# Patient Record
Sex: Male | Born: 1945 | Race: Black or African American | Hispanic: No | Marital: Single | Smoking: Former smoker
Health system: Southern US, Community
[De-identification: ages and names within clinical notes are randomized; demographics above are authoritative.]

## PROBLEM LIST (undated history)

## (undated) DIAGNOSIS — J439 Emphysema, unspecified: Secondary | ICD-10-CM

## (undated) DIAGNOSIS — R0609 Other forms of dyspnea: Secondary | ICD-10-CM

## (undated) DIAGNOSIS — K635 Polyp of colon: Secondary | ICD-10-CM

## (undated) DIAGNOSIS — I82409 Acute embolism and thrombosis of unspecified deep veins of unspecified lower extremity: Secondary | ICD-10-CM

## (undated) DIAGNOSIS — G4730 Sleep apnea, unspecified: Secondary | ICD-10-CM

## (undated) DIAGNOSIS — I10 Essential (primary) hypertension: Secondary | ICD-10-CM

## (undated) DIAGNOSIS — K219 Gastro-esophageal reflux disease without esophagitis: Secondary | ICD-10-CM

## (undated) DIAGNOSIS — M1990 Unspecified osteoarthritis, unspecified site: Secondary | ICD-10-CM

## (undated) DIAGNOSIS — R0600 Dyspnea, unspecified: Secondary | ICD-10-CM

## (undated) DIAGNOSIS — S3992XA Unspecified injury of lower back, initial encounter: Secondary | ICD-10-CM

## (undated) DIAGNOSIS — T148XXA Other injury of unspecified body region, initial encounter: Secondary | ICD-10-CM

## (undated) HISTORY — PX: HERNIA REPAIR: SHX51

## (undated) HISTORY — DX: Other forms of dyspnea: R06.09

## (undated) HISTORY — DX: Emphysema, unspecified: J43.9

## (undated) HISTORY — DX: Unspecified injury of lower back, initial encounter: S39.92XA

## (undated) HISTORY — DX: Polyp of colon: K63.5

## (undated) HISTORY — DX: Dyspnea, unspecified: R06.00

## (undated) HISTORY — DX: Gastro-esophageal reflux disease without esophagitis: K21.9

## (undated) HISTORY — PX: KNEE SURGERY: SHX244

## (undated) HISTORY — DX: Other injury of unspecified body region, initial encounter: T14.8XXA

## (undated) HISTORY — DX: Acute embolism and thrombosis of unspecified deep veins of unspecified lower extremity: I82.409

## (undated) HISTORY — PX: ROTATOR CUFF REPAIR: SHX139

## (undated) HISTORY — PX: NECK SURGERY: SHX720

---

## 2006-02-19 HISTORY — PX: COLONOSCOPY: SHX5424

## 2009-09-14 ENCOUNTER — Ambulatory Visit: Payer: Self-pay

## 2013-06-25 ENCOUNTER — Ambulatory Visit: Payer: Self-pay | Admitting: Family Medicine

## 2013-06-26 DIAGNOSIS — E669 Obesity, unspecified: Secondary | ICD-10-CM | POA: Insufficient documentation

## 2015-09-07 ENCOUNTER — Ambulatory Visit
Admission: RE | Admit: 2015-09-07 | Discharge: 2015-09-07 | Disposition: A | Discharge status: Home / Self Care | Payer: Medicare Other | Source: Ambulatory Visit | Attending: Internal Medicine | Admitting: Internal Medicine

## 2015-09-07 ENCOUNTER — Other Ambulatory Visit: Payer: Self-pay | Admitting: Internal Medicine

## 2015-09-07 DIAGNOSIS — T1490 Injury, unspecified: Secondary | ICD-10-CM | POA: Diagnosis not present

## 2015-09-07 DIAGNOSIS — IMO0001 Reserved for inherently not codable concepts without codable children: Secondary | ICD-10-CM

## 2015-09-07 DIAGNOSIS — M160 Bilateral primary osteoarthritis of hip: Secondary | ICD-10-CM | POA: Diagnosis not present

## 2015-09-07 DIAGNOSIS — X58XXXA Exposure to other specified factors, initial encounter: Secondary | ICD-10-CM | POA: Insufficient documentation

## 2016-01-20 ENCOUNTER — Ambulatory Visit
Admission: RE | Admit: 2016-01-20 | Discharge: 2016-01-20 | Disposition: A | Discharge status: Home / Self Care | Payer: Medicare Other | Source: Ambulatory Visit | Attending: Internal Medicine | Admitting: Internal Medicine

## 2016-01-20 ENCOUNTER — Other Ambulatory Visit: Payer: Self-pay | Admitting: Internal Medicine

## 2016-01-20 DIAGNOSIS — F17200 Nicotine dependence, unspecified, uncomplicated: Secondary | ICD-10-CM

## 2016-01-20 DIAGNOSIS — Z87891 Personal history of nicotine dependence: Secondary | ICD-10-CM | POA: Diagnosis present

## 2017-01-23 ENCOUNTER — Encounter: Payer: Self-pay | Admitting: *Deleted

## 2017-01-28 ENCOUNTER — Ambulatory Visit: Payer: Self-pay | Admitting: General Surgery

## 2017-01-30 ENCOUNTER — Encounter: Payer: Self-pay | Admitting: General Surgery

## 2017-01-30 ENCOUNTER — Ambulatory Visit (INDEPENDENT_AMBULATORY_CARE_PROVIDER_SITE_OTHER): Payer: Medicare Other | Admitting: General Surgery

## 2017-01-30 VITALS — BP 152/88 | HR 77 | Resp 14 | Ht 74.0 in | Wt 275.0 lb

## 2017-01-30 DIAGNOSIS — R1013 Epigastric pain: Secondary | ICD-10-CM | POA: Diagnosis not present

## 2017-01-30 DIAGNOSIS — R1319 Other dysphagia: Secondary | ICD-10-CM

## 2017-01-30 NOTE — Progress Notes (Signed)
Patient ID: Zachary Sweeney, male   DOB: [birth year], 71 y.o.   MRN: [id number]  Chief Complaint  Patient presents with  . Other    HPI Zachary Sweeney is a 71 y.o. male.  Here for evaluation dysphagia referred by Zachary Sweeney. He states that when he tries to eat food seems to get "stuck" which makes him panic. He does admit to "stabbing " pains cental upper abdomen, lasting 5-15 minutes. Not related to certain foods. Some sore throat and itching, "scratchy" as well. He states he coughs up a lot of clear mucous worse at night. Feels like a "hole" in stomach because it is "sore". The 4 episodes this year starting 3 months ago that has been with foods like beans and bread. No weight loss. He does cough on a daily basis but not related to meals. Short of breath with exertion, Zachary Sweeney tried inhalers. The patient reports that he began making use of Mobic, regular basis about the time that he began to develop his episodic abdominal pain. The patient was not able to pinpoint any particular foods or activities that may precipitate this sharp epigastric pain.  HPI  Past Medical History:  Diagnosis Date  . Back injury   . Colon polyp   . Torn ligament     Past Surgical History:  Procedure Laterality Date  . COLONOSCOPY  2008  . HERNIA REPAIR    . KNEE SURGERY Left   . NECK SURGERY    . ROTATOR CUFF REPAIR Right     Family History  Problem Relation Age of Onset  . Lung cancer Mother   . Heart disease Father   . Colon cancer Maternal Uncle     Social History Social History   Tobacco Use  . Smoking status: Former Smoker    Packs/day: 1.00    Years: 25.00    Pack years: 25.00    Types: Cigarettes    Last attempt to quit: 02/19/1994    Years since quitting: 22.9  . Smokeless tobacco: Never Used  Substance Use Topics  . Alcohol use: No    Frequency: Never  . Drug use: No    No Known Allergies  Current Outpatient Medications  Medication Sig Dispense Refill  . calcium carbonate  (TUMS - DOSED IN MG ELEMENTAL CALCIUM) 500 MG chewable tablet Chew 1 tablet by mouth as needed for indigestion or heartburn.    . diltiazem (CARDIZEM CD) 300 MG 24 hr capsule Take 300 mg by mouth daily.     . meloxicam (MOBIC) 15 MG tablet Take 15 mg by mouth daily.      No current facility-administered medications for this visit.     Review of Systems Review of Systems  Constitutional: Negative.   Respiratory: Positive for cough and shortness of breath.   Cardiovascular: Negative.   Gastrointestinal: Negative for constipation, diarrhea and nausea.    Blood pressure (!) 152/88, pulse 77, resp. rate 14, height 6\' 2"  (1.88 m), weight 275 lb (124.7 kg). The patient's weight is down 8 pounds from his September 2018 exam with his PCP.  Physical Exam Physical Exam  Constitutional: He is oriented to person, place, and time. He appears well-developed and well-nourished.  HENT:  Mouth/Throat: Oropharynx is clear and moist.  Eyes: Conjunctivae are normal. No scleral icterus.  Neck: Neck supple.  Cardiovascular: Normal rate, regular rhythm and normal heart sounds.  Pulses:      Femoral pulses are 2+ on the right side, and 2+ on  the left side. No lower leg edema  Pulmonary/Chest: Effort normal and breath sounds normal.  Abdominal: Soft. There is no tenderness.  Lymphadenopathy:    He has no cervical adenopathy.  Neurological: He is alert and oriented to person, place, and time.  Skin: Skin is warm and dry.  Psychiatric: His behavior is normal.    Data Reviewed Laboratory studies dated 11/05/2016 showed anonfasting blood sugar of 125, creatinine 1.26, estimated GFR 67, electrolytes notable for mild depression of the carbon dioxide 18. Hemoglobin A1c 5.5. PSA 3.1. HIV screen negative.  Office notes of 11/06/2006 report a weight of 283 pounds. No complaints of abdominal pain at that time. Trace lower extremity edema noted.  Colonoscopy of 07/17/2011 completed at [facility] identified 7 polyps  between the ascending colon and splenic flexure ranging between 4 and 6 mm in size. Diagnosis: A: Colon, ascending, biopsy -Adenomatous polyp (multiple fragments) -No high grade dysplasia identified  B: Colon, cecum, biopsy -Adenomatous polyp (multiple fragments) -No high grade dysplasia identified  C: Colon, sigmoid and rectum, biopsy -Hyperplastic polyp (3 fragments) -Lymphoid nodule (1 fragment)  CT of the abdomen dated 06/25/2013 showed evidence of sigmoid diverticulosis.  Assessment    Unexplained epigastric pain, esophageal spasm versus reflux versus ulcer.  Multiple colonic polyps in 2013. Follow-up exam would be due now.    Plan         Barium swallow and evaluate gall bladder  Consider upper and lower endoscopy based on the results of the above-mentioned studies.    HPI, Physical Exam, Assessment and Plan have been scribed under the direction and in the presence of Robert Bellow, MD. Karie Fetch, RN  I have completed the exam and reviewed the above documentation for accuracy and completeness.  I agree with the above.  Haematologist has been used and any errors in dictation or transcription are unintentional.  Hervey Ard, M.D., F.A.C.S.  Robert Bellow 01/31/2017, 10:04 PM  Patient has been scheduled for a right upper quadrant abdominal ultrasound and upper GI (no KUB or SBFT) at [facility] for 02-05-17 at 9:30 am (arrive 9:15 am). Prep: nothing to eat/drink 8 hours prior. This patient is aware of date, time, and instructions. He verbalizes understanding.   Dominga Ferry, CMA

## 2017-01-30 NOTE — Patient Instructions (Signed)
The patient is aware to call back for any questions or concerns.  

## 2017-01-31 DIAGNOSIS — R1013 Epigastric pain: Secondary | ICD-10-CM | POA: Insufficient documentation

## 2017-01-31 DIAGNOSIS — R1319 Other dysphagia: Secondary | ICD-10-CM | POA: Insufficient documentation

## 2017-01-31 DIAGNOSIS — R1310 Dysphagia, unspecified: Secondary | ICD-10-CM | POA: Insufficient documentation

## 2017-02-05 ENCOUNTER — Telehealth: Payer: Self-pay | Admitting: *Deleted

## 2017-02-05 ENCOUNTER — Ambulatory Visit
Admission: RE | Admit: 2017-02-05 | Discharge: 2017-02-05 | Disposition: A | Discharge status: Home / Self Care | Payer: Medicare Other | Source: Ambulatory Visit | Attending: General Surgery | Admitting: General Surgery

## 2017-02-05 DIAGNOSIS — K219 Gastro-esophageal reflux disease without esophagitis: Secondary | ICD-10-CM | POA: Diagnosis not present

## 2017-02-05 DIAGNOSIS — K760 Fatty (change of) liver, not elsewhere classified: Secondary | ICD-10-CM | POA: Insufficient documentation

## 2017-02-05 DIAGNOSIS — R1013 Epigastric pain: Secondary | ICD-10-CM | POA: Diagnosis present

## 2017-02-05 DIAGNOSIS — R1319 Other dysphagia: Secondary | ICD-10-CM | POA: Insufficient documentation

## 2017-02-05 DIAGNOSIS — K449 Diaphragmatic hernia without obstruction or gangrene: Secondary | ICD-10-CM | POA: Insufficient documentation

## 2017-02-05 NOTE — Telephone Encounter (Signed)
-----   Message from Robert Bellow, MD sent at 02/05/2017  2:15 PM EST -----  Please notify the patient the U/S was fine, but the UGI shows a mild narrowing in the esophagus. 2013 colonoscopy at [facility] showed multiple polyps.  I would recommend an upper endoscopy and possible stretch of the narrowed area. A colonoscopy would be due now based on his last exam.   These can be completed here or at [facility].  ----- Message ----- From: Interface, Rad Results In Sent: 02/05/2017  10:39 AM To: Robert Bellow, MD

## 2017-02-07 NOTE — Telephone Encounter (Signed)
Notified patient as instructed, patient pleased. He wishes to proceed with the upper endo and colonoscopy here with Dr Bary Castilla( he does not like driving to [facility] anymore if possible) . He is aware that we would be calling him after Christmas to arrange, pt agrees.

## 2017-02-20 ENCOUNTER — Other Ambulatory Visit: Payer: Self-pay | Admitting: General Surgery

## 2017-02-20 ENCOUNTER — Telehealth: Payer: Self-pay | Admitting: *Deleted

## 2017-02-20 DIAGNOSIS — R4702 Dysphasia: Secondary | ICD-10-CM

## 2017-02-20 MED ORDER — POLYETHYLENE GLYCOL 3350 17 GM/SCOOP PO POWD: ORAL | 0 refills | Status: DC

## 2017-02-20 NOTE — Telephone Encounter (Signed)
Patient has been scheduled for an upper and lower endoscopy on 02-27-17 at [facility]. The patient has been asked to only take diltiazem the morning of procedure at 6 am with a small sip of water. Miralax prescription has been sent in to the patient's pharmacy today. Colonoscopy instructions have been reviewed with the patient verbally. Also, mailed to the patient today.  This patient is aware to call the office if they have further questions.

## 2017-02-27 ENCOUNTER — Encounter: Payer: Self-pay | Admitting: Emergency Medicine

## 2017-02-27 ENCOUNTER — Ambulatory Visit: Payer: Medicare Other | Admitting: Anesthesiology

## 2017-02-27 ENCOUNTER — Ambulatory Visit
Admission: RE | Admit: 2017-02-27 | Discharge: 2017-02-27 | Disposition: A | Discharge status: Home / Self Care | Payer: Medicare Other | Source: Ambulatory Visit | Attending: General Surgery | Admitting: General Surgery

## 2017-02-27 ENCOUNTER — Encounter
Admission: RE | Disposition: A | Discharge status: Home / Self Care | Payer: Self-pay | Source: Ambulatory Visit | Attending: General Surgery

## 2017-02-27 DIAGNOSIS — K222 Esophageal obstruction: Secondary | ICD-10-CM | POA: Insufficient documentation

## 2017-02-27 DIAGNOSIS — D128 Benign neoplasm of rectum: Secondary | ICD-10-CM | POA: Diagnosis not present

## 2017-02-27 DIAGNOSIS — K449 Diaphragmatic hernia without obstruction or gangrene: Secondary | ICD-10-CM | POA: Diagnosis not present

## 2017-02-27 DIAGNOSIS — D120 Benign neoplasm of cecum: Secondary | ICD-10-CM | POA: Insufficient documentation

## 2017-02-27 DIAGNOSIS — Z1211 Encounter for screening for malignant neoplasm of colon: Secondary | ICD-10-CM | POA: Diagnosis not present

## 2017-02-27 DIAGNOSIS — R1310 Dysphagia, unspecified: Secondary | ICD-10-CM | POA: Diagnosis not present

## 2017-02-27 DIAGNOSIS — Z86010 Personal history of colonic polyps: Secondary | ICD-10-CM | POA: Diagnosis not present

## 2017-02-27 DIAGNOSIS — K621 Rectal polyp: Secondary | ICD-10-CM | POA: Diagnosis not present

## 2017-02-27 DIAGNOSIS — R4702 Dysphasia: Secondary | ICD-10-CM

## 2017-02-27 DIAGNOSIS — Z87891 Personal history of nicotine dependence: Secondary | ICD-10-CM | POA: Diagnosis not present

## 2017-02-27 HISTORY — PX: COLONOSCOPY WITH PROPOFOL: SHX5780

## 2017-02-27 HISTORY — PX: ESOPHAGOGASTRODUODENOSCOPY (EGD) WITH PROPOFOL: SHX5813

## 2017-02-27 SURGERY — ESOPHAGOGASTRODUODENOSCOPY (EGD) WITH PROPOFOL
Anesthesia: General

## 2017-02-27 MED ORDER — OMEPRAZOLE 40 MG PO CPDR: 40.0000 mg | DELAYED_RELEASE_CAPSULE | Freq: Every day | ORAL | 5 refills | Status: DC

## 2017-02-27 MED ORDER — PROPOFOL 500 MG/50ML IV EMUL
INTRAVENOUS | Status: AC
  Filled 2017-02-27: qty 50

## 2017-02-27 MED ORDER — PROPOFOL 500 MG/50ML IV EMUL
INTRAVENOUS | Status: DC | PRN
  Administered 2017-02-27: 125 ug/kg/min via INTRAVENOUS

## 2017-02-27 MED ORDER — PROPOFOL 10 MG/ML IV BOLUS
INTRAVENOUS | Status: DC | PRN
  Administered 2017-02-27: 70 mg via INTRAVENOUS

## 2017-02-27 MED ORDER — SODIUM CHLORIDE 0.9 % IV SOLN
INTRAVENOUS | Status: DC | PRN
  Administered 2017-02-27: 10:00:00 via INTRAVENOUS

## 2017-02-27 MED ORDER — LIDOCAINE HCL (CARDIAC) 20 MG/ML IV SOLN
INTRAVENOUS | Status: DC | PRN
  Administered 2017-02-27: 50 mg via INTRAVENOUS

## 2017-02-27 MED ORDER — SODIUM CHLORIDE 0.9 % IV SOLN
INTRAVENOUS | Status: DC
  Administered 2017-02-27: 1000 mL via INTRAVENOUS

## 2017-02-27 NOTE — Anesthesia Post-op Follow-up Note (Signed)
Anesthesia QCDR form completed.        

## 2017-02-27 NOTE — Op Note (Signed)
[facility] Gastroenterology Patient Name: Zachary Sweeney Procedure Date: 02/27/2017 9:22 AM MRN: [id number] Account #: [id number] Date of Birth: 05-23-1945 Admit Type: Outpatient Age: 72 Room: [facility] ENDO ROOM 1 Gender: Male Note Status: Finalized Procedure:            Colonoscopy Indications:          High risk colon cancer surveillance: Personal history                        of colonic polyps Providers:            Robert Bellow, MD Medicines:            Monitored Anesthesia Care Complications:        No immediate complications. Procedure:            Pre-Anesthesia Assessment:                       - Prior to the procedure, a History and Physical was                        performed, and patient medications, allergies and                        sensitivities were reviewed. The patient's tolerance of                        previous anesthesia was reviewed.                       - The risks and benefits of the procedure and the                        sedation options and risks were discussed with the                        patient. All questions were answered and informed                        consent was obtained.                       After obtaining informed consent, the colonoscope was                        passed under direct vision. Throughout the procedure,                        the patient's blood pressure, pulse, and oxygen                        saturations were monitored continuously. The                        Colonoscope was introduced through the anus and                        advanced to the the terminal ileum. The colonoscopy was                        performed without difficulty. The patient tolerated the  procedure well. The quality of the bowel preparation                        was adequate to identify polyps 6 mm and larger in size. Findings:      Two sessile polyps were found in the cecum. The polyps were 5 to  12 mm       in size. These polyps were removed with a hot snare. Resection and       retrieval were complete.      Two sessile polyps were found in the rectum (benign-appearing lesion).       The polyps were 6 to 8 mm in size. These polyps were removed with a cold       snare. Resection and retrieval were complete.      The retroflexed view of the distal rectum and anal verge was normal and       showed no anal or rectal abnormalities. Impression:           - Two 5 to 12 mm polyps in the cecum, removed with a                        hot snare. Resected and retrieved.                       - Two benign appearing 6 to 8 mm polyps in the rectum,                        removed with a cold snare. Resected and retrieved.                       - The distal rectum and anal verge are normal on                        retroflexion view. Recommendation:       - Return to GI clinic in 2 weeks. Procedure Code(s):    --- Professional ---                       [phone], Colonoscopy, flexible; with removal of tumor(s),                        polyp(s), or other lesion(s) by snare technique Diagnosis Code(s):    --- Professional ---                       K62.1, Rectal polyp                       D12.0, Benign neoplasm of cecum                       Z86.010, Personal history of colonic polyps CPT copyright 2016 American Medical Association. All rights reserved. The codes documented in this report are preliminary and upon coder review may  be revised to meet current compliance requirements. Robert Bellow, MD 02/27/2017 10:38:12 AM This report has been signed electronically. Number of Addenda: 0 Note Initiated On: 02/27/2017 9:22 AM Scope Withdrawal Time: 0 hours 26 minutes 14 seconds  Total Procedure Duration: 0 hours 34 minutes 52 seconds       [facility]

## 2017-02-27 NOTE — H&P (Signed)
Zachary Sweeney [id number] 1945-12-29     HPI: 72 year old male with past history of colonic polyps of the ascending colon and cecum identified in 2013.  More recent history of dysphasia, improved from his December exam.  Admitted for upper and lower endoscopy.  Patient reports tolerating the prep well.  Medications Prior to Admission  Medication Sig Dispense Refill Last Dose  . calcium carbonate (TUMS - DOSED IN MG ELEMENTAL CALCIUM) 500 MG chewable tablet Chew 1 tablet by mouth as needed for indigestion or heartburn.   02/26/2017 at Unknown time  . diltiazem (CARDIZEM CD) 300 MG 24 hr capsule Take 300 mg by mouth daily.    02/27/2017 at Unknown time  . meloxicam (MOBIC) 15 MG tablet Take 15 mg by mouth daily.    02/26/2017 at Unknown time  . polyethylene glycol powder (GLYCOLAX/MIRALAX) powder 255 grams one bottle for colonoscopy prep 255 g 0 02/26/2017 at Unknown time   No Known Allergies Past Medical History:  Diagnosis Date  . Back injury   . Colon polyp   . Torn ligament    Past Surgical History:  Procedure Laterality Date  . COLONOSCOPY  2008  . HERNIA REPAIR    . KNEE SURGERY Left   . NECK SURGERY    . ROTATOR CUFF REPAIR Right    Social History   Socioeconomic History  . Marital status: Married    Spouse name: Not on file  . Number of children: Not on file  . Years of education: Not on file  . Highest education level: Not on file  Social Needs  . Financial resource strain: Not on file  . Food insecurity - worry: Not on file  . Food insecurity - inability: Not on file  . Transportation needs - medical: Not on file  . Transportation needs - non-medical: Not on file  Occupational History  . Not on file  Tobacco Use  . Smoking status: Former Smoker    Packs/day: 1.00    Years: 25.00    Pack years: 25.00    Types: Cigarettes    Last attempt to quit: 02/19/1994    Years since quitting: 23.0  . Smokeless tobacco: Never Used  Substance and Sexual Activity  . Alcohol  use: No    Frequency: Never  . Drug use: No  . Sexual activity: Not on file  Other Topics Concern  . Not on file  Social History Narrative  . Not on file   Social History   Social History Narrative  . Not on file     ROS: Negative.     PE: HEENT: Negative. Lungs: Clear. Cardio: RR.  Assessment/Plan:  Proceed with planned upper and lower endoscopy. Robert Bellow 02/27/2017

## 2017-02-27 NOTE — Progress Notes (Signed)
The patient will be placed on Prilosec, 40 mg daily and will be reassessed in 2 weeks.

## 2017-02-27 NOTE — Anesthesia Preprocedure Evaluation (Signed)
Anesthesia Evaluation  Patient identified by MRN, date of birth, ID band Patient awake    Reviewed: Allergy & Precautions, NPO status , Patient's Chart, lab work & pertinent test results  History of Anesthesia Complications Negative for: history of anesthetic complications  Airway Mallampati: III  TM Distance: >3 FB Neck ROM: Full    Dental  (+) Edentulous Upper, Partial Lower   Pulmonary neg sleep apnea, neg COPD, former smoker,    breath sounds clear to auscultation- rhonchi (-) wheezing      Cardiovascular Exercise Tolerance: Good hypertension, (-) CAD, (-) Past MI, (-) Cardiac Stents and (-) CABG  Rhythm:Regular Rate:Normal - Systolic murmurs and - Diastolic murmurs    Neuro/Psych negative neurological ROS  negative psych ROS   GI/Hepatic negative GI ROS, Neg liver ROS,   Endo/Other  negative endocrine ROSneg diabetes  Renal/GU negative Renal ROS     Musculoskeletal negative musculoskeletal ROS (+)   Abdominal (+) + obese,   Peds  Hematology negative hematology ROS (+)   Anesthesia Other Findings Past Medical History: No date: Back injury No date: Colon polyp No date: Torn ligament   Reproductive/Obstetrics                             Anesthesia Physical Anesthesia Plan  ASA: II  Anesthesia Plan: General   Post-op Pain Management:    Induction: Intravenous  PONV Risk Score and Plan: 1 and Propofol infusion  Airway Management Planned: Natural Airway  Additional Equipment:   Intra-op Plan:   Post-operative Plan:   Informed Consent: I have reviewed the patients History and Physical, chart, labs and discussed the procedure including the risks, benefits and alternatives for the proposed anesthesia with the patient or authorized representative who has indicated his/her understanding and acceptance.   Dental advisory given  Plan Discussed with: CRNA and  Anesthesiologist  Anesthesia Plan Comments:         Anesthesia Quick Evaluation

## 2017-02-27 NOTE — Op Note (Signed)
[facility] Gastroenterology Patient Name: Zachary Sweeney Procedure Date: 02/27/2017 9:22 AM MRN: [id number] Account #: [id number] Date of Birth: 1945/12/16 Admit Type: Outpatient Age: 72 Room: [facility] ENDO ROOM 1 Gender: Male Note Status: Finalized Procedure:            Upper GI endoscopy Indications:          Dysphagia Providers:            Robert Bellow, MD Referring MD:         Mikeal Hawthorne. Brynda Greathouse MD, MD (Referring MD) Medicines:            Monitored Anesthesia Care Complications:        No immediate complications. Procedure:            Pre-Anesthesia Assessment:                       - Prior to the procedure, a History and Physical was                        performed, and patient medications, allergies and                        sensitivities were reviewed. The patient's tolerance of                        previous anesthesia was reviewed.                       - The risks and benefits of the procedure and the                        sedation options and risks were discussed with the                        patient. All questions were answered and informed                        consent was obtained.                       - Patient identification and proposed procedure were                        verified prior to the procedure by the physician.                       After obtaining informed consent, the endoscope was                        passed under direct vision. Throughout the procedure,                        the patient's blood pressure, pulse, and oxygen                        saturations were monitored continuously. The Endoscope                        was introduced through the mouth, and advanced to the  second part of duodenum. The upper GI endoscopy was                        accomplished without difficulty. The patient tolerated                        the procedure well. Findings:      A medium-sized hiatal hernia was  present.      A non-obstructing Schatzki ring (acquired) was found at the       gastroesophageal junction. Biopsies were taken with a cold forceps for       histology.      The stomach was normal.      The examined duodenum was normal. Impression:           - Medium-sized hiatal hernia.                       - Non-obstructing Schatzki ring. Biopsied.                       - Normal stomach.                       - Normal examined duodenum. Recommendation:       - Return to endoscopist in 2 weeks. Procedure Code(s):    --- Professional ---                       [phone], Esophagogastroduodenoscopy, flexible, transoral;                        with biopsy, single or multiple Diagnosis Code(s):    --- Professional ---                       K44.9, Diaphragmatic hernia without obstruction or                        gangrene                       R13.10, Dysphagia, unspecified CPT copyright 2016 American Medical Association. All rights reserved. The codes documented in this report are preliminary and upon coder review may  be revised to meet current compliance requirements. Robert Bellow, MD 02/27/2017 10:42:03 AM This report has been signed electronically. Number of Addenda: 0 Note Initiated On: 02/27/2017 9:22 AM      [facility]

## 2017-02-27 NOTE — Anesthesia Postprocedure Evaluation (Signed)
Anesthesia Post Note  Patient: Zachary Sweeney  Procedure(s) Performed: ESOPHAGOGASTRODUODENOSCOPY (EGD) WITH PROPOFOL (N/A ) COLONOSCOPY WITH PROPOFOL (N/A )  Patient location during evaluation: Endoscopy Anesthesia Type: General Level of consciousness: awake and alert and oriented Pain management: pain level controlled Vital Signs Assessment: post-procedure vital signs reviewed and stable Respiratory status: spontaneous breathing, nonlabored ventilation and respiratory function stable Cardiovascular status: blood pressure returned to baseline and stable Postop Assessment: no signs of nausea or vomiting Anesthetic complications: no     Last Vitals:  Vitals:   02/27/17 1101 02/27/17 1105  BP: (!) 116/104 116/76  Pulse: 79 75  Resp: 17 15  Temp:    SpO2: 99% 98%    Last Pain:  Vitals:   02/27/17 1101  TempSrc:   PainSc: 0-No pain                 Breanda Greenlaw

## 2017-02-27 NOTE — Transfer of Care (Signed)
Immediate Anesthesia Transfer of Care Note  Patient: Zachary Sweeney  Procedure(s) Performed: ESOPHAGOGASTRODUODENOSCOPY (EGD) WITH PROPOFOL (N/A ) COLONOSCOPY WITH PROPOFOL (N/A )  Patient Location: Endoscopy Unit  Anesthesia Type:General  Level of Consciousness: sedated  Airway & Oxygen Therapy: Patient Spontanous Breathing and Patient connected to nasal cannula oxygen  Post-op Assessment: Report given to RN and Post -op Vital signs reviewed and stable  Post vital signs: Reviewed and stable  Last Vitals:  Vitals:   02/27/17 0921 02/27/17 1041  BP: (!) 152/108   Pulse: 88   Resp: (!) 24   Temp: (!) 35.7 C (!) (P) 36.1 C  SpO2: 98%     Last Pain:  Vitals:   02/27/17 0921  TempSrc: Tympanic         Complications: No apparent anesthesia complications

## 2017-02-28 ENCOUNTER — Encounter: Payer: Self-pay | Admitting: General Surgery

## 2017-03-01 ENCOUNTER — Telehealth: Payer: Self-pay | Admitting: General Surgery

## 2017-03-01 LAB — SURGICAL PATHOLOGY

## 2017-03-01 NOTE — Telephone Encounter (Signed)
3 tubular adenomas in the colon.  Biopsies of the GE junction where the most pronounced inflammation showed eosinophilic changes.  Likely related to reflux.  Patient started on PPI.  We will follow his clinical response.  Repeat colonoscopy in 5 years.

## 2017-06-24 NOTE — Progress Notes (Addendum)
[facility] Consultation      Assessment and Plan:  Dyspnea on exertion. - With excess mucus production, and history of smoking may be indicative of COPD. - We will start empirically on Spiriva inhaler, sent patient for chest x-ray and pulmonary function testing.  Chest pain. - Occasional chest pain, though atypical and can sometimes occur at rest as well as with activity. - We will refer to cardiology as soon as possible.  GERD. - Continue omeprazole.  Right lower extremity edema. - We will obtain lower extremity ultrasound to rule out DVT.  Excessive daytime sleepiness. -Poor sleep hygiene, with symptoms and signs of obstructive sleep apnea, will send for sleep study.  Meds ordered this encounter  Medications  . tiotropium (SPIRIVA HANDIHALER) 18 MCG inhalation capsule    Sig: Place 1 capsule (18 mcg total) into inhaler and inhale daily.    Dispense:  30 capsule    Refill:  2   Orders Placed This Encounter  Procedures  . DG Chest 2 View  . US Venous Img Lower Bilateral  . Pulmonary Function Test [facility] Only  . Split night study   Return in about 2 months (around 08/25/2017).  Addendum 06/26/2017 Noted to have a positive DVT, appeared subacute.  Was sent to the ED and started on Eliquis.  Given his dyspnea on exertion, will send for CT chest to rule out pulmonary embolism.  Date: 06/25/2017  MRN# [id number] WAI LITT 1945-10-30   Zachary Sweeney is a 72 y.o. old male seen in consultation for chief complaint of:    Chief Complaint  Patient presents with  . Consult    referred by [facility] for eval of sob    HPI:   He notes that he has trouble breathing on exertion, occasional chest pain, and pain in his legs. He has a yard  About flat 200 square feet that he mows and this year he can mow only half. He runs out of breath when he does things.  He has never been diagnosed with copd or asthma. He is on no inhalers  currently. He is not smoking, he has minimal to no cough, and he denies excess mucus production.  He has reflux which is controlled with prilosec.  He has irregular sleep habits, goes to bed at 1am, but only falls asleep at 5, wakes at 9am. Then is up and takes a 2 hour nap in the afternoon.    Imaging personally reviewed, 01/20/2016; some mildly increased interstitial markings bilaterally, otherwise normal lungs.   PMHX:   Past Medical History:  Diagnosis Date  . Back injury   . Colon polyp   . Torn ligament    Surgical Hx:  Past Surgical History:  Procedure Laterality Date  . COLONOSCOPY  2008  . COLONOSCOPY WITH PROPOFOL N/A 02/27/2017   Procedure: COLONOSCOPY WITH PROPOFOL;  Surgeon: Robert Bellow, MD;  Location: [facility] ENDOSCOPY;  Service: Endoscopy;  Laterality: N/A;  . ESOPHAGOGASTRODUODENOSCOPY (EGD) WITH PROPOFOL N/A 02/27/2017   Procedure: ESOPHAGOGASTRODUODENOSCOPY (EGD) WITH PROPOFOL;  Surgeon: Robert Bellow, MD;  Location: [facility] ENDOSCOPY;  Service: Endoscopy;  Laterality: N/A;  . HERNIA REPAIR    . KNEE SURGERY Left   . NECK SURGERY    . ROTATOR CUFF REPAIR Right    Family Hx:  Family History  Problem Relation Age of Onset  . Lung cancer Mother   . Heart disease Father   . Colon cancer Maternal Uncle  Social Hx:   Social History   Tobacco Use  . Smoking status: Former Smoker    Packs/day: 1.00    Years: 25.00    Pack years: 25.00    Types: Cigarettes    Last attempt to quit: 02/19/1994    Years since quitting: 23.3  . Smokeless tobacco: Never Used  Substance Use Topics  . Alcohol use: No    Frequency: Never  . Drug use: No   Medication:    Current Outpatient Medications:  .  diltiazem (CARDIZEM CD) 300 MG 24 hr capsule, Take 300 mg by mouth daily. , Disp: , Rfl:  .  meloxicam (MOBIC) 15 MG tablet, Take 15 mg by mouth daily. , Disp: , Rfl:  .  omeprazole (PRILOSEC) 40 MG capsule, Take 1 capsule (40 mg total) by mouth daily., Disp: 30 capsule,  Rfl: 5   Allergies:  Patient has no known allergies.  Review of Systems: Gen:  Denies  fever, sweats, chills HEENT: Denies blurred vision, double vision. bleeds, sore throat Cvc:  No dizziness, chest pain. Resp:   Denies cough or sputum production, shortness of breath Gi: Denies swallowing difficulty, stomach pain. Gu:  Denies bladder incontinence, burning urine Ext:   No Joint pain, stiffness. Skin: No skin rash,  hives  Endoc:  No polyuria, polydipsia. Psych: No depression, insomnia. Other:  All other systems were reviewed with the patient and were negative other that what is mentioned in the HPI.   Physical Examination:   VS: BP 132/88 (BP Location: Left Arm, Cuff Size: Large)   Pulse (!) 53   Ht 5\' 11"  (1.803 m)   SpO2 97%   BMI 36.96 kg/m   General Appearance: No distress  Neuro:without focal findings,  speech normal,  HEENT: PERRLA, EOM intact.   Pulmonary: normal breath sounds, No wheezing.  CardiovascularNormal S1,S2.  No m/r/g.   Abdomen: Benign, Soft, non-tender. Renal:  No costovertebral tenderness  GU:  No performed at this time. Endoc: No evident thyromegaly, no signs of acromegaly. Skin:   warm, no rashes, no ecchymosis  Extremities: normal, no cyanosis, clubbing.  1-2+ right lower extremity edema.  No erythema or pain.  Other findings:    LABORATORY PANEL:   CBC No results for input(s): WBC, HGB, HCT, PLT in the last 168 hours. ------------------------------------------------------------------------------------------------------------------  Chemistries  No results for input(s): NA, K, CL, CO2, GLUCOSE, BUN, CREATININE, CALCIUM, MG, AST, ALT, ALKPHOS, BILITOT in the last 168 hours.  Invalid input(s): GFRCGP ------------------------------------------------------------------------------------------------------------------  Cardiac Enzymes No results for input(s): TROPONINI in the last 168  hours. ------------------------------------------------------------  RADIOLOGY:  No results found.     Thank  you for the consultation and for allowing [facility] to assist in the care of your patient. Our recommendations are noted above.  Please contact us if we can be of further service.   Marda Stalker, MD.  Board Certified in Internal Medicine, Pulmonary Medicine, [place], and Sleep Medicine.  [facility] Office Number: [phone]  Patricia Pesa, M.D.  Merton Border, M.D  06/25/2017

## 2017-06-25 ENCOUNTER — Emergency Department
Admission: EM | Admit: 2017-06-25 | Discharge: 2017-06-25 | Disposition: A | Discharge status: Home / Self Care | Payer: Medicare Other | Attending: Emergency Medicine | Admitting: Emergency Medicine

## 2017-06-25 ENCOUNTER — Ambulatory Visit
Admission: RE | Admit: 2017-06-25 | Discharge: 2017-06-25 | Disposition: A | Discharge status: Home / Self Care | Payer: Medicare Other | Source: Ambulatory Visit | Attending: Internal Medicine | Admitting: Internal Medicine

## 2017-06-25 ENCOUNTER — Encounter: Payer: Self-pay | Admitting: Internal Medicine

## 2017-06-25 ENCOUNTER — Encounter: Payer: Self-pay | Admitting: Emergency Medicine

## 2017-06-25 ENCOUNTER — Ambulatory Visit (INDEPENDENT_AMBULATORY_CARE_PROVIDER_SITE_OTHER): Payer: Medicare Other | Admitting: Internal Medicine

## 2017-06-25 ENCOUNTER — Other Ambulatory Visit: Payer: Self-pay

## 2017-06-25 ENCOUNTER — Telehealth: Payer: Self-pay | Admitting: *Deleted

## 2017-06-25 VITALS — BP 132/88 | HR 53 | Ht 71.0 in

## 2017-06-25 DIAGNOSIS — R0609 Other forms of dyspnea: Secondary | ICD-10-CM | POA: Diagnosis not present

## 2017-06-25 DIAGNOSIS — R079 Chest pain, unspecified: Secondary | ICD-10-CM

## 2017-06-25 DIAGNOSIS — Z87891 Personal history of nicotine dependence: Secondary | ICD-10-CM | POA: Diagnosis not present

## 2017-06-25 DIAGNOSIS — R600 Localized edema: Secondary | ICD-10-CM

## 2017-06-25 DIAGNOSIS — I82431 Acute embolism and thrombosis of right popliteal vein: Secondary | ICD-10-CM

## 2017-06-25 DIAGNOSIS — N400 Enlarged prostate without lower urinary tract symptoms: Secondary | ICD-10-CM | POA: Insufficient documentation

## 2017-06-25 DIAGNOSIS — R2241 Localized swelling, mass and lump, right lower limb: Secondary | ICD-10-CM | POA: Diagnosis present

## 2017-06-25 DIAGNOSIS — I82491 Acute embolism and thrombosis of other specified deep vein of right lower extremity: Secondary | ICD-10-CM | POA: Diagnosis not present

## 2017-06-25 DIAGNOSIS — M7989 Other specified soft tissue disorders: Secondary | ICD-10-CM | POA: Insufficient documentation

## 2017-06-25 DIAGNOSIS — G4719 Other hypersomnia: Secondary | ICD-10-CM

## 2017-06-25 DIAGNOSIS — R0600 Dyspnea, unspecified: Secondary | ICD-10-CM

## 2017-06-25 DIAGNOSIS — I10 Essential (primary) hypertension: Secondary | ICD-10-CM | POA: Diagnosis not present

## 2017-06-25 LAB — BASIC METABOLIC PANEL
ANION GAP: 7 (ref 5–15)
BUN: 14 mg/dL (ref 6–20)
CO2: 27 mmol/L (ref 22–32)
Calcium: 9.3 mg/dL (ref 8.9–10.3)
Chloride: 102 mmol/L (ref 101–111)
Creatinine, Ser: 1.13 mg/dL (ref 0.61–1.24)
GFR calc Af Amer: >60 mL/min (ref >60)
GFR calc non Af Amer: >60 mL/min (ref >60)
GLUCOSE: 90 mg/dL (ref 65–99)
Potassium: 4.1 mmol/L (ref 3.5–5.1)
Sodium: 136 mmol/L (ref 135–145)

## 2017-06-25 LAB — CBC WITH DIFFERENTIAL/PLATELET
BASOS ABS: 0 10*3/uL (ref 0–0.1)
Basophils Relative: 0 %
Eosinophils Absolute: 0.2 10*3/uL (ref 0–0.7)
Eosinophils Relative: 3 %
HEMATOCRIT: 50.9 % (ref 40.0–52.0)
Hemoglobin: 17.4 g/dL (ref 13.0–18.0)
LYMPHS PCT: 31 %
Lymphs Abs: 2.3 10*3/uL (ref 1.0–3.6)
MCH: 30.5 pg (ref 26.0–34.0)
MCHC: 34.1 g/dL (ref 32.0–36.0)
MCV: 89.3 fL (ref 80.0–100.0)
MONO ABS: 0.5 10*3/uL (ref 0.2–1.0)
Monocytes Relative: 7 %
NEUTROS ABS: 4.3 10*3/uL (ref 1.4–6.5)
Neutrophils Relative %: 59 %
Platelets: 137 10*3/uL — ABNORMAL LOW (ref 150–440)
RBC: 5.7 MIL/uL (ref 4.40–5.90)
RDW: 14.1 % (ref 11.5–14.5)
WBC: 7.4 10*3/uL (ref 3.8–10.6)

## 2017-06-25 LAB — APTT: aPTT: 30 seconds (ref 24–36)

## 2017-06-25 MED ORDER — APIXABAN 5 MG PO TABS
10.0000 mg | ORAL_TABLET | Freq: Two times a day (BID) | ORAL | Status: DC
  Administered 2017-06-25: 10 mg via ORAL

## 2017-06-25 MED ORDER — ELIQUIS 5 MG VTE STARTER PACK: ORAL_TABLET | ORAL | 0 refills | Status: DC

## 2017-06-25 MED ORDER — TIOTROPIUM BROMIDE MONOHYDRATE 18 MCG IN CAPS: 18.0000 ug | ORAL_CAPSULE | Freq: Every day | RESPIRATORY_TRACT | 2 refills | Status: DC

## 2017-06-25 MED ORDER — APIXABAN 5 MG PO TABS
ORAL_TABLET | ORAL | Status: AC
  Filled 2017-06-25: qty 2

## 2017-06-25 NOTE — ED Triage Notes (Signed)
Pt reports that he has pain in his right foot. He went to get an US done today and they told him that he had blood clot.

## 2017-06-25 NOTE — ED Provider Notes (Signed)
[facility] Emergency Department Provider Note  ____________________________________________   First MD Initiated Contact with Patient 06/25/17 1916     (approximate)  I have reviewed the triage vital signs and the nursing notes.   HISTORY  Chief Complaint blood clot in right leg   HPI Zachary WAMSER is a 72 y.o. male with his history of chronic right lower extremity pain and swelling who is presenting to the emergency department with a blood clot to the right lower extremity.  He says that he finally had an ultrasound today which showed a right popliteal DVT.  He has now presented to the emergency department for further evaluation and treatment.  He says the symptoms have been ongoing for months to years.  Is not reporting any chest pain or shortness of breath at this time.  However, he says that he has had progressively worsening shortness of breath with exertion over the past several years.  However, no sudden worsening over the past several days.  Past Medical History:  Diagnosis Date  . Back injury   . Colon polyp   . Torn ligament     Patient Active Problem List   Diagnosis Date Noted  . Benign prostatic hyperplasia 06/25/2017  . Hypertension 06/25/2017  . Other dysphagia 01/31/2017  . Epigastric pain 01/31/2017  . Obesity, unspecified 06/26/2013    Past Surgical History:  Procedure Laterality Date  . COLONOSCOPY  2008  . COLONOSCOPY WITH PROPOFOL N/A 02/27/2017   Procedure: COLONOSCOPY WITH PROPOFOL;  Surgeon: Robert Bellow, MD;  Location: [facility] ENDOSCOPY;  Service: Endoscopy;  Laterality: N/A;  . ESOPHAGOGASTRODUODENOSCOPY (EGD) WITH PROPOFOL N/A 02/27/2017   Procedure: ESOPHAGOGASTRODUODENOSCOPY (EGD) WITH PROPOFOL;  Surgeon: Robert Bellow, MD;  Location: [facility] ENDOSCOPY;  Service: Endoscopy;  Laterality: N/A;  . HERNIA REPAIR    . KNEE SURGERY Left   . NECK SURGERY    . ROTATOR CUFF REPAIR Right     Prior to Admission  medications   Medication Sig Start Date End Date Taking? Authorizing Provider  diltiazem (CARDIZEM CD) 300 MG 24 hr capsule Take 300 mg by mouth daily.  01/16/17   [provider]  meloxicam (MOBIC) 15 MG tablet Take 15 mg by mouth daily.  02/22/14   [provider]  omeprazole (PRILOSEC) 40 MG capsule Take 1 capsule (40 mg total) by mouth daily. 02/27/17 02/27/18  Robert Bellow, MD  tiotropium (SPIRIVA HANDIHALER) 18 MCG inhalation capsule Place 1 capsule (18 mcg total) into inhaler and inhale daily. 06/25/17 06/25/18  Laverle Hobby, MD    Allergies Patient has no known allergies.  Family History  Problem Relation Age of Onset  . Lung cancer Mother   . Heart disease Father   . Colon cancer Maternal Uncle     Social History Social History   Tobacco Use  . Smoking status: Former Smoker    Packs/day: 1.00    Years: 25.00    Pack years: 25.00    Types: Cigarettes    Last attempt to quit: 02/19/1994    Years since quitting: 23.3  . Smokeless tobacco: Never Used  Substance Use Topics  . Alcohol use: No    Frequency: Never  . Drug use: No    Review of Systems  Constitutional: No fever/chills Eyes: No visual changes. ENT: No sore throat. Cardiovascular: As above Respiratory: As above Gastrointestinal: No abdominal pain.  No nausea, no vomiting.  No diarrhea.  No constipation. Genitourinary: Negative for dysuria. Musculoskeletal: Negative for  back pain. Skin: Negative for rash. Neurological: Negative for headaches, focal weakness or numbness.   ____________________________________________   PHYSICAL EXAM:  VITAL SIGNS: ED Triage Vitals  Enc Vitals Group     BP 06/25/17 1706 137/80     Pulse Rate 06/25/17 1706 (!) 56     Resp 06/25/17 1706 20     Temp 06/25/17 1706 98.2 F (36.8 C)     Temp Source 06/25/17 1706 Oral     SpO2 06/25/17 1706 98 %     Weight 06/25/17 1707 280 lb (127 kg)     Height 06/25/17 1707 6\' 2"  (1.88 m)     Head  Circumference --      Peak Flow --      Pain Score 06/25/17 1706 7     Pain Loc --      Pain Edu? --      Excl. in [place]? --     Constitutional: Alert and oriented. Well appearing and in no acute distress. Eyes: Conjunctivae are normal.  Head: Atraumatic. Nose: No congestion/rhinnorhea. Mouth/Throat: Mucous membranes are moist.  Neck: No stridor.   Cardiovascular: Normal rate, regular rhythm. Grossly normal heart sounds.  Good peripheral circulation. Respiratory: Normal respiratory effort.  No retractions. Lungs CTAB. Gastrointestinal: Soft and nontender. No distention.  Musculoskeletal: Moderate right lower extremity edema to the right proximal foot as well as the calf.  Bilateral dorsalis pedis pulses are present and equal. Neurologic:  Normal speech and language. No gross focal neurologic deficits are appreciated. Skin:  Skin is warm, dry and intact. No rash noted. Psychiatric: Mood and affect are normal. Speech and behavior are normal.  ____________________________________________   LABS (all labs ordered are listed, but only abnormal results are displayed)  Labs Reviewed  CBC WITH DIFFERENTIAL/PLATELET - Abnormal; Notable for the following components:      Result Value   Platelets 137 (*)    All other components within normal limits  BASIC METABOLIC PANEL  APTT   ____________________________________________  EKG   ____________________________________________  RADIOLOGY  Subacute right popliteal DVT. ____________________________________________   PROCEDURES  Procedure(s) performed:   Procedures  Critical Care performed:   ____________________________________________   INITIAL IMPRESSION / ASSESSMENT AND PLAN / ED COURSE  Pertinent labs & imaging results that were available during my care of the patient were reviewed by me and considered in my medical decision making (see chart for details).  DDX: Peripheral edema, DVT, venous stasis, cellulitis As  part of my medical decision making, I reviewed the following data within the [place] Notes from prior outpatient visits.  ----------------------------------------- 8:24 PM on 06/25/2017 -----------------------------------------  Discussed case with Dr. Lucky Cowboy of vascular surgery because of the subacute nature of the symptoms as well as the findings.  However, Dr. Lucky Cowboy recommends treatment with Eliquis.  The patient has not had treatment thus far with blood thinners.  The patient is aware of the need for the blood thinners as well as the side effects including gastrointestinal bleeding as well as the need to come to the hospital immediately for any trauma.  Patient denies any current bleeding in his stool or any past history of hemorrhage.  He is understanding of the diagnosis as well as treatment plan willing to comply.  He is also understanding of the plan to stop his meloxicam. ____________________________________________   FINAL CLINICAL IMPRESSION(S) / ED DIAGNOSES  Right lower extremity DVT.    NEW MEDICATIONS STARTED DURING THIS VISIT:  New Prescriptions   No  medications on file     Note:  This document was prepared using Dragon voice recognition software and may include unintentional dictation errors.     Orbie Pyo, MD 06/25/17 2025

## 2017-06-25 NOTE — ED Notes (Signed)
Pt discharged to home.  Family member driving.  Discharge instructions reviewed.  Verbalized understanding.  No questions or concerns at this time.  Teach back verified.  Pt in NAD.  No items left in ED.   

## 2017-06-25 NOTE — ED Notes (Signed)
Pt states he has been having pain to R foot with increased pain with walking.  Pt states he has [place] and was dx with blood clot.  Pt is A&Ox4, in NAD.  Marked x on pt's R foot where pulse found.

## 2017-06-25 NOTE — Telephone Encounter (Signed)
Spoke with Vicente Males in radiology who states pt is positive for partially occlusive DVT in the popliteal vein. It has a subacute appearance. DR informed and states to send pt to ER. Informed Vicente Males in radiology to take pt to ER. Nothing further needed.

## 2017-06-25 NOTE — Patient Instructions (Addendum)
Will send you for a lung function test, chest x-ray, lower extremity ultrasound, and sleep study.   Will refer you to cardiology for chest pain asap.

## 2017-06-26 ENCOUNTER — Telehealth: Payer: Self-pay | Admitting: Internal Medicine

## 2017-06-26 ENCOUNTER — Ambulatory Visit: Payer: Medicare Other

## 2017-06-26 ENCOUNTER — Ambulatory Visit: Payer: Medicare Other | Admitting: Internal Medicine

## 2017-06-26 MED ORDER — APIXABAN 5 MG PO TABS: 5.0000 mg | ORAL_TABLET | Freq: Two times a day (BID) | ORAL | 3 refills | Status: DC

## 2017-06-26 MED ORDER — APIXABAN 5 MG PO TABS: 5.0000 mg | ORAL_TABLET | Freq: Two times a day (BID) | ORAL | 0 refills | Status: DC

## 2017-06-26 NOTE — Telephone Encounter (Signed)
Samples given for Elliquis 5mg . Patient contacted for pick-up.

## 2017-06-26 NOTE — Addendum Note (Signed)
Addended by: Laverle Hobby on: 06/26/2017 08:54 AM   Modules accepted: Orders

## 2017-06-26 NOTE — Telephone Encounter (Signed)
RWE:[id number] EXP 3/21 SAMPLES FROM CARDIOLOGY

## 2017-06-26 NOTE — Telephone Encounter (Signed)
Pt calling stating he was released from ED late last night and was not able to get to his pharmacy in time. This morning he went to fill the blood thinner and they told him they would not have the prescription in until the morning  He is concerned for the doctor in ED told him to take a dose today  Would like a call back to see if hospital Pharmacy would have it Or if we have samples   Please call back

## 2017-06-27 ENCOUNTER — Ambulatory Visit: Admission: RE | Admit: 2017-06-27 | Payer: Medicare Other | Source: Ambulatory Visit

## 2017-06-28 ENCOUNTER — Ambulatory Visit
Admission: RE | Admit: 2017-06-28 | Discharge: 2017-06-28 | Disposition: A | Discharge status: Home / Self Care | Payer: Medicare Other | Source: Ambulatory Visit | Attending: Internal Medicine | Admitting: Internal Medicine

## 2017-06-28 DIAGNOSIS — R079 Chest pain, unspecified: Secondary | ICD-10-CM | POA: Diagnosis present

## 2017-06-28 DIAGNOSIS — J432 Centrilobular emphysema: Secondary | ICD-10-CM | POA: Insufficient documentation

## 2017-06-28 DIAGNOSIS — J438 Other emphysema: Secondary | ICD-10-CM | POA: Insufficient documentation

## 2017-06-28 DIAGNOSIS — R0609 Other forms of dyspnea: Secondary | ICD-10-CM | POA: Diagnosis present

## 2017-06-28 HISTORY — DX: Essential (primary) hypertension: I10

## 2017-06-28 MED ORDER — IOHEXOL 350 MG/ML SOLN
75.0000 mL | Freq: Once | INTRAVENOUS | Status: AC | PRN
  Administered 2017-06-28: 75 mL via INTRAVENOUS

## 2017-07-12 ENCOUNTER — Ambulatory Visit: Payer: Medicare Other | Attending: Internal Medicine

## 2017-07-12 DIAGNOSIS — G4733 Obstructive sleep apnea (adult) (pediatric): Secondary | ICD-10-CM | POA: Insufficient documentation

## 2017-07-12 DIAGNOSIS — G4761 Periodic limb movement disorder: Secondary | ICD-10-CM | POA: Insufficient documentation

## 2017-07-12 DIAGNOSIS — R0683 Snoring: Secondary | ICD-10-CM | POA: Insufficient documentation

## 2017-07-16 DIAGNOSIS — G4733 Obstructive sleep apnea (adult) (pediatric): Secondary | ICD-10-CM | POA: Diagnosis not present

## 2017-07-18 ENCOUNTER — Ambulatory Visit (INDEPENDENT_AMBULATORY_CARE_PROVIDER_SITE_OTHER): Payer: Medicare Other | Admitting: Internal Medicine

## 2017-07-18 ENCOUNTER — Encounter: Payer: Self-pay | Admitting: Internal Medicine

## 2017-07-18 ENCOUNTER — Telehealth: Payer: Self-pay | Admitting: *Deleted

## 2017-07-18 VITALS — BP 130/84 | HR 74 | Ht 74.0 in | Wt 285.8 lb

## 2017-07-18 DIAGNOSIS — R0609 Other forms of dyspnea: Secondary | ICD-10-CM

## 2017-07-18 DIAGNOSIS — I82431 Acute embolism and thrombosis of right popliteal vein: Secondary | ICD-10-CM

## 2017-07-18 DIAGNOSIS — I10 Essential (primary) hypertension: Secondary | ICD-10-CM | POA: Diagnosis not present

## 2017-07-18 DIAGNOSIS — R0789 Other chest pain: Secondary | ICD-10-CM

## 2017-07-18 DIAGNOSIS — G4733 Obstructive sleep apnea (adult) (pediatric): Secondary | ICD-10-CM

## 2017-07-18 DIAGNOSIS — R0600 Dyspnea, unspecified: Secondary | ICD-10-CM

## 2017-07-18 NOTE — Telephone Encounter (Signed)
Pt aware of results of sleep study and that he needs a titration study. Orders placed. Nothing further needed.

## 2017-07-18 NOTE — Patient Instructions (Signed)
Medication Instructions:  Your physician recommends that you continue on your current medications as directed. Please refer to the Current Medication list given to you today.   Labwork: none  Testing/Procedures: Your physician has requested that you have an echocardiogram. Echocardiography is a painless test that uses sound waves to create images of your heart. It provides your doctor with information about the size and shape of your heart and how well your heart's chambers and valves are working. This procedure takes approximately one hour. There are no restrictions for this procedure. You may get an IV, if needed, to receive an ultrasound enhancing agent through to better visualize your heart.    Follow-Up: Your physician recommends that you schedule a follow-up appointment in: [place] APP.   Any Other Special Instructions Will Be Listed Below (If Applicable).  Your doctor recommends your wear compression stockings during the day. You can get these at a sock store or drug store.  If you need a refill on your cardiac medications before your next appointment, please call your pharmacy.    Echocardiogram An echocardiogram, or echocardiography, uses sound waves (ultrasound) to produce an image of your heart. The echocardiogram is simple, painless, obtained within a short period of time, and offers valuable information to your health care provider. The images from an echocardiogram can provide information such as:  Evidence of coronary artery disease (CAD).  Heart size.  Heart muscle function.  Heart valve function.  Aneurysm detection.  Evidence of a past heart attack.  Fluid buildup around the heart.  Heart muscle thickening.  Assess heart valve function.  Tell a health care provider about:  Any allergies you have.  All medicines you are taking, including vitamins, herbs, eye drops, creams, and over-the-counter medicines.  Any problems you or family  members have had with anesthetic medicines.  Any blood disorders you have.  Any surgeries you have had.  Any medical conditions you have.  Whether you are pregnant or may be pregnant. What happens before the procedure? No special preparation is needed. Eat and drink normally. What happens during the procedure?  In order to produce an image of your heart, gel will be applied to your chest and a wand-like tool (transducer) will be moved over your chest. The gel will help transmit the sound waves from the transducer. The sound waves will harmlessly bounce off your heart to allow the heart images to be captured in real-time motion. These images will then be recorded.  You may need an IV to receive a medicine that improves the quality of the pictures. What happens after the procedure? You may return to your normal schedule including diet, activities, and medicines, unless your health care provider tells you otherwise. This information is not intended to replace advice given to you by your health care provider. Make sure you discuss any questions you have with your health care provider. Document Released: 02/03/2000 Document Revised: 09/24/2015 Document Reviewed: 10/13/2012 Elsevier Interactive Patient Education  2017 [employer].

## 2017-07-18 NOTE — Progress Notes (Signed)
New Outpatient Visit Date: 07/18/2017  Referring Provider: Alwyn Pea, NP .  , [place] [id number]  Chief Complaint: Dyspnea on exertion  HPI:  Mr. Simien is a 72 y.o. male who is being seen today for the evaluation of dyspnea on exertion at the request of Alwyn Pea, NP. He has a history of hypertension, recently diagnosed right lower extremity DVT and GERD.  Mr. Hiller reports progressive dyspnea on exertion over the last 2 to 3 years which is now present with even mild activity such as walking to his front door.  He believes some cardiac testing was done 2 to 3 years ago (though he denies ever having a stress test, catheterization, or echocardiogram).  He believes the evaluation was normal.  However, because of progressive dyspnea, he was evaluated by pulmonology earlier this month.  He was found to have a right popliteal vein DVT without pulmonary embolism on CTA.  Mr. Rhett reports occasional chest pain that he describes as "gas.".  Is also noted a different type of chest pain on the lower portion of the left chest wall that typically happens at rest and improves with coughing.  He describes it as a stabbing pain with 5/10 in intensity and is only happened 3-4 times over the last 2 years.  The most recent episode occurred about a month ago.  Mr. Berent denies orthopnea and PND.  He has been having intermittent headaches for the last few weeks as well as significant tinnitus.  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  --------------------------------------------------------------------------------------------------  Cardiovascular History & Procedures: Cardiovascular Problems:  Dyspnea on exertion  Atypical chest pain  Right lower extremity DVT  Risk Factors:  Hypertension, male gender, obesity, and age greater than 4  Cath/PCI:  None  CV Surgery:  None  EP Procedures and Devices:  None  Non-Invasive Evaluation(s):  Bilateral lower  extremity venous duplex (06/25/2017): Partially occlusive DVT of the right popliteal vein.  Thrombus appears subacute.  Recent CV Pertinent Labs: Lab Results  Component Value Date   K 4.1 06/25/2017   BUN 14 06/25/2017   CREATININE 1.13 06/25/2017    --------------------------------------------------------------------------------------------------  Past Medical History:  Diagnosis Date  . Back injury   . Colon polyp   . DVT (deep venous thrombosis) (HCC)    Right popliteal vein  . Emphysema of lung ([place])   . Hypertension   . Torn ligament     Past Surgical History:  Procedure Laterality Date  . COLONOSCOPY  2008  . COLONOSCOPY WITH PROPOFOL N/A 02/27/2017   Procedure: COLONOSCOPY WITH PROPOFOL;  Surgeon: Robert Bellow, MD;  Location: [facility] ENDOSCOPY;  Service: Endoscopy;  Laterality: N/A;  . ESOPHAGOGASTRODUODENOSCOPY (EGD) WITH PROPOFOL N/A 02/27/2017   Procedure: ESOPHAGOGASTRODUODENOSCOPY (EGD) WITH PROPOFOL;  Surgeon: Robert Bellow, MD;  Location: [facility] ENDOSCOPY;  Service: Endoscopy;  Laterality: N/A;  . HERNIA REPAIR    . KNEE SURGERY Left   . NECK SURGERY    . ROTATOR CUFF REPAIR Right     Current Meds  Medication Sig  . apixaban (ELIQUIS) 5 MG TABS tablet Take 1 tablet (5 mg total) by mouth 2 (two) times daily.  Marland Kitchen diltiazem (CARDIZEM CD) 300 MG 24 hr capsule Take 300 mg by mouth daily.   Marland Kitchen omeprazole (PRILOSEC) 40 MG capsule Take 1 capsule (40 mg total) by mouth daily.    Allergies: Patient has no known allergies.  Social History   Tobacco Use  . Smoking status: Former Smoker  Packs/day: 0.25    Years: 30.00    Pack years: 7.50    Types: Cigarettes    Last attempt to quit: 02/19/1994    Years since quitting: 23.4  . Smokeless tobacco: Never Used  Substance Use Topics  . Alcohol use: No    Frequency: Never  . Drug use: No    Family History  Problem Relation Age of Onset  . Lung cancer Mother   . Heart disease Father   . Colon cancer Maternal  Uncle     Review of Systems: A 12-system review of systems was performed and was negative except as noted in the HPI.  --------------------------------------------------------------------------------------------------  Physical Exam: BP 130/84 (BP Location: Right Arm, Patient Position: Sitting, Cuff Size: Normal)   Pulse 74   Ht 6\' 2"  (1.88 m)   Wt 285 lb 12 oz (129.6 kg)   BMI 36.69 kg/m   General: Obese man, seated comfortably in the exam room. HEENT: No conjunctival pallor or scleral icterus. Moist mucous membranes. OP clear. Neck: Supple without lymphadenopathy, thyromegaly, JVD, or HJR. No carotid bruit. Lungs: Normal work of breathing. Clear to auscultation bilaterally without wheezes or crackles. Heart: Regular rate and rhythm without murmurs, rubs, or gallops.  Unable to assess PMI due to body habitus. Abd: Bowel sounds present. Soft, NT/ND Arida unable to assess HSM due to body habitus. Ext: Trace bilateral lower extremity edema, right greater than left. Radial, PT, and DP pulses are 2+ bilaterally Skin: Warm and dry without rash. Neuro: CNIII-XII intact. Strength and fine-touch sensation intact in upper and lower extremities bilaterally. Psych: Normal mood and affect.  EKG: Normal sinus rhythm without abnormalities.  Lab Results  Component Value Date   WBC 7.4 06/25/2017   HGB 17.4 06/25/2017   HCT 50.9 06/25/2017   MCV 89.3 06/25/2017   PLT 137 (L) 06/25/2017    Lab Results  Component Value Date   NA 136 06/25/2017   K 4.1 06/25/2017   CL 102 06/25/2017   CO2 27 06/25/2017   BUN 14 06/25/2017   CREATININE 1.13 06/25/2017   GLUCOSE 90 06/25/2017    No results found for: CHOL, HDL, LDLCALC, LDLDIRECT, TRIG, CHOLHDL   --------------------------------------------------------------------------------------------------  ASSESSMENT AND PLAN: Dyspnea on exertion and atypical chest pain Chronic and likely multifactorial.  Recent CT of the chest was notable for  emphysema, which may be the principal cause of his shortness of breath.  I did not appreciate any significant coronary artery calcification on personal review of the images.  Nonetheless, Mr. Escoto has several cardiac risk factors.  We have agreed to begin with a transthoracic echocardiogram.  If this demonstrates preserved LV function, we will follow with a pharmacologic myocardial perfusion stress test.  If significantly reduced LVEF or regional wall motion abnormality is identified, I would instead favor proceeding directly with catheterization.  Right lower extremity DVT Sonographic appearance was suggestive of a subacute clot.  Mild leg edema persists on exam today.  I have encouraged Mr. Cookson to wear compression stockings and to continue rivaroxaban.  Given that this was likely an unprovoked DVT, I would advocate for long-term anticoagulation.  Consultation with hematology may also be useful for further work-up and management of DVT.  Hypertension Blood pressure borderline elevated.  No medication changes at this time.  Follow-up: Return to clinic in 6 weeks.  Nelva Bush, MD 07/18/2017 12:11 PM

## 2017-07-19 ENCOUNTER — Encounter: Payer: Self-pay | Admitting: Internal Medicine

## 2017-07-19 DIAGNOSIS — R0600 Dyspnea, unspecified: Secondary | ICD-10-CM | POA: Insufficient documentation

## 2017-07-19 DIAGNOSIS — R0609 Other forms of dyspnea: Principal | ICD-10-CM

## 2017-07-19 DIAGNOSIS — R0789 Other chest pain: Secondary | ICD-10-CM | POA: Insufficient documentation

## 2017-07-19 DIAGNOSIS — I82431 Acute embolism and thrombosis of right popliteal vein: Secondary | ICD-10-CM | POA: Insufficient documentation

## 2017-07-24 ENCOUNTER — Ambulatory Visit: Payer: Medicare Other | Attending: Internal Medicine

## 2017-07-24 DIAGNOSIS — G4761 Periodic limb movement disorder: Secondary | ICD-10-CM | POA: Diagnosis not present

## 2017-07-24 DIAGNOSIS — G4733 Obstructive sleep apnea (adult) (pediatric): Secondary | ICD-10-CM | POA: Diagnosis not present

## 2017-07-25 ENCOUNTER — Ambulatory Visit (INDEPENDENT_AMBULATORY_CARE_PROVIDER_SITE_OTHER): Payer: Medicare Other

## 2017-07-25 ENCOUNTER — Other Ambulatory Visit: Payer: Self-pay

## 2017-07-25 DIAGNOSIS — R0789 Other chest pain: Secondary | ICD-10-CM | POA: Diagnosis not present

## 2017-07-25 DIAGNOSIS — R0609 Other forms of dyspnea: Secondary | ICD-10-CM

## 2017-07-25 DIAGNOSIS — R0600 Dyspnea, unspecified: Secondary | ICD-10-CM

## 2017-07-25 MED ORDER — PERFLUTREN LIPID MICROSPHERE
1.0000 mL | INTRAVENOUS | Status: AC | PRN
  Administered 2017-07-25: 2 mL via INTRAVENOUS

## 2017-07-26 DIAGNOSIS — G4733 Obstructive sleep apnea (adult) (pediatric): Secondary | ICD-10-CM | POA: Diagnosis not present

## 2017-07-30 ENCOUNTER — Telehealth: Payer: Self-pay | Admitting: *Deleted

## 2017-07-30 DIAGNOSIS — R0602 Shortness of breath: Secondary | ICD-10-CM

## 2017-07-30 DIAGNOSIS — G4733 Obstructive sleep apnea (adult) (pediatric): Secondary | ICD-10-CM

## 2017-07-30 NOTE — Telephone Encounter (Signed)
Patient verbalized understanding of results and plan of care. He verbalized understanding of the following instructions and appointment for the [facility] below:   [place] caregiver has ordered a Stress Test with nuclear imaging. The purpose of this test is to evaluate the blood supply to your heart muscle. This procedure is referred to as a "Non-Invasive Stress Test." This is because other than having an IV started in your vein, nothing is inserted or "invades" your body. Cardiac stress tests are done to find areas of poor blood flow to the heart by determining the extent of coronary artery disease (CAD). Some patients exercise on a treadmill, which naturally increases the blood flow to your heart, while others who are  unable to walk on a treadmill due to physical limitations have a pharmacologic/chemical stress agent called Lexiscan . This medicine will mimic walking on a treadmill by temporarily increasing your coronary blood flow.   Please note: these test may take anywhere between 2-4 hours to complete  PLEASE REPORT TO [facility] AT THE FIRST DESK WILL DIRECT YOU WHERE TO GO  Date of Procedure:___06/18/19__________  Arrival Time for Procedure:________07:45am____________  Instructions regarding medication:   Take morning medications as usual with a small sip of water.   PLEASE NOTIFY THE OFFICE AT LEAST 75 HOURS IN ADVANCE IF YOU ARE UNABLE TO KEEP YOUR APPOINTMENT.  [phone] AND  PLEASE NOTIFY NUCLEAR MEDICINE AT [facility] AT LEAST 24 HOURS IN ADVANCE IF YOU ARE UNABLE TO KEEP YOUR APPOINTMENT. [phone]  How to prepare for your Myoview test:  1. Do not eat or drink after midnight 2. No caffeine for 24 hours prior to test 3. No smoking 24 hours prior to test. 4. Your medication may be taken with water.  If your doctor stopped a medication because of this test, do not take that medication. 5. Ladies, please do not wear dresses.  Skirts or  pants are appropriate. Please wear a short sleeve shirt. 6. No perfume, cologne or lotion. 7.  Wear comfortable walking shoes.

## 2017-07-30 NOTE — Telephone Encounter (Signed)
-----   Message from Nelva Bush, MD sent at 07/29/2017  1:53 PM EDT ----- Please let Mr. Zachary Sweeney know that his echo shows that his heart is contracting well.  There is mild stiffening of the left ventricle, which is common and could be related to his history of hypertension.  I recommend that we proceed with a pharmacologic myocardial perfusion stress test at his convenience for evaluation of his dyspnea on exertion.

## 2017-07-30 NOTE — Telephone Encounter (Signed)
LMTCB x1.ss 

## 2017-07-31 ENCOUNTER — Other Ambulatory Visit: Payer: Self-pay | Admitting: *Deleted

## 2017-07-31 DIAGNOSIS — G4733 Obstructive sleep apnea (adult) (pediatric): Secondary | ICD-10-CM

## 2017-07-31 NOTE — Telephone Encounter (Signed)
Pt aware Order placed Nothing further needed.

## 2017-08-06 ENCOUNTER — Encounter
Admission: RE | Admit: 2017-08-06 | Discharge: 2017-08-06 | Disposition: A | Discharge status: Home / Self Care | Payer: Medicare Other | Source: Ambulatory Visit | Attending: Internal Medicine | Admitting: Internal Medicine

## 2017-08-06 DIAGNOSIS — R0602 Shortness of breath: Secondary | ICD-10-CM | POA: Insufficient documentation

## 2017-08-06 LAB — NM MYOCAR MULTI W/SPECT W/WALL MOTION / EF
CHL CUP MPHR: 150 {beats}/min
CHL CUP RESTING HR STRESS: 62 {beats}/min
CSEPEDS: 0 s
CSEPPHR: 90 {beats}/min
Estimated workload: 1 METS
Exercise duration (min): 0 min
LV dias vol: 125 mL (ref 62–150)
LVSYSVOL: 37 mL
NUC STRESS TID: 0.95
Percent HR: 60 %

## 2017-08-06 MED ORDER — TECHNETIUM TC 99M TETROFOSMIN IV KIT
10.0000 | PACK | Freq: Once | INTRAVENOUS | Status: AC | PRN
  Administered 2017-08-06: 13.7 via INTRAVENOUS

## 2017-08-06 MED ORDER — REGADENOSON 0.4 MG/5ML IV SOLN
0.4000 mg | Freq: Once | INTRAVENOUS | Status: AC
  Administered 2017-08-06: 0.4 mg via INTRAVENOUS

## 2017-08-06 MED ORDER — TECHNETIUM TC 99M TETROFOSMIN IV KIT
33.8440 | PACK | Freq: Once | INTRAVENOUS | Status: AC | PRN
  Administered 2017-08-06: 33.844 via INTRAVENOUS

## 2017-08-20 ENCOUNTER — Ambulatory Visit: Payer: Medicare Other | Attending: Internal Medicine

## 2017-08-21 ENCOUNTER — Ambulatory Visit: Payer: Medicare Other | Admitting: Cardiovascular Disease

## 2017-08-26 NOTE — Progress Notes (Deleted)
[facility] Consultation      Assessment and Plan:  Dyspnea on exertion. - With excess mucus production, and history of smoking may be indicative of COPD. - We will start empirically on Spiriva inhaler, sent patient for chest x-ray and pulmonary function testing.  Chest pain. - Occasional chest pain, though atypical and can sometimes occur at rest as well as with activity. - We will refer to cardiology as soon as possible.  GERD. - Continue omeprazole.  Right lower extremity edema. - We will obtain lower extremity ultrasound to rule out DVT.  Excessive daytime sleepiness. -Poor sleep hygiene, with symptoms and signs of obstructive sleep apnea, will send for sleep study.  No orders of the defined types were placed in this encounter.  No orders of the defined types were placed in this encounter.  No follow-ups on file.    Date: 08/26/2017  MRN# [id number] PACER DORN 04-23-1945   Zachary Sweeney is a 72 y.o. old male seen in consultation for chief complaint of:    No chief complaint on file.   HPI:  The patient is a 72 year old male last seen here in the office on 06/25/2017.  At that time he noted progressive dyspnea on exertion with lower extremity edema.  He was sent for a ultrasound of the lower extremity which showed right lower extremity subacute DVT in the popliteal vein.  Patient was started on Eliquis, CT chest on 06/28/2017 showed no evidence of PE, but was consistent with emphysematous changes.  He notes that he has trouble breathing on exertion, occasional chest pain, and pain in his legs. He has a yard  About flat 200 square feet that he mows and this year he can mow only half. He runs out of breath when he does things.  He has never been diagnosed with copd or asthma. He is on no inhalers currently. He is not smoking, he has minimal to no cough, and he denies excess mucus production.  He has reflux which is controlled with prilosec.  He  has irregular sleep habits, goes to bed at 1am, but only falls asleep at 5, wakes at 9am. Then is up and takes a 2 hour nap in the afternoon.   **Sleep study CPAP titration 07/24/2017>> recommend an auto CPAP with pressure range 6-12. **Sleep study split-night 07/12/2017>> severe obstructive sleep apnea with AHI 35.7. **Myocardial perfusion study 08/06/2017>> normal ejection fraction, normal pharmacologic myocardial perfusion stress test. **CT chest 06/28/2017>> images personally reviewed, bilateral emphysema, worse in the apices.  Large pulmonary arteries, suggestive of pulmonary hypertension. **Lower extremity Doppler 06/25/2017>>The study is positive for partially occlusive DVT in the right popliteal vein. It has a subacute appearance.    PMHX:   Past Medical History:  Diagnosis Date  . Back injury   . Colon polyp   . DVT (deep venous thrombosis) (HCC)    Right popliteal vein  . Emphysema of lung ([place])   . Hypertension   . Torn ligament    Surgical Hx:  Past Surgical History:  Procedure Laterality Date  . COLONOSCOPY  2008  . COLONOSCOPY WITH PROPOFOL N/A 02/27/2017   Procedure: COLONOSCOPY WITH PROPOFOL;  Surgeon: Robert Bellow, MD;  Location: [facility] ENDOSCOPY;  Service: Endoscopy;  Laterality: N/A;  . ESOPHAGOGASTRODUODENOSCOPY (EGD) WITH PROPOFOL N/A 02/27/2017   Procedure: ESOPHAGOGASTRODUODENOSCOPY (EGD) WITH PROPOFOL;  Surgeon: Robert Bellow, MD;  Location: [facility] ENDOSCOPY;  Service: Endoscopy;  Laterality: N/A;  . HERNIA REPAIR    . KNEE  SURGERY Left   . NECK SURGERY    . ROTATOR CUFF REPAIR Right    Family Hx:  Family History  Problem Relation Age of Onset  . Lung cancer Mother   . Heart disease Father   . Colon cancer Maternal Uncle    Social Hx:   Social History   Tobacco Use  . Smoking status: Former Smoker    Packs/day: 0.25    Years: 30.00    Pack years: 7.50    Types: Cigarettes    Last attempt to quit: 02/19/1994    Years since quitting: 23.5  .  Smokeless tobacco: Never Used  Substance Use Topics  . Alcohol use: No    Frequency: Never  . Drug use: No   Medication:    Current Outpatient Medications:  .  apixaban (ELIQUIS) 5 MG TABS tablet, Take 1 tablet (5 mg total) by mouth 2 (two) times daily., Disp: 60 tablet, Rfl: 3 .  diltiazem (CARDIZEM CD) 300 MG 24 hr capsule, Take 300 mg by mouth daily. , Disp: , Rfl:  .  omeprazole (PRILOSEC) 40 MG capsule, Take 1 capsule (40 mg total) by mouth daily., Disp: 30 capsule, Rfl: 5   Allergies:  Patient has no known allergies.  Review of Systems: Gen:  Denies  fever, sweats, chills HEENT: Denies blurred vision, double vision. bleeds, sore throat Cvc:  No dizziness, chest pain. Resp:   Denies cough or sputum production, shortness of breath Gi: Denies swallowing difficulty, stomach pain. Gu:  Denies bladder incontinence, burning urine Ext:   No Joint pain, stiffness. Skin: No skin rash,  hives  Endoc:  No polyuria, polydipsia. Psych: No depression, insomnia. Other:  All other systems were reviewed with the patient and were negative other that what is mentioned in the HPI.   Physical Examination:   VS: There were no vitals taken for this visit.  General Appearance: No distress  Neuro:without focal findings,  speech normal,  HEENT: PERRLA, EOM intact.   Pulmonary: normal breath sounds, No wheezing.  CardiovascularNormal S1,S2.  No m/r/g.   Abdomen: Benign, Soft, non-tender. Renal:  No costovertebral tenderness  GU:  No performed at this time. Endoc: No evident thyromegaly, no signs of acromegaly. Skin:   warm, no rashes, no ecchymosis  Extremities: normal, no cyanosis, clubbing.  1-2+ right lower extremity edema.  No erythema or pain.  Other findings:    LABORATORY PANEL:   CBC No results for input(s): WBC, HGB, HCT, PLT in the last 168 hours. ------------------------------------------------------------------------------------------------------------------  Chemistries    No results for input(s): NA, K, CL, CO2, GLUCOSE, BUN, CREATININE, CALCIUM, MG, AST, ALT, ALKPHOS, BILITOT in the last 168 hours.  Invalid input(s): GFRCGP ------------------------------------------------------------------------------------------------------------------  Cardiac Enzymes No results for input(s): TROPONINI in the last 168 hours. ------------------------------------------------------------  RADIOLOGY:  No results found.     Thank  you for the consultation and for allowing [facility] to assist in the care of your patient. Our recommendations are noted above.  Please contact us if we can be of further service.   Marda Stalker, MD.  Board Certified in Internal Medicine, Pulmonary Medicine, [place], and Sleep Medicine.  [facility] Office Number: [phone]  Patricia Pesa, M.D.  Merton Border, M.D  08/26/2017

## 2017-08-27 ENCOUNTER — Ambulatory Visit: Payer: Medicare Other | Admitting: Internal Medicine

## 2017-08-28 ENCOUNTER — Encounter: Payer: Self-pay | Admitting: Internal Medicine

## 2017-09-04 ENCOUNTER — Encounter: Payer: Self-pay | Admitting: Nurse Practitioner

## 2017-09-04 ENCOUNTER — Ambulatory Visit (INDEPENDENT_AMBULATORY_CARE_PROVIDER_SITE_OTHER): Payer: Medicare Other | Admitting: Nurse Practitioner

## 2017-09-04 VITALS — BP 134/70 | HR 79 | Ht 74.0 in | Wt 283.8 lb

## 2017-09-04 DIAGNOSIS — I82431 Acute embolism and thrombosis of right popliteal vein: Secondary | ICD-10-CM

## 2017-09-04 DIAGNOSIS — I10 Essential (primary) hypertension: Secondary | ICD-10-CM | POA: Diagnosis not present

## 2017-09-04 DIAGNOSIS — R0602 Shortness of breath: Secondary | ICD-10-CM

## 2017-09-04 DIAGNOSIS — R61 Generalized hyperhidrosis: Secondary | ICD-10-CM

## 2017-09-04 DIAGNOSIS — R0600 Dyspnea, unspecified: Secondary | ICD-10-CM

## 2017-09-04 DIAGNOSIS — R0609 Other forms of dyspnea: Secondary | ICD-10-CM

## 2017-09-04 MED ORDER — DILTIAZEM HCL ER COATED BEADS 300 MG PO CP24: 300.0000 mg | ORAL_CAPSULE | Freq: Every day | ORAL | 3 refills | Status: AC

## 2017-09-04 NOTE — Progress Notes (Deleted)
[facility] Consultation      Assessment and Plan:  Dyspnea on exertion. - With excess mucus production, and history of smoking may be indicative of COPD. - We will start empirically on Spiriva inhaler, sent patient for chest x-ray and pulmonary function testing.  Chest pain. - Occasional chest pain, though atypical and can sometimes occur at rest as well as with activity. - We will refer to cardiology as soon as possible.  GERD. - Continue omeprazole.  Right lower extremity edema. - We will obtain lower extremity ultrasound to rule out DVT.  Excessive daytime sleepiness. -Poor sleep hygiene, with symptoms and signs of obstructive sleep apnea, will send for sleep study.  No orders of the defined types were placed in this encounter.  No orders of the defined types were placed in this encounter.  No follow-ups on file.    Date: 09/04/2017  MRN# [id number] KAMREN HEINTZELMAN 10-18-1945   RASHID WHITENIGHT is a 72 y.o. old male seen in consultation for chief complaint of:    No chief complaint on file.   HPI:  The patient is a 72 year old male last seen here in the office on 06/25/2017.  At that time he noted progressive dyspnea on exertion with lower extremity edema.  He was sent for a ultrasound of the lower extremity which showed right lower extremity subacute DVT in the popliteal vein.  Patient was started on Eliquis, CT chest on 06/28/2017 showed no evidence of PE, but was consistent with emphysematous changes. He has had a sleep study which showed severe OSA and he was started on CPAP with pressure of 6-12.   He notes that he has trouble breathing on exertion, occasional chest pain, and pain in his legs. He has a yard  About flat 200 square feet that he mows and this year he can mow only half. He runs out of breath when he does things.  He has never been diagnosed with copd or asthma. He is on no inhalers currently. He is not smoking, he has minimal to no  cough, and he denies excess mucus production.  He has reflux which is controlled with prilosec.  He has irregular sleep habits, goes to bed at 1am, but only falls asleep at 5, wakes at 9am. Then is up and takes a 2 hour nap in the afternoon.   **Sleep study CPAP titration 07/24/2017>> recommend an auto CPAP with pressure range 6-12. **Sleep study split-night 07/12/2017>> severe obstructive sleep apnea with AHI 35.7. **Myocardial perfusion study 08/06/2017>> normal ejection fraction, normal pharmacologic myocardial perfusion stress test. **CT chest 06/28/2017>> images personally reviewed, bilateral emphysema, worse in the apices.  Large pulmonary arteries, suggestive of pulmonary hypertension. **Lower extremity Doppler 06/25/2017>>The study is positive for partially occlusive DVT in the right popliteal vein. It has a subacute appearance.  Medication:    Current Outpatient Medications:  .  apixaban (ELIQUIS) 5 MG TABS tablet, Take 1 tablet (5 mg total) by mouth 2 (two) times daily., Disp: 60 tablet, Rfl: 3 .  diltiazem (CARDIZEM CD) 300 MG 24 hr capsule, Take 300 mg by mouth daily. , Disp: , Rfl:  .  omeprazole (PRILOSEC) 40 MG capsule, Take 1 capsule (40 mg total) by mouth daily., Disp: 30 capsule, Rfl: 5   Allergies:  Patient has no known allergies.      LABORATORY PANEL:   CBC No results for input(s): WBC, HGB, HCT, PLT in the last 168 hours. ------------------------------------------------------------------------------------------------------------------  Chemistries  No results for input(s):  NA, K, CL, CO2, GLUCOSE, BUN, CREATININE, CALCIUM, MG, AST, ALT, ALKPHOS, BILITOT in the last 168 hours.  Invalid input(s): GFRCGP ------------------------------------------------------------------------------------------------------------------  Cardiac Enzymes No results for input(s): TROPONINI in the last 168 hours. ------------------------------------------------------------  RADIOLOGY:    No results found.     Thank  you for the consultation and for allowing [facility] to assist in the care of your patient. Our recommendations are noted above.  Please contact us if we can be of further service.   Marda Stalker, MD.  Board Certified in Internal Medicine, Pulmonary Medicine, [place], and Sleep Medicine.  [facility] Office Number: [phone]  Patricia Pesa, M.D.  Merton Border, M.D  09/04/2017

## 2017-09-04 NOTE — Progress Notes (Signed)
Office Visit    Patient Name: Zachary Sweeney Date of Encounter: 09/04/2017  Primary Care Provider:  [facility] Primary Cardiologist:  Nelva Bush, MD  Chief Complaint    72 y/o ? with a history of dyspnea on exertion, hypertension, GERD, and recently diagnosed right lower extremity DVT, who presents for follow-up related to dyspnea and cardiovascular testing.  Past Medical History    Past Medical History:  Diagnosis Date  . Back injury   . Colon polyp   . DVT (deep venous thrombosis) ([place])    a. 06/2017 LE U/S: Right popliteal vein DVT.  Marland Kitchen Dyspnea on exertion    a. 06/2017 CTA Chest: No PE. No significant coronary Ca2+, centrilobular and paraseptal emphysema;  b.  07/2017 Echo: EF 60-65%, no rwma, Gr1 DD, mildly dil LA. Nl RV fxn; c. 07/2017 MV: Small, severe defect involving apical inf and apical segments - only on rest images consistent w/ artifact. No ischemia or scar.  . Emphysema of lung ([place])    a. 06/2017 CTA Chest: Centrilobular and paraseptal emphysema, with mild geographic ground-glass opacity likely representing small airway dzs.  Marland Kitchen Hypertension   . Torn ligament    Past Surgical History:  Procedure Laterality Date  . COLONOSCOPY  2008  . COLONOSCOPY WITH PROPOFOL N/A 02/27/2017   Procedure: COLONOSCOPY WITH PROPOFOL;  Surgeon: Robert Bellow, MD;  Location: [facility] ENDOSCOPY;  Service: Endoscopy;  Laterality: N/A;  . ESOPHAGOGASTRODUODENOSCOPY (EGD) WITH PROPOFOL N/A 02/27/2017   Procedure: ESOPHAGOGASTRODUODENOSCOPY (EGD) WITH PROPOFOL;  Surgeon: Robert Bellow, MD;  Location: [facility] ENDOSCOPY;  Service: Endoscopy;  Laterality: N/A;  . HERNIA REPAIR    . KNEE SURGERY Left   . NECK SURGERY    . ROTATOR CUFF REPAIR Right     Allergies  No Known Allergies  History of Present Illness    72 year old male with the above past medical history including hypertension, GERD, and recently diagnosed right lower extremity DVT.  He has a 2 to 3-year  history of dyspnea on exertion and recently underwent CT angiography of the chest which did not show any pulmonary embolism or significant coronary calcium.  Emphysema was noted (he did smoke 1/4 ppd x 30 yrs, quitting in 1996).  Due to dyspnea on exertion and occasional gas-like chest discomfort, he was evaluated by Dr. Saunders Revel on May 30, and underwent echocardiogram which showed normal LV function and grade 1 diastolic dysfunction.  No significant valvular abnormalities were noted.  This was followed by stress testing, which showed no evidence of ischemia or scar.  Since his last visit, his dyspnea on exertion has been stable.  He says he is very sedentary and sits most of the day.  He has not had any chest pain.  He briefly stopped his Eliquis, after completing the 10 mg twice daily 1 week course.  He resumed it a few days later but has only been taking it once a day.  We discussed the importance of taking it twice daily at length today.  He says today that over the past 4+ years, he has had episodic diaphoretic spells where his head breaks out in a significant sweat and he feels warm.  He is not sure if this is ever been worked up before.  He says he has mentioned it to his primary care provider in the past though at that physician has since retired.  This can occur suddenly and typically at rest and can last up to an hour  or more.  There are no associated symptoms when this occurs.  He denies any palpitations, PND, orthopnea, dizziness, syncope, or early satiety.  Home Medications    Prior to Admission medications   Medication Sig Start Date End Date Taking? Authorizing Provider  apixaban (ELIQUIS) 5 MG TABS tablet Take 1 tablet (5 mg total) by mouth 2 (two) times daily. 06/26/17   Laverle Hobby, MD  diltiazem (CARDIZEM CD) 300 MG 24 hr capsule Take 300 mg by mouth daily.  01/16/17   [provider]  omeprazole (PRILOSEC) 40 MG capsule Take 1 capsule (40 mg total) by mouth daily. 02/27/17  02/27/18  Robert Bellow, MD    Review of Systems    He continues to have right calf swelling.  He has chronic dyspnea on exertion which is unchanged.  He has episodic diaphoretic spells as described above.  He has been having trouble tolerating his CPAP, saying that it dries out his mouth and sinuses.  These have been present for several years.  He denies chest pain, palpitations, PND, orthopnea, dizziness, syncope, or early satiety.  All other systems reviewed and are otherwise negative except as noted above.  Physical Exam    VS:  BP 134/70 (BP Location: Left Arm, Patient Position: Sitting, Cuff Size: Large)   Pulse 79   Ht 6\' 2"  (1.88 m)   Wt 283 lb 12 oz (128.7 kg)   BMI 36.43 kg/m  , BMI Body mass index is 36.43 kg/m. GEN: obese, in no acute distress.  HEENT: normal.  Neck: Supple, no JVD, carotid bruits, or masses. Cardiac: RRR, no murmurs, rubs, or gallops. No clubbing, cyanosis, edema. R calf is larger than the left, no pitting edema. Radials/DP/PT 2+ and equal bilaterally.  Respiratory:  Respirations regular and unlabored, clear to auscultation bilaterally. GI: Soft, nontender, nondistended, BS + x 4. MS: no deformity or atrophy. Skin: warm and dry, no rash. Neuro:  Strength and sensation are intact. Psych: Normal affect.  Accessory Clinical Findings    Recent echocardiogram and stress testing reviewed with patient and information placed in past medical history above.  Patient refused ECG today.  Assessment & Plan    1.  Dyspnea on exertion: Patient with chronic, multifactorial dyspnea on exertion recently evaluated by Dr. Saunders Revel in late May.  Echocardiogram showed normal LV function with grade 1 diastolic dysfunction while nuclear stress testing showed no evidence of ischemia or infarct.  Previous CTA of the chest did show emphysema.  No significant coronary calcium.  He admits to a sedentary lifestyle and feels that he is out of shape.  I did encourage him to begin  engaging in a regular exercise regimen with a goal of increased fitness and weight loss.  No further cardiac evaluation at this time.  2.  Right lower extremity DVT: This was diagnosed in the emergency department in May.  Patient says at some point he stopped his Eliquis because he was not sure what he was supposed to be taking and then started back at once a day.  We discussed the importance of taking Eliquis 5 mg twice daily and also discussed that he will likely be on this long-term in the setting of unprovoked DVT.  I recommended he follow-up with primary care and did offer to refer to hematology.  He wished to follow-up with primary care prior to any referral.  3.  Essential hypertension: Blood pressure mildly elevated at 134/70.  He has been out of diltiazem for 3 days and  we are refilling today.  4.  Diaphoresis: Patient reports a several year history of intermittent diaphoretic spells that typically come on suddenly at rest associated with a sense of feeling warm, and resolving spontaneously.  He has mentioned this to his primary care provider in the past but is not sure if he is ever had any work-up.  I will check a TSH.  Recommend he follow-up with primary care.  5.  Obstructive sleep apnea: He has been having trouble tolerating CPAP.  I recommend he follow-up with pulmonology.  He may do better with nasal pillows if appropriate.  6.  Disposition: TSH today.  Follow-up in clinic in 4 to 6 months.  Murray Hodgkins, NP 09/04/2017, 1:07 PM

## 2017-09-04 NOTE — Patient Instructions (Addendum)
Medication Instructions:  Your physician has recommended you continue your medication:  1. TAKE Eliquis TWICE A DAY   Labwork: TSH done today and we will call you with those results.    Follow-Up: Your physician wants you to follow-up in: Dr. Saunders Revel in 6 months. You will receive a reminder letter in the mail two months in advance. If you don't receive a letter, please call our office to schedule the follow-up appointment.  It was a pleasure seeing you today here in the office. Please do not hesitate to give [place] a call back if you have any further questions. [place], BSN

## 2017-09-05 LAB — TSH: TSH: 2.17 u[IU]/mL (ref 0.450–4.500)

## 2017-09-06 ENCOUNTER — Encounter: Payer: Self-pay | Admitting: Internal Medicine

## 2017-09-06 ENCOUNTER — Ambulatory Visit: Payer: Medicare Other | Admitting: Internal Medicine

## 2017-09-06 ENCOUNTER — Ambulatory Visit (INDEPENDENT_AMBULATORY_CARE_PROVIDER_SITE_OTHER): Payer: Medicare Other | Admitting: Internal Medicine

## 2017-09-06 VITALS — BP 130/78 | HR 71 | Resp 16 | Ht 74.0 in | Wt 281.0 lb

## 2017-09-06 DIAGNOSIS — K2100 Gastro-esophageal reflux disease with esophagitis, without bleeding: Secondary | ICD-10-CM

## 2017-09-06 DIAGNOSIS — G4733 Obstructive sleep apnea (adult) (pediatric): Secondary | ICD-10-CM

## 2017-09-06 DIAGNOSIS — J302 Other seasonal allergic rhinitis: Secondary | ICD-10-CM

## 2017-09-06 DIAGNOSIS — J410 Simple chronic bronchitis: Secondary | ICD-10-CM

## 2017-09-06 MED ORDER — CETIRIZINE HCL 10 MG PO TABS: 10.0000 mg | ORAL_TABLET | Freq: Every day | ORAL | 6 refills | Status: DC

## 2017-09-06 MED ORDER — FLUTICASONE-SALMETEROL 115-21 MCG/ACT IN AERO: 2.0000 | INHALATION_SPRAY | Freq: Two times a day (BID) | RESPIRATORY_TRACT | 12 refills | Status: DC

## 2017-09-06 MED ORDER — OMEPRAZOLE 20 MG PO CPDR: 20.0000 mg | DELAYED_RELEASE_CAPSULE | Freq: Every day | ORAL | 6 refills | Status: DC

## 2017-09-06 NOTE — Progress Notes (Signed)
[facility] Consultation   Addendum 06/26/2017 Noted to have a positive DVT, appeared subacute.  Was sent to the ED and started on Eliquis.  Given his dyspnea on exertion, will send for CT chest to rule out pulmonary embolism.  CT  Chest 06/28/17 No acute PE noted  Date: 09/06/2017  MRN# [id number] Zachary Sweeney 20-Mar-1945   Zachary Sweeney is a 72 y.o. old male seen in consultation for chief complaint of:    Chief Complaint  Patient presents with  . Sleep Apnea    pt has turned in cpap device.  . Shortness of Breath    with exertion    HPI:  +chronic productive cough +SOB +OSA on AUTO-CPAP patient hated the machine says its too much pressure and he mailed it to company Has fatigue  No apparent distress  No signs of infection No signs of CHF   PMHX:   Past Medical History:  Diagnosis Date  . Back injury   . Colon polyp   . DVT (deep venous thrombosis) ([place])    a. 06/2017 LE U/S: Right popliteal vein DVT.  Marland Kitchen Dyspnea on exertion    a. 06/2017 CTA Chest: No PE. No significant coronary Ca2+, centrilobular and paraseptal emphysema;  b.  07/2017 Echo: EF 60-65%, no rwma, Gr1 DD, mildly dil LA. Nl RV fxn; c. 07/2017 MV: Small, severe defect involving apical inf and apical segments - only on rest images consistent w/ artifact. No ischemia or scar.  . Emphysema of lung ([place])    a. 06/2017 CTA Chest: Centrilobular and paraseptal emphysema, with mild geographic ground-glass opacity likely representing small airway dzs.  Marland Kitchen Hypertension   . Torn ligament    Social Hx:   Social History   Tobacco Use  . Smoking status: Former Smoker    Packs/day: 0.25    Years: 30.00    Pack years: 7.50    Types: Cigarettes    Last attempt to quit: 02/19/1994    Years since quitting: 23.5  . Smokeless tobacco: Never Used  Substance Use Topics  . Alcohol use: No    Frequency: Never  . Drug use: No   Medication:    Current Outpatient Medications:  .  apixaban (ELIQUIS)  5 MG TABS tablet, Take 1 tablet (5 mg total) by mouth 2 (two) times daily. (Patient taking differently: Take 5 mg by mouth daily. ), Disp: 60 tablet, Rfl: 3 .  diltiazem (CARDIZEM CD) 300 MG 24 hr capsule, Take 1 capsule (300 mg total) by mouth daily., Disp: 90 capsule, Rfl: 3   Allergies:  Patient has no known allergies.  Review of Systems: Gen:  Denies  fever, sweats, chills HEENT: Denies blurred vision, double vision. bleeds, sore throat Cvc:  No dizziness, chest pain. Resp:  + sputum production, +shortness of breath Gi: Denies swallowing difficulty, stomach pain. Gu:  Denies bladder incontinence, burning urine Ext:   No Joint pain, stiffness. Skin: No skin rash,  hives  Endoc:  No polyuria, polydipsia. Psych: No depression, insomnia. Other:  All other systems were reviewed with the patient and were negative other that what is mentioned in the HPI.   Physical Examination:   VS: BP 130/78 (BP Location: Left Arm, Cuff Size: Large)   Pulse 71   Resp 16   Ht 6\' 2"  (1.88 m)   Wt 281 lb (127.5 kg)   SpO2 96%   BMI 36.08 kg/m   General Appearance: No distress  Neuro:without focal findings,  speech  normal,  HEENT: PERRLA, EOM intact.   Pulmonary: normal breath sounds, No wheezing.  CardiovascularNormal S1,S2.  No m/r/g.   Abdomen: Benign, Soft, non-tender. Renal:  No costovertebral tenderness  GU:  No performed at this time. Endoc: No evident thyromegaly, no signs of acromegaly. Skin:   warm, no rashes, no ecchymosis  Extremities: normal, no cyanosis, clubbing.  1-2+ right lower extremity edema.  No erythema or pain.     Assessment and Plan: 72 yo obese AAM with evidence of Chronic Bronchitis with OSA associated with morbid obesity and deconditioned state   Dyspnea on exertion-multifactorial - With excess mucus production, and history of smoking may be indicative of COPD. -start ADVAIR THERAPY  GERD - Continue omeprazole.  +DVT  -continue elliqiuos   Excessive  daytime sleepiness. +OSA Will need to adjust pressure 5-15 cm h20  Allerrgic Rhinitis Start Zyrtec  Patient  satisfied with Plan of action and management. All questions answered  Corrin Parker, M.D.  [facility]  Medical Director [place] Director [facility]

## 2017-09-06 NOTE — Patient Instructions (Addendum)
START ADVAIR START ZYRTEC  CONTINUE PRILOSEC  CHANGE PRESSURE TO AUTOCPAP 5-10 CM H20

## 2017-09-17 ENCOUNTER — Telehealth: Payer: Self-pay | Admitting: Nurse Practitioner

## 2017-09-17 NOTE — Telephone Encounter (Signed)
Refill Request.  

## 2017-09-17 NOTE — Telephone Encounter (Signed)
°*  STAT* If patient is at the pharmacy, call can be transferred to refill team.   1. Which medications need to be refilled? (please list name of each medication and dose if known) Eliquis   2. Which pharmacy/location (including street and city if local pharmacy) is medication to be sent to?[facility] [address]   3. Do they need a 30 day or 90 day supply? 90 day

## 2017-09-17 NOTE — Telephone Encounter (Signed)
Please review for refill, Thanks !  

## 2017-09-18 MED ORDER — APIXABAN 5 MG PO TABS: 5.0000 mg | ORAL_TABLET | Freq: Two times a day (BID) | ORAL | 1 refills | Status: DC

## 2017-09-18 NOTE — Telephone Encounter (Signed)
Wt wt 127.5 kg, age 72, serum creatinine 1.13, CrCl 109.7.  Refill for Eliquis 5 mg BID, #180 w/ 1 refill sent to [facility] [address].

## 2017-10-17 ENCOUNTER — Telehealth: Payer: Self-pay | Admitting: Nurse Practitioner

## 2017-10-17 NOTE — Telephone Encounter (Signed)
Spoke with Zachary Sweeney at [facility] and reviewed diagnosis code was listed as R06.09 for that test. She states that it was updated and will send back to processing. No other needs at this time.

## 2017-10-17 NOTE — Telephone Encounter (Signed)
Reference # [id number]. Please call regarding invalid DX  For DOS: 09/05/2017

## 2017-10-30 DIAGNOSIS — G4733 Obstructive sleep apnea (adult) (pediatric): Secondary | ICD-10-CM | POA: Diagnosis present

## 2017-11-20 LAB — HM HIV SCREENING LAB: HM HIV Screening: NEGATIVE

## 2018-02-15 ENCOUNTER — Other Ambulatory Visit: Payer: Self-pay | Admitting: Internal Medicine

## 2018-02-17 NOTE — Telephone Encounter (Signed)
Refill Request.  

## 2018-03-17 ENCOUNTER — Encounter: Payer: Self-pay | Admitting: *Deleted

## 2018-03-20 ENCOUNTER — Encounter: Payer: Self-pay | Admitting: Gastroenterology

## 2018-03-30 ENCOUNTER — Other Ambulatory Visit: Payer: Self-pay

## 2018-03-30 ENCOUNTER — Emergency Department: Payer: Medicare Other

## 2018-03-30 ENCOUNTER — Emergency Department
Admission: EM | Admit: 2018-03-30 | Discharge: 2018-03-30 | Disposition: A | Discharge status: Home / Self Care | Payer: Medicare Other | Attending: Emergency Medicine | Admitting: Emergency Medicine

## 2018-03-30 DIAGNOSIS — Z7901 Long term (current) use of anticoagulants: Secondary | ICD-10-CM | POA: Diagnosis not present

## 2018-03-30 DIAGNOSIS — Z86718 Personal history of other venous thrombosis and embolism: Secondary | ICD-10-CM | POA: Diagnosis not present

## 2018-03-30 DIAGNOSIS — Z79899 Other long term (current) drug therapy: Secondary | ICD-10-CM | POA: Diagnosis not present

## 2018-03-30 DIAGNOSIS — M79604 Pain in right leg: Secondary | ICD-10-CM | POA: Diagnosis present

## 2018-03-30 DIAGNOSIS — I82431 Acute embolism and thrombosis of right popliteal vein: Secondary | ICD-10-CM | POA: Diagnosis not present

## 2018-03-30 DIAGNOSIS — I10 Essential (primary) hypertension: Secondary | ICD-10-CM | POA: Diagnosis not present

## 2018-03-30 DIAGNOSIS — J449 Chronic obstructive pulmonary disease, unspecified: Secondary | ICD-10-CM | POA: Diagnosis not present

## 2018-03-30 DIAGNOSIS — Z87891 Personal history of nicotine dependence: Secondary | ICD-10-CM | POA: Diagnosis not present

## 2018-03-30 DIAGNOSIS — R079 Chest pain, unspecified: Secondary | ICD-10-CM | POA: Diagnosis not present

## 2018-03-30 LAB — CBC
HCT: 50 % (ref 39.0–52.0)
Hemoglobin: 17 g/dL (ref 13.0–17.0)
MCH: 29.7 pg (ref 26.0–34.0)
MCHC: 34 g/dL (ref 30.0–36.0)
MCV: 87.3 fL (ref 80.0–100.0)
Platelets: 138 10*3/uL — ABNORMAL LOW (ref 150–400)
RBC: 5.73 MIL/uL (ref 4.22–5.81)
RDW: 13.5 % (ref 11.5–15.5)
WBC: 9.8 10*3/uL (ref 4.0–10.5)
nRBC: 0 % (ref 0.0–0.2)

## 2018-03-30 LAB — BASIC METABOLIC PANEL
Anion gap: 11 (ref 5–15)
BUN: 15 mg/dL (ref 8–23)
CO2: 20 mmol/L — ABNORMAL LOW (ref 22–32)
Calcium: 9.1 mg/dL (ref 8.9–10.3)
Chloride: 98 mmol/L (ref 98–111)
Creatinine, Ser: 1.46 mg/dL — ABNORMAL HIGH (ref 0.61–1.24)
GFR calc Af Amer: 56 mL/min — ABNORMAL LOW (ref >60)
GFR calc non Af Amer: 48 mL/min — ABNORMAL LOW (ref >60)
Glucose, Bld: 106 mg/dL — ABNORMAL HIGH (ref 70–99)
Potassium: 3.8 mmol/L (ref 3.5–5.1)
Sodium: 129 mmol/L — ABNORMAL LOW (ref 135–145)

## 2018-03-30 LAB — PROTIME-INR
INR: 1.64
Prothrombin Time: 19.2 seconds — ABNORMAL HIGH (ref 11.4–15.2)

## 2018-03-30 LAB — TROPONIN I: Troponin I: <0.03 ng/mL (ref <0.03)

## 2018-03-30 MED ORDER — CYCLOBENZAPRINE HCL 5 MG PO TABS: 5.0000 mg | ORAL_TABLET | Freq: Three times a day (TID) | ORAL | 0 refills | Status: DC | PRN

## 2018-03-30 MED ORDER — MORPHINE SULFATE (PF) 4 MG/ML IV SOLN
4.0000 mg | Freq: Once | INTRAVENOUS | Status: DC
  Filled 2018-03-30: qty 1

## 2018-03-30 MED ORDER — SODIUM CHLORIDE 0.9 % IV BOLUS
1000.0000 mL | Freq: Once | INTRAVENOUS | Status: AC
  Administered 2018-03-30: 1000 mL via INTRAVENOUS

## 2018-03-30 MED ORDER — APIXABAN 5 MG PO TABS: 5.0000 mg | ORAL_TABLET | Freq: Two times a day (BID) | ORAL | 2 refills | Status: DC

## 2018-03-30 NOTE — ED Notes (Signed)
First nurse note  Presents with bilateral foot pain  States he had chest pain 2 days ago  Hx of DVT's in past

## 2018-03-30 NOTE — ED Triage Notes (Addendum)
Pt states his whole body is cramping. States pain in both feet and had pain in central chest 2 days ago. States any movement hurts. A&O, ambulatory. Denies leg swelling. Hx of blood clots, took eliquis yesterday. Unsure of weight gain, states "I haven't stepped on a scale.

## 2018-03-30 NOTE — ED Provider Notes (Signed)
[facility] Emergency Department Provider Note  Time seen: 4:10 PM  I have reviewed the triage vital signs and the nursing notes.   HISTORY  Chief Complaint Chest Pain and Foot Pain    HPI Zachary Sweeney is a 73 y.o. male with a past medical history of DVT, chest pain, COPD, hypertension, presents to the emergency department with complaints of leg pain right greater than left, concerned that he could have a blood clot.  According to the patient earlier in 2019 he was diagnosed with a blood clot in his right leg and placed on Eliquis.  Patient was taken off of this medication in November, he states over the past 1 week however he has begun to experience some pain in his right leg/foot again sometimes in the left foot as well.  Patient was concerned that he could be developing a blood clot once again so he started taking Eliquis once again over the past 1 week.  2 days ago patient states he had a sharp pain in his chest that went away.  Denies any chest pain currently.  Denies any shortness of breath.  No history of PE.  The patient states he was at church today when he mentioned it to a friend and they told him he should go to the ER.   Past Medical History:  Diagnosis Date  . Back injury   . Colon polyp   . DVT (deep venous thrombosis) ([place])    a. 06/2017 LE U/S: Right popliteal vein DVT.  Marland Kitchen Dyspnea on exertion    a. 06/2017 CTA Chest: No PE. No significant coronary Ca2+, centrilobular and paraseptal emphysema;  b.  07/2017 Echo: EF 60-65%, no rwma, Gr1 DD, mildly dil LA. Nl RV fxn; c. 07/2017 MV: Small, severe defect involving apical inf and apical segments - only on rest images consistent w/ artifact. No ischemia or scar.  . Emphysema of lung ([place])    a. 06/2017 CTA Chest: Centrilobular and paraseptal emphysema, with mild geographic ground-glass opacity likely representing small airway dzs.  Marland Kitchen Hypertension   . Torn ligament     Patient Active Problem List   Diagnosis Date Noted  . DOE (dyspnea on exertion) 07/19/2017  . Atypical chest pain 07/19/2017  . Deep vein thrombosis (DVT) of popliteal vein of right lower extremity ([place]) 07/19/2017  . Benign prostatic hyperplasia 06/25/2017  . Essential hypertension 06/25/2017  . Other dysphagia 01/31/2017  . Epigastric pain 01/31/2017  . Obesity, unspecified 06/26/2013    Past Surgical History:  Procedure Laterality Date  . COLONOSCOPY  2008  . COLONOSCOPY WITH PROPOFOL N/A 02/27/2017   Procedure: COLONOSCOPY WITH PROPOFOL;  Surgeon: Robert Bellow, MD;  Location: [facility] ENDOSCOPY;  Service: Endoscopy;  Laterality: N/A;  . ESOPHAGOGASTRODUODENOSCOPY (EGD) WITH PROPOFOL N/A 02/27/2017   Procedure: ESOPHAGOGASTRODUODENOSCOPY (EGD) WITH PROPOFOL;  Surgeon: Robert Bellow, MD;  Location: [facility] ENDOSCOPY;  Service: Endoscopy;  Laterality: N/A;  . HERNIA REPAIR    . KNEE SURGERY Left   . NECK SURGERY    . ROTATOR CUFF REPAIR Right     Prior to Admission medications   Medication Sig Start Date End Date Taking? Authorizing Provider  cetirizine (ZYRTEC ALLERGY) 10 MG tablet Take 1 tablet (10 mg total) by mouth daily. 09/06/17   Flora Lipps, MD  diltiazem (CARDIZEM CD) 300 MG 24 hr capsule Take 1 capsule (300 mg total) by mouth daily. 09/04/17   Theora Gianotti, NP  ELIQUIS 5 MG TABS tablet TAKE  1 TABLET BY MOUTH TWICE DAILY 02/17/18   End, Harrell Gave, MD  fluticasone-salmeterol (ADVAIR HFA) 115-21 MCG/ACT inhaler Inhale 2 puffs into the lungs 2 (two) times daily. 09/06/17   Flora Lipps, MD  omeprazole (PRILOSEC) 20 MG capsule Take 1 capsule (20 mg total) by mouth daily. 09/06/17 09/06/18  Flora Lipps, MD    No Known Allergies  Family History  Problem Relation Age of Onset  . Lung cancer Mother   . Heart disease Father   . Colon cancer Maternal Uncle     Social History Social History   Tobacco Use  . Smoking status: Former Smoker    Packs/day: 0.25    Years: 30.00    Pack years:  7.50    Types: Cigarettes    Last attempt to quit: 02/19/1994    Years since quitting: 24.1  . Smokeless tobacco: Never Used  Substance Use Topics  . Alcohol use: No    Frequency: Never  . Drug use: No    Review of Systems Constitutional: Negative for fever. Cardiovascular: Sharp chest pain 2 days ago, since resolved. Respiratory: Negative for shortness of breath. Gastrointestinal: Negative for abdominal pain, vomiting Genitourinary: Negative for urinary compaints Musculoskeletal: Intermittent leg pain right greater than left.  No edema. Skin: Negative for skin complaints  Neurological: Negative for headache All other ROS negative  ____________________________________________   PHYSICAL EXAM:  VITAL SIGNS: ED Triage Vitals  Enc Vitals Group     BP 03/30/18 1356 (!) 137/93     Pulse Rate 03/30/18 1356 (!) 117     Resp 03/30/18 1356 18     Temp 03/30/18 1356 98.8 F (37.1 C)     Temp Source 03/30/18 1356 Oral     SpO2 03/30/18 1356 95 %     Weight 03/30/18 1357 282 lb (127.9 kg)     Height 03/30/18 1357 6\' 1"  (1.854 m)     Head Circumference --      Peak Flow --      Pain Score 03/30/18 1357 7     Pain Loc --      Pain Edu? --      Excl. in [place]? --    Constitutional: Alert and oriented. Well appearing and in no distress. Eyes: Normal exam ENT   Head: Normocephalic and atraumatic.   Mouth/Throat: Mucous membranes are moist. Cardiovascular: Normal rate, regular rhythm.  Respiratory: Normal respiratory effort without tachypnea nor retractions. Breath sounds are clear Gastrointestinal: Soft and nontender. No distention.   Musculoskeletal: Nontender with normal range of motion in all extremities.  Neurologic:  Normal speech and language. No gross focal neurologic deficits  Skin:  Skin is warm, dry and intact.  Psychiatric: Mood and affect are normal.   ____________________________________________    EKG  EKG viewed and interpreted by myself shows sinus  tachycardia 110 bpm with a narrow QRS, normal axis, normal intervals, no concerning ST changes.  ____________________________________________    RADIOLOGY  Chest x-ray is clear.  ____________________________________________   INITIAL IMPRESSION / ASSESSMENT AND PLAN / ED COURSE  Pertinent labs & imaging results that were available during my care of the patient were reviewed by me and considered in my medical decision making (see chart for details).  Patient presents to the emergency department wishing to be evaluated for blood clot.  History of a DVT last year, is now off of Eliquis however he restarted this week due to pain in his right foot and leg and occasional pain in his left foot.  Differential at this time would include DVT, musculoskeletal pain, muscle strain, ACS.  We will check labs, [place] of lower ext, and troponin.    Patient's ultrasound does show a nonocclusive right lower extremity thrombus.  It is unclear if this is acute versus chronic.  Patient denies any chest pain, pulse rate is in the 80s, no trouble breathing, no pleuritic pain.  Satting 96 to 98% on room air.  Given the patient's ultrasound result being positive we will start on Eliquis twice daily the patient will follow-up with his doctor.  We will also prescribe Flexeril for any muscle cramping I discussed with the patient increasing salt intake as well as fluids over the next 2 weeks and following up with his doctor next week for recheck/reevaluation.  Patient agreeable to plan of care.  ____________________________________________   FINAL CLINICAL IMPRESSION(S) / ED DIAGNOSES  Chest pain Leg pain DVT   Harvest Dark, MD 03/30/18 1756

## 2018-03-30 NOTE — Discharge Instructions (Signed)
As we discussed please take your medications as prescribed.  Please drink plenty of fluids, increase salt intake and follow-up with your doctor next week for recheck/reevaluation.

## 2018-04-04 ENCOUNTER — Other Ambulatory Visit: Payer: Self-pay | Admitting: Family Medicine

## 2018-04-04 DIAGNOSIS — R1012 Left upper quadrant pain: Secondary | ICD-10-CM

## 2018-04-10 ENCOUNTER — Ambulatory Visit
Admission: RE | Admit: 2018-04-10 | Discharge: 2018-04-10 | Disposition: A | Discharge status: Home / Self Care | Payer: Medicare Other | Source: Ambulatory Visit | Attending: Family Medicine | Admitting: Family Medicine

## 2018-04-10 DIAGNOSIS — R1012 Left upper quadrant pain: Secondary | ICD-10-CM | POA: Diagnosis not present

## 2018-04-10 MED ORDER — IOHEXOL 300 MG/ML  SOLN
125.0000 mL | Freq: Once | INTRAMUSCULAR | Status: AC | PRN
  Administered 2018-04-10: 125 mL via INTRAVENOUS

## 2018-04-15 ENCOUNTER — Other Ambulatory Visit: Payer: Self-pay

## 2018-04-15 ENCOUNTER — Encounter: Payer: Self-pay | Admitting: Gastroenterology

## 2018-04-15 ENCOUNTER — Ambulatory Visit (INDEPENDENT_AMBULATORY_CARE_PROVIDER_SITE_OTHER): Payer: Medicare Other | Admitting: Gastroenterology

## 2018-04-15 VITALS — BP 134/79 | HR 79 | Ht 74.0 in | Wt 289.0 lb

## 2018-04-15 DIAGNOSIS — E878 Other disorders of electrolyte and fluid balance, not elsewhere classified: Secondary | ICD-10-CM

## 2018-04-15 DIAGNOSIS — R195 Other fecal abnormalities: Secondary | ICD-10-CM

## 2018-04-15 DIAGNOSIS — Z86718 Personal history of other venous thrombosis and embolism: Secondary | ICD-10-CM

## 2018-04-15 DIAGNOSIS — K439 Ventral hernia without obstruction or gangrene: Secondary | ICD-10-CM

## 2018-04-15 NOTE — Patient Instructions (Signed)
Referral for Hematology Referral for nephrology Referral for general surgery, Dr. Hampton Abbot for abdominal wall hernia  Expect calls to schedule these 3 referrals.

## 2018-04-16 ENCOUNTER — Other Ambulatory Visit: Payer: Self-pay | Admitting: Family Medicine

## 2018-04-16 DIAGNOSIS — R1012 Left upper quadrant pain: Secondary | ICD-10-CM

## 2018-04-16 LAB — COMPREHENSIVE METABOLIC PANEL
ALT: 16 IU/L (ref 0–44)
AST: 23 IU/L (ref 0–40)
Albumin/Globulin Ratio: 1.4 (ref 1.2–2.2)
Albumin: 4.2 g/dL (ref 3.8–4.8)
Alkaline Phosphatase: 60 IU/L (ref 39–117)
BUN/Creatinine Ratio: 8 — ABNORMAL LOW (ref 10–24)
BUN: 9 mg/dL (ref 8–27)
Bilirubin Total: 0.7 mg/dL (ref 0.0–1.2)
CO2: 22 mmol/L (ref 20–29)
Calcium: 9.6 mg/dL (ref 8.6–10.2)
Chloride: 101 mmol/L (ref 96–106)
Creatinine, Ser: 1.14 mg/dL (ref 0.76–1.27)
GFR calc Af Amer: 75 mL/min/{1.73_m2} (ref >59)
GFR calc non Af Amer: 65 mL/min/{1.73_m2} (ref >59)
Globulin, Total: 3.1 g/dL (ref 1.5–4.5)
Glucose: 92 mg/dL (ref 65–99)
Potassium: 4.8 mmol/L (ref 3.5–5.2)
Sodium: 139 mmol/L (ref 134–144)
Total Protein: 7.3 g/dL (ref 6.0–8.5)

## 2018-04-16 NOTE — Progress Notes (Signed)
Zachary Sweeney [address]  [place]  [place], [place] [address]  Main: [phone]  Fax: [phone]   Gastroenterology Consultation  Referring Provider:     [facility] [place] Physician:  [place] Reason for Consultation:     Abdominal pain, occult blood positive stool        HPI:    Chief Complaint  Patient presents with  . New Patient (Initial Visit)    ref. by [facility] for gen. abd. pain and other fecal abnormalities    Zachary Sweeney is a 73 y.o. y/o male referred for consultation & management  by Dr. [facility]  Patient reports chronic history, about 3 to 6 months of diffuse abdominal pain, and epigastric pain.  Unrelated to meals.  States it gets worse when he bends down.  No nausea or vomiting.  No weight loss.  No gross blood in stool.  No dysphagia recently.  Reports previous history of dysphagia about 2 years ago that has completely resolved..  Primary care provider did an occult blood test which was positive and patient was referred to [place].  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  He is on Eliquis that supposedly patient requested to discontinue and it was discontinued by his primary care provider and then patient went in recently to the hospital with lower extremity pain and was diagnosed with a DVT again.  Unclear if this was his previous DVT or an acute DVT.  He is back on Eliquis.  His blood work during the ER visit also showed elevated creatinine, hyponatremia, hypochloremia.  CT abdomen pelvis did not show any acute findings.  Small umbilical hernia containing only peritoneal fat was reported.  Liver was reported to be normal.  Patient had a colonoscopy in January 2019 by Dr. Talmage Coin due to previous history of colon polyps.  As per the report: "Two 5 to 12 mm polyps in the cecum, removed with a hot snare. Resected and retrieved. - Two benign appearing 6 to 8  mm polyps in the rectum, removed with a cold snare. Resected and retrieved. - The distal rectum and anal verge are normal on retroflexion view."  Prep was noted to be adequate.  He also had an EGD at the same time for dysphagia.  An nonobstructing Schatzki's ring was reported and was biopsied.  Patient did not have any dilation during this procedure.  DIAGNOSIS:  A. GASTROESOPHAGEAL JUNCTION; COLD BIOPSY:  - SQUAMOUS MUCOSA WITH REACTIVE CHANGES, UP TO 15 INTRAEPITHELIAL  EOSINOPHILS IN ONE HIGH POWER FIELD, AND A RARE INTRAEPITHELIAL  NEUTROPHIL, SEE COMMENT.  - NEGATIVE FOR DYSPLASIA AND MALIGNANCY.   Comment:  Additional deeper sections were reviewed on part A. The histologic  features are compatible with reflux esophagitis, but not specific. If  response to proton pump inhibitors is unsatisfactory, or there is  clinical suspicion for eosinophilic esophagitis, then proximal  esophageal biopsies may be helpful.   B. COLON POLYP X 2, CECUM; COLD AND HOT SNARE:  - TWO TUBULAR ADENOMAS; LARGER IS AT LEAST 7 MM AND SMALLER IS AT LEAST  4 MM.  - NEGATIVE FOR HIGH-GRADE DYSPLASIA AND MALIGNANCY.   C. RECTUM POLYP X 2; COLD SNARE:  - TUBULAR ADENOMA, NEGATIVE FOR HIGH-GRADE DYSPLASIA AND MALIGNANCY.  - HYPERPLASTIC POLYP, NEGATIVE FOR DYSPLASIA AND MALIGNANCY.   Past Medical History:  Diagnosis Date  . Acid reflux   . Back injury   .  Colon polyp   . DVT (deep venous thrombosis) ([place])    a. 06/2017 LE U/S: Right popliteal vein DVT.  Marland Kitchen Dyspnea on exertion    a. 06/2017 CTA Chest: No PE. No significant coronary Ca2+, centrilobular and paraseptal emphysema;  b.  07/2017 Echo: EF 60-65%, no rwma, Gr1 DD, mildly dil LA. Nl RV fxn; c. 07/2017 MV: Small, severe defect involving apical inf and apical segments - only on rest images consistent w/ artifact. No ischemia or scar.  . Emphysema of lung ([place])    a. 06/2017 CTA Chest: Centrilobular and paraseptal emphysema, with mild geographic  ground-glass opacity likely representing small airway dzs.  Marland Kitchen Hypertension   . Torn ligament     Past Surgical History:  Procedure Laterality Date  . COLONOSCOPY  2008  . COLONOSCOPY WITH PROPOFOL N/A 02/27/2017   Procedure: COLONOSCOPY WITH PROPOFOL;  Surgeon: Robert Bellow, MD;  Location: [facility] ENDOSCOPY;  Service: Endoscopy;  Laterality: N/A;  . ESOPHAGOGASTRODUODENOSCOPY (EGD) WITH PROPOFOL N/A 02/27/2017   Procedure: ESOPHAGOGASTRODUODENOSCOPY (EGD) WITH PROPOFOL;  Surgeon: Robert Bellow, MD;  Location: [facility] ENDOSCOPY;  Service: Endoscopy;  Laterality: N/A;  . HERNIA REPAIR    . KNEE SURGERY Left   . NECK SURGERY    . ROTATOR CUFF REPAIR Right     Prior to Admission medications   Medication Sig Start Date End Date Taking? Authorizing Provider  apixaban (ELIQUIS) 5 MG TABS tablet Take 1 tablet (5 mg total) by mouth 2 (two) times daily for 30 days. 03/30/18 04/29/18 Yes Paduchowski, Lennette Bihari, MD  diltiazem (CARDIZEM CD) 300 MG 24 hr capsule Take 1 capsule (300 mg total) by mouth daily. 09/04/17  Yes Theora Gianotti, NP  fluticasone-salmeterol (ADVAIR HFA) [phone] MCG/ACT inhaler Inhale 2 puffs into the lungs 2 (two) times daily. 09/06/17  Yes Flora Lipps, MD  omeprazole (PRILOSEC) 20 MG capsule Take 1 capsule (20 mg total) by mouth daily. 09/06/17 09/06/18 Yes Kasa, Maretta Bees, MD  PROAIR HFA 108 [phone] Base) MCG/ACT inhaler INL 2 PFS PO Q 4 TO 6 H PRN 04/03/18  Yes [provider]  SPIRIVA HANDIHALER 18 MCG inhalation capsule USE 1 [place] 04/08/18  Yes [provider]  cetirizine (ZYRTEC ALLERGY) 10 MG tablet Take 1 tablet (10 mg total) by mouth daily. Patient not taking: Reported on 04/15/2018 09/06/17   Flora Lipps, MD  cyclobenzaprine (FLEXERIL) 5 MG tablet Take 1 tablet (5 mg total) by mouth 3 (three) times daily as needed for muscle spasms. Patient not taking: Reported on 04/15/2018 03/30/18   Harvest Dark, MD    Family History    Problem Relation Age of Onset  . Lung cancer Mother   . Heart disease Father   . Colon cancer Maternal Uncle      Social History   Tobacco Use  . Smoking status: Former Smoker    Packs/day: 0.25    Years: 30.00    Pack years: 7.50    Types: Cigarettes    Last attempt to quit: 02/19/1994    Years since quitting: 24.1  . Smokeless tobacco: Never Used  Substance Use Topics  . Alcohol use: No    Frequency: Never  . Drug use: No    Allergies as of 04/15/2018  . (No Known Allergies)    Review of Systems:    All systems reviewed and negative except where noted in HPI.   Physical Exam:  BP 134/79   Pulse 79   Ht 6\' 2"  (1.88 m)  Wt 289 lb (131.1 kg)   BMI 37.11 kg/m  No LMP for male patient. Psych:  Alert and cooperative. Normal mood and affect. General:   Alert,  Well-developed, well-nourished, pleasant and cooperative in NAD Head:  Normocephalic and atraumatic. Eyes:  Sclera clear, no icterus.   Conjunctiva pink. Ears:  Normal auditory acuity. Nose:  No deformity, discharge, or lesions. Mouth:  No deformity or lesions,oropharynx pink & moist. Neck:  Supple; no masses or thyromegaly. Abdomen:  Normal bowel sounds.  No bruits.  Soft, non-tender and non-distended without masses, hepatosplenomegaly.abdominal wall hernia noted.  No guarding or rebound tenderness.    Msk:  Symmetrical without gross deformities. Good, equal movement & strength bilaterally. Pulses:  Normal pulses noted. Extremities:  No clubbing or edema.  No cyanosis. Neurologic:  Alert and oriented x3;  grossly normal neurologically. Skin:  Intact without significant lesions or rashes. No jaundice. Lymph Nodes:  No significant cervical adenopathy. Psych:  Alert and cooperative. Normal mood and affect.   Labs: CBC    Component Value Date/Time   WBC 9.8 03/30/2018 1401   RBC 5.73 03/30/2018 1401   HGB 17.0 03/30/2018 1401   HCT 50.0 03/30/2018 1401   PLT 138 (L) 03/30/2018 1401   MCV 87.3 03/30/2018  1401   MCH 29.7 03/30/2018 1401   MCHC 34.0 03/30/2018 1401   RDW 13.5 03/30/2018 1401   LYMPHSABS 2.3 06/25/2017 1846   MONOABS 0.5 06/25/2017 1846   EOSABS 0.2 06/25/2017 1846   BASOSABS 0.0 06/25/2017 1846   CMP     Component Value Date/Time   NA 139 04/15/2018 1431   K 4.8 04/15/2018 1431   CL 101 04/15/2018 1431   CO2 22 04/15/2018 1431   GLUCOSE 92 04/15/2018 1431   GLUCOSE 106 (H) 03/30/2018 1401   BUN 9 04/15/2018 1431   CREATININE 1.14 04/15/2018 1431   CALCIUM 9.6 04/15/2018 1431   PROT 7.3 04/15/2018 1431   ALBUMIN 4.2 04/15/2018 1431   AST 23 04/15/2018 1431   ALT 16 04/15/2018 1431   ALKPHOS 60 04/15/2018 1431   BILITOT 0.7 04/15/2018 1431   GFRNONAA 65 04/15/2018 1431   GFRAA 75 04/15/2018 1431    Imaging Studies: Dg Chest 2 View  Result Date: 03/30/2018 CLINICAL DATA:  73 year old male with history of abdominal pain and shortness of breath. EXAM: CHEST - 2 VIEW COMPARISON:  No priors. FINDINGS: Lung volumes are normal. No consolidative airspace disease. No pleural effusions. No pneumothorax. No pulmonary nodule or mass noted. Pulmonary vasculature and the cardiomediastinal silhouette are within normal limits. IMPRESSION: No radiographic evidence of acute cardiopulmonary disease. Electronically Signed   By: Vinnie Langton M.D.   On: 03/30/2018 15:02   Ct Abdomen Pelvis W Contrast  Result Date: 04/10/2018 CLINICAL DATA:  Lower abdominal pain 2 years. EXAM: CT ABDOMEN AND PELVIS WITH CONTRAST TECHNIQUE: Multidetector CT imaging of the abdomen and pelvis was performed using the standard protocol following bolus administration of intravenous contrast. CONTRAST:  144mL OMNIPAQUE IOHEXOL 300 MG/ML  SOLN COMPARISON:  06/25/2013 FINDINGS: Lower chest: Lung bases are within normal. Hepatobiliary: Gallbladder, liver and biliary tree are normal. Pancreas: Normal. Spleen: Normal. Adrenals/Urinary Tract: Adrenal glands are normal. Kidneys are normal in size without  hydronephrosis or nephrolithiasis. Possible punctate nonobstructing stone over the lower pole right kidney. Ureters and bladder are normal. Stomach/Bowel: Stomach and small bowel are normal. Minimal diverticulosis of the sigmoid colon. Appendix is normal. Vascular/Lymphatic: Mild calcified plaque over the abdominal aorta. No adenopathy. Reproductive: Normal. Other:  No free fluid or focal inflammatory change. Small umbilical hernia containing only peritoneal fat. Musculoskeletal: Minimal degenerate change of the spine and hips. IMPRESSION: No acute findings in the abdomen/pelvis. Nonobstructing punctate stone over the lower pole collecting system of the right kidney. Small umbilical hernia containing only peritoneal fat. Aortic Atherosclerosis (ICD10-I70.0). Electronically Signed   By: Marin Olp M.D.   On: 04/10/2018 16:38   US Venous Img Lower Bilateral  Result Date: 03/30/2018 CLINICAL DATA:  RIGHT leg pain, occasional LEFT leg pain, history DVT EXAM: BILATERAL LOWER EXTREMITY VENOUS DOPPLER ULTRASOUND TECHNIQUE: Gray-scale sonography with graded compression, as well as color Doppler and duplex ultrasound were performed to evaluate the lower extremity deep venous systems from the level of the common femoral vein and including the common femoral, femoral, profunda femoral, popliteal and calf veins including the posterior tibial, peroneal and gastrocnemius veins when visible. The superficial great saphenous vein was also interrogated. Spectral Doppler was utilized to evaluate flow at rest and with distal augmentation maneuvers in the common femoral, femoral and popliteal veins. COMPARISON:  LEFT lower extremity venous ultrasound 06/25/2017 FINDINGS: RIGHT LOWER EXTREMITY Common Femoral Vein: No evidence of thrombus. Normal compressibility, respiratory phasicity and response to augmentation. Saphenofemoral Junction: No evidence of thrombus. Normal compressibility and flow on color Doppler imaging. Profunda  Femoral Vein: No evidence of thrombus. Normal compressibility and flow on color Doppler imaging. Femoral Vein: Hypoechoic nonocclusive thrombus within RIGHT popliteal vein. Impaired spontaneous flow and incomplete compressibility. Popliteal Vein: No evidence of thrombus. Normal compressibility, respiratory phasicity and response to augmentation. Calf Veins: No evidence of thrombus. Normal compressibility and flow on color Doppler imaging. Superficial Great Saphenous Vein: No evidence of thrombus. Normal compressibility. Venous Reflux:  None. Other Findings:  None. LEFT LOWER EXTREMITY Common Femoral Vein: No evidence of thrombus. Normal compressibility, respiratory phasicity and response to augmentation. Saphenofemoral Junction: No evidence of thrombus. Normal compressibility and flow on color Doppler imaging. Profunda Femoral Vein: No evidence of thrombus. Normal compressibility and flow on color Doppler imaging. Femoral Vein: No evidence of thrombus. Normal compressibility, respiratory phasicity and response to augmentation. Popliteal Vein: No evidence of thrombus. Normal compressibility, respiratory phasicity and response to augmentation. Calf Veins: No evidence of thrombus. Normal compressibility and flow on color Doppler imaging. Superficial Great Saphenous Vein: No evidence of thrombus. Normal compressibility. Venous Reflux:  None. Other Findings:  None. IMPRESSION: Small amount of nonocclusive thrombus within the RIGHT popliteal vein consistent with deep venous thrombosis of the RIGHT lower extremity. No evidence of deep venous thrombosis LEFT lower extremity. Electronically Signed   By: Lavonia Dana M.D.   On: 03/30/2018 17:04    Assessment and Plan:   JERIK FALLETTA is a 73 y.o. y/o male has been referred for abdominal pain, positive FOBT  Given patient's recent DVT and now he has been restarted on Eliquis, we will need to determine when his Eliquis would be safe to hold prior to any endoscopic  procedures.  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  Will refer to hematology for further evaluation and to determine if Eliquis can be stopped safely for his endoscopic procedures.  Given his electrolyte abnormalities recently seen in the ER, I repeated his CMP today which shows that the hyponatremia has completely resolved.  His creatinine is better as well.  Given that patient is complaining of pain on bending, and an abdominal wall hernia is present, will refer patient to Dr. Hampton Abbot of surgery for further evaluation.  After his above work-up and referrals are completed, we can proceed with scheduling him for colonoscopy for his positive fecal occult blood test.  However, given his recent colonoscopy a year ago, risk of malignancy is low.  But the prep on that colonoscopy is reported to be adequate.  In addition I have reviewed the images personally, and since the prep does not appear to be a good or excellent prep, small lesions or flat lesions could have been missed during that procedure.  We will review his previous history of dysphagia on his next visit again.  He denies any dysphagia on today's visit.  However biopsies during his last EGD showed eosinophils, and a repeat EGD with biopsies of the esophagus may be beneficial.  Over 50% of the time was spent reviewing records, answering patient's questions, and coordinating his care  Dr Zachary Sweeney  Speech recognition software was used to dictate the above note.

## 2018-04-23 ENCOUNTER — Encounter: Payer: Self-pay | Admitting: Oncology

## 2018-04-23 ENCOUNTER — Inpatient Hospital Stay: Payer: Medicare Other | Attending: Oncology | Admitting: Oncology

## 2018-04-23 ENCOUNTER — Other Ambulatory Visit: Payer: Self-pay

## 2018-04-23 VITALS — BP 122/83 | HR 89 | Temp 96.6°F | Resp 18 | Ht 73.5 in | Wt 289.0 lb

## 2018-04-23 DIAGNOSIS — Z7901 Long term (current) use of anticoagulants: Secondary | ICD-10-CM

## 2018-04-23 DIAGNOSIS — K439 Ventral hernia without obstruction or gangrene: Secondary | ICD-10-CM | POA: Diagnosis not present

## 2018-04-23 DIAGNOSIS — I10 Essential (primary) hypertension: Secondary | ICD-10-CM

## 2018-04-23 DIAGNOSIS — Z87891 Personal history of nicotine dependence: Secondary | ICD-10-CM | POA: Diagnosis not present

## 2018-04-23 DIAGNOSIS — I82401 Acute embolism and thrombosis of unspecified deep veins of right lower extremity: Secondary | ICD-10-CM

## 2018-04-23 DIAGNOSIS — Z79899 Other long term (current) drug therapy: Secondary | ICD-10-CM | POA: Insufficient documentation

## 2018-04-23 DIAGNOSIS — Z86718 Personal history of other venous thrombosis and embolism: Secondary | ICD-10-CM | POA: Insufficient documentation

## 2018-04-23 DIAGNOSIS — I82431 Acute embolism and thrombosis of right popliteal vein: Secondary | ICD-10-CM

## 2018-04-23 NOTE — Progress Notes (Signed)
Hematology/Oncology Consult note [facility] [phone] [phone]   Patient Care Team: [place] as PCP - General End, Harrell Gave, MD as PCP - Cardiology (Cardiology)  REFERRING PROVIDER: Dr.Tahiliani Margretta Sidle CHIEF COMPLAINTS/REASON FOR VISIT:  Evaluation of DVT  HISTORY OF PRESENTING ILLNESS:  Zachary Sweeney is a  73 y.o.  male with PMH listed below who was referred to me for evaluation of DVT Patient recently established care with gastroenterology for consultation and management of chronic history of diffuse abdominal pain as well as epigastric pain.  Not related to meals, exacerbated when he bends down.  Not associated with nausea or vomiting. Denies any weight loss, fever, chills, night sweats.  Appetite is fair. Denies hematochezia, hematuria, hematemesis, epistaxis, black tarry stool or easy bruising.  Endoscopy procedures were recommended for further evaluation.  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  Patient was referred by gastroenterology to me for further evaluation and peri-procedure anticoagulation recommendation. Patient also was found to have an abdominal wall hernia which caused this pain with bending down.  He was also referred to see surgery Dr. Hampton Abbot for further evaluation.  06/25/2017, patient had US venous bilateral lower extremity which showed a focal area of partial compressibility popliteal Vein on the right, compatible with a partially occlusive DVT.  The borders are smooth and this has a subacute appearance. Left lower extremity no DVT.   Patient recalls that he had right foot pain and swelling, as the initial presentation which prompted ultrasound evaluation. Patient was started on Eliquis after ED discussed his case with vascular surgery Dr. Lucky Cowboy. Patient was taken off Eliquis in November 2019. Presented to [facility] emergency room on 03/30/2018 complaining right  lower extremity pain and swelling again which is similar to when he had a blood clot.  Patient was concerned that he could have developed blood clots again so he restarted taking Eliquis for the past week prior to presenting to the emergency room.  He mentions to a friend at the church and they urged him to go to emergency room. In the ED, ultrasound showed nonocclusive right lower extremity DVT. 04/10/2018, he had CT abdomen pelvis done for evaluation of chronic abdominal pain.  CT showed no acute findings in the abdomen and pelvis.  Nonobstructive punctate stones over the lower pole collecting system of the right kidney.  Small umbilical hernia containing only peritoneal fat.  Aortic atherosclerosis.  Patient reports feeling well today.  Right lower extremity pain and swelling has improved.  He is taking Eliquis 5 mg twice daily since February 2020.  Tolerates well.  No bleeding events.  Review of Systems  Constitutional: Negative for appetite change, chills, fatigue, fever and unexpected weight change.  HENT:   Negative for hearing loss and voice change.   Eyes: Negative for eye problems and icterus.  Respiratory: Negative for chest tightness, cough and shortness of breath.   Cardiovascular: Negative for chest pain and leg swelling.  Gastrointestinal: Positive for abdominal pain. Negative for abdominal distention, nausea and vomiting.  Endocrine: Negative for hot flashes.  Genitourinary: Negative for difficulty urinating, dysuria and frequency.   Musculoskeletal: Negative for arthralgias.  Skin: Negative for itching and rash.  Neurological: Negative for light-headedness and numbness.  Hematological: Negative for adenopathy. Does not bruise/bleed easily.  Psychiatric/Behavioral: Negative for confusion.    MEDICAL HISTORY:  Past Medical History:  Diagnosis Date  . Acid reflux   . Back injury   . Colon polyp   .  DVT (deep venous thrombosis) ([place])    a. 06/2017 LE U/S: Right popliteal vein  DVT.  Marland Kitchen Dyspnea on exertion    a. 06/2017 CTA Chest: No PE. No significant coronary Ca2+, centrilobular and paraseptal emphysema;  b.  07/2017 Echo: EF 60-65%, no rwma, Gr1 DD, mildly dil LA. Nl RV fxn; c. 07/2017 MV: Small, severe defect involving apical inf and apical segments - only on rest images consistent w/ artifact. No ischemia or scar.  . Emphysema of lung ([place])    a. 06/2017 CTA Chest: Centrilobular and paraseptal emphysema, with mild geographic ground-glass opacity likely representing small airway dzs.  Marland Kitchen Hypertension   . Torn ligament     SURGICAL HISTORY: Past Surgical History:  Procedure Laterality Date  . COLONOSCOPY  2008  . COLONOSCOPY WITH PROPOFOL N/A 02/27/2017   Procedure: COLONOSCOPY WITH PROPOFOL;  Surgeon: Robert Bellow, MD;  Location: [facility] ENDOSCOPY;  Service: Endoscopy;  Laterality: N/A;  . ESOPHAGOGASTRODUODENOSCOPY (EGD) WITH PROPOFOL N/A 02/27/2017   Procedure: ESOPHAGOGASTRODUODENOSCOPY (EGD) WITH PROPOFOL;  Surgeon: Robert Bellow, MD;  Location: [facility] ENDOSCOPY;  Service: Endoscopy;  Laterality: N/A;  . HERNIA REPAIR    . KNEE SURGERY Left   . NECK SURGERY    . ROTATOR CUFF REPAIR Right     SOCIAL HISTORY: Social History   Socioeconomic History  . Marital status: Single    Spouse name: Not on file  . Number of children: Not on file  . Years of education: Not on file  . Highest education level: Not on file  Occupational History  . Occupation: retired  Scientific laboratory technician  . Financial resource strain: Not on file  . Food insecurity:    Worry: Not on file    Inability: Not on file  . Transportation needs:    Medical: Not on file    Non-medical: Not on file  Tobacco Use  . Smoking status: Former Smoker    Packs/day: 0.25    Years: 30.00    Pack years: 7.50    Types: Cigarettes    Last attempt to quit: 02/19/1994    Years since quitting: 24.1  . Smokeless tobacco: Never Used  Substance and Sexual Activity  . Alcohol use: No    Frequency: Never    . Drug use: No  . Sexual activity: Not on file  Lifestyle  . Physical activity:    Days per week: Not on file    Minutes per session: Not on file  . Stress: Not on file  Relationships  . Social connections:    Talks on phone: Not on file    Gets together: Not on file    Attends religious service: Not on file    Active member of club or organization: Not on file    Attends meetings of clubs or organizations: Not on file    Relationship status: Not on file  . Intimate partner violence:    Fear of current or ex partner: Not on file    Emotionally abused: Not on file    Physically abused: Not on file    Forced sexual activity: Not on file  Other Topics Concern  . Not on file  Social History Narrative  . Not on file    FAMILY HISTORY: Family History  Problem Relation Age of Onset  . Lung cancer Mother   . Heart disease Father   . Prostate cancer Maternal Uncle     ALLERGIES:  has No Known Allergies.  MEDICATIONS:  Current Outpatient  Medications  Medication Sig Dispense Refill  . apixaban (ELIQUIS) 5 MG TABS tablet Take 1 tablet (5 mg total) by mouth 2 (two) times daily for 30 days. 60 tablet 2  . diltiazem (CARDIZEM CD) 300 MG 24 hr capsule Take 1 capsule (300 mg total) by mouth daily. 90 capsule 3  . fluticasone-salmeterol (ADVAIR HFA) 115-21 MCG/ACT inhaler Inhale 2 puffs into the lungs 2 (two) times daily. 1 Inhaler 12  . ketoconazole (NIZORAL) 2 % cream APP AA ON SKIN BID    . omeprazole (PRILOSEC) 20 MG capsule Take 1 capsule (20 mg total) by mouth daily. 30 capsule 6  . PROAIR HFA 108 (90 Base) MCG/ACT inhaler INL 2 PFS PO Q 4 TO 6 H PRN    . SPIRIVA HANDIHALER 18 MCG inhalation capsule USE 1 INHALATION VIA HANDIHALER DAILY     No current facility-administered medications for this visit.      PHYSICAL EXAMINATION: ECOG PERFORMANCE STATUS: 0 - Asymptomatic Vitals:   04/23/18 1513  BP: 122/83  Pulse: 89  Resp: 18  Temp: (!) 96.6 F (35.9 C)   Filed  Weights   04/23/18 1513  Weight: 289 lb (131.1 kg)    Physical Exam Constitutional:      General: He is not in acute distress. HENT:     Head: Normocephalic and atraumatic.  Eyes:     General: No scleral icterus.    Pupils: Pupils are equal, round, and reactive to light.  Neck:     Musculoskeletal: Normal range of motion and neck supple.  Cardiovascular:     Rate and Rhythm: Normal rate and regular rhythm.     Heart sounds: Normal heart sounds.  Pulmonary:     Effort: Pulmonary effort is normal. No respiratory distress.     Breath sounds: No wheezing.  Abdominal:     General: Bowel sounds are normal. There is no distension.     Palpations: Abdomen is soft. There is no mass.     Tenderness: There is no abdominal tenderness.  Musculoskeletal: Normal range of motion.        General: No deformity.  Skin:    General: Skin is warm and dry.     Findings: No erythema or rash.  Neurological:     Mental Status: He is alert and oriented to person, place, and time.     Cranial Nerves: No cranial nerve deficit.     Coordination: Coordination normal.  Psychiatric:        Behavior: Behavior normal.        Thought Content: Thought content normal.     RADIOGRAPHIC STUDIES: I have personally reviewed the radiological images as listed and agreed with the findings in the report.  CMP Latest Ref Rng & Units 04/15/2018  Glucose 65 - 99 mg/dL 92  BUN 8 - 27 mg/dL 9  Creatinine 0.76 - 1.27 mg/dL 1.14  Sodium 134 - 144 mmol/L 139  Potassium 3.5 - 5.2 mmol/L 4.8  Chloride 96 - 106 mmol/L 101  CO2 20 - 29 mmol/L 22  Calcium 8.6 - 10.2 mg/dL 9.6  Total Protein 6.0 - 8.5 g/dL 7.3  Total Bilirubin 0.0 - 1.2 mg/dL 0.7  Alkaline Phos 39 - 117 IU/L 60  AST 0 - 40 IU/L 23  ALT 0 - 44 IU/L 16   CBC Latest Ref Rng & Units 03/30/2018  WBC 4.0 - 10.5 K/uL 9.8  Hemoglobin 13.0 - 17.0 g/dL 17.0  Hematocrit 39.0 - 52.0 % 50.0  Platelets  150 - 400 K/uL 138(L)    LABORATORY DATA:  I have reviewed  the data as listed Lab Results  Component Value Date   WBC 9.8 03/30/2018   HGB 17.0 03/30/2018   HCT 50.0 03/30/2018   MCV 87.3 03/30/2018   PLT 138 (L) 03/30/2018   Recent Labs    06/25/17 1846 03/30/18 1401 04/15/18 1431  NA 136 129* 139  K 4.1 3.8 4.8  CL 102 98 101  CO2 27 20* 22  GLUCOSE 90 106* 92  BUN 14 15 9   CREATININE 1.13 1.46* 1.14  CALCIUM 9.3 9.1 9.6  GFRNONAA >60 48* 65  GFRAA >60 56* 75  PROT  --   --  7.3  ALBUMIN  --   --  4.2  AST  --   --  23  ALT  --   --  16  ALKPHOS  --   --  60  BILITOT  --   --  0.7   Iron/TIBC/Ferritin/ %Sat No results found for: IRON, TIBC, FERRITIN, IRONPCTSAT      ASSESSMENT & PLAN:  1. Acute deep vein thrombosis (DVT) of popliteal vein of right lower extremity (HCC)   2. Chronic anticoagulation   3. 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  Given the history that he was off anticoagulation and developed similar symptoms including right foot pain and swelling prior to discovery of right lower extremity DVT, most likely this is a recurrent acute DVT. Agree.  Anticoagulation.  Recommend 3 to 6 months of full strength anticoagulation and then switch to Eliquis 2.5 mg twice daily for maintenance.  Duration of anticoagulation long-term.  #.For perioperation anticoagulation, given that he had an acute recurrent right lower extremity DVT event within the past 30 days, I recommend patient continue uninterrupted anticoagulation for 3 months prior to elective procedures. If his symptoms allows and if okay with gastroenterology, I would recommend repeat RLE [place] in 2 months and if no clot burden, okay to stop Eliquis for 3 days prior to invasive procedures, resume 48 hours after procedure. His CT abdomen pelvis did not show acute findings which is  reassuring.  However if there is urgency for emergency of procedures, recommend IVC filter placement prior to interruption of anticoagulation for invasive procedure.  Orders Placed This Encounter  Procedures  . US Venous Img Lower Unilateral Right    Standing Status:   Future    Standing Expiration Date:   04/23/2019    Order Specific Question:   Reason for Exam (SYMPTOM  OR DIAGNOSIS REQUIRED)    Answer:   Follow up on DVT    Order Specific Question:   Preferred imaging location?    Answer:   [facility]  . CBC with Differential/Platelet    Standing Status:   Future    Standing Expiration Date:   04/23/2019  . Comprehensive metabolic panel    Standing Status:   Future    Standing Expiration Date:   04/23/2019    All questions were answered. The patient knows to call the clinic with any problems questions or concerns.  Return of visit: 2 months to discuss repeat ultrasound results and management plan. Thank you for this kind referral and the opportunity to participate in the care of this patient. A copy of today's note is routed  to referring provider  Total face to face encounter time for this patient visit was 45 min. >50% of the time was  spent in counseling and coordination of care.    Earlie Server, MD, PhD Hematology Oncology [facility] at [facility] Pager- [phone] 04/23/2018

## 2018-04-23 NOTE — Progress Notes (Signed)
Patient here for initial visit. Pt states he went to hospital about 3 weeks ago due to dehydration. He complains of excessive sweating.

## 2018-04-29 ENCOUNTER — Ambulatory Visit: Payer: Medicare Other | Admitting: General Surgery

## 2018-05-02 ENCOUNTER — Encounter: Payer: Medicare Other | Admitting: Hematology and Oncology

## 2018-05-29 ENCOUNTER — Ambulatory Visit: Payer: Medicare Other | Admitting: Gastroenterology

## 2018-06-26 ENCOUNTER — Other Ambulatory Visit: Payer: Self-pay

## 2018-06-26 DIAGNOSIS — I82431 Acute embolism and thrombosis of right popliteal vein: Secondary | ICD-10-CM

## 2018-06-27 ENCOUNTER — Ambulatory Visit: Admission: RE | Admit: 2018-06-27 | Payer: Medicare Other | Source: Ambulatory Visit

## 2018-06-30 ENCOUNTER — Other Ambulatory Visit: Payer: Self-pay

## 2018-07-01 ENCOUNTER — Other Ambulatory Visit: Payer: Self-pay

## 2018-07-01 ENCOUNTER — Inpatient Hospital Stay: Payer: Medicare Other | Attending: Oncology | Admitting: *Deleted

## 2018-07-01 DIAGNOSIS — J439 Emphysema, unspecified: Secondary | ICD-10-CM | POA: Insufficient documentation

## 2018-07-01 DIAGNOSIS — Z79899 Other long term (current) drug therapy: Secondary | ICD-10-CM | POA: Insufficient documentation

## 2018-07-01 DIAGNOSIS — K219 Gastro-esophageal reflux disease without esophagitis: Secondary | ICD-10-CM | POA: Diagnosis not present

## 2018-07-01 DIAGNOSIS — Z7901 Long term (current) use of anticoagulants: Secondary | ICD-10-CM | POA: Insufficient documentation

## 2018-07-01 DIAGNOSIS — I10 Essential (primary) hypertension: Secondary | ICD-10-CM | POA: Diagnosis not present

## 2018-07-01 DIAGNOSIS — I82431 Acute embolism and thrombosis of right popliteal vein: Secondary | ICD-10-CM | POA: Insufficient documentation

## 2018-07-01 DIAGNOSIS — Z87891 Personal history of nicotine dependence: Secondary | ICD-10-CM | POA: Insufficient documentation

## 2018-07-01 LAB — CBC WITH DIFFERENTIAL/PLATELET
Abs Immature Granulocytes: 0.02 10*3/uL (ref 0.00–0.07)
Basophils Absolute: 0.1 10*3/uL (ref 0.0–0.1)
Basophils Relative: 1 %
Eosinophils Absolute: 0.2 10*3/uL (ref 0.0–0.5)
Eosinophils Relative: 2 %
HCT: 49.4 % (ref 39.0–52.0)
Hemoglobin: 17 g/dL (ref 13.0–17.0)
Immature Granulocytes: 0 %
Lymphocytes Relative: 31 %
Lymphs Abs: 2.4 10*3/uL (ref 0.7–4.0)
MCH: 30.3 pg (ref 26.0–34.0)
MCHC: 34.4 g/dL (ref 30.0–36.0)
MCV: 88.1 fL (ref 80.0–100.0)
Monocytes Absolute: 0.7 10*3/uL (ref 0.1–1.0)
Monocytes Relative: 9 %
Neutro Abs: 4.4 10*3/uL (ref 1.7–7.7)
Neutrophils Relative %: 57 %
Platelets: 153 10*3/uL (ref 150–400)
RBC: 5.61 MIL/uL (ref 4.22–5.81)
RDW: 13.5 % (ref 11.5–15.5)
WBC: 7.8 10*3/uL (ref 4.0–10.5)
nRBC: 0 % (ref 0.0–0.2)

## 2018-07-01 LAB — COMPREHENSIVE METABOLIC PANEL
ALT: 19 U/L (ref 0–44)
AST: 23 U/L (ref 15–41)
Albumin: 4 g/dL (ref 3.5–5.0)
Alkaline Phosphatase: 56 U/L (ref 38–126)
Anion gap: 6 (ref 5–15)
BUN: 15 mg/dL (ref 8–23)
CO2: 25 mmol/L (ref 22–32)
Calcium: 9.3 mg/dL (ref 8.9–10.3)
Chloride: 105 mmol/L (ref 98–111)
Creatinine, Ser: 1.22 mg/dL (ref 0.61–1.24)
GFR calc Af Amer: >60 mL/min (ref >60)
GFR calc non Af Amer: 60 mL/min — ABNORMAL LOW (ref >60)
Glucose, Bld: 96 mg/dL (ref 70–99)
Potassium: 4 mmol/L (ref 3.5–5.1)
Sodium: 136 mmol/L (ref 135–145)
Total Bilirubin: 1 mg/dL (ref 0.3–1.2)
Total Protein: 7.7 g/dL (ref 6.5–8.1)

## 2018-07-02 ENCOUNTER — Encounter: Payer: Self-pay | Admitting: Oncology

## 2018-07-02 ENCOUNTER — Inpatient Hospital Stay: Payer: Medicare Other

## 2018-07-02 ENCOUNTER — Inpatient Hospital Stay (HOSPITAL_BASED_OUTPATIENT_CLINIC_OR_DEPARTMENT_OTHER): Payer: Medicare Other | Admitting: Oncology

## 2018-07-02 DIAGNOSIS — I82401 Acute embolism and thrombosis of unspecified deep veins of right lower extremity: Secondary | ICD-10-CM | POA: Diagnosis not present

## 2018-07-02 DIAGNOSIS — Z7901 Long term (current) use of anticoagulants: Secondary | ICD-10-CM

## 2018-07-02 DIAGNOSIS — R195 Other fecal abnormalities: Secondary | ICD-10-CM

## 2018-07-02 DIAGNOSIS — Z79899 Other long term (current) drug therapy: Secondary | ICD-10-CM

## 2018-07-02 DIAGNOSIS — I82431 Acute embolism and thrombosis of right popliteal vein: Secondary | ICD-10-CM | POA: Diagnosis not present

## 2018-07-02 DIAGNOSIS — Z87891 Personal history of nicotine dependence: Secondary | ICD-10-CM

## 2018-07-02 NOTE — Progress Notes (Signed)
Patient contacted for telehealth visit. Pt states he has had a sore throat for about 4 weeks. Complains of periodic abdomen pain.

## 2018-07-02 NOTE — Progress Notes (Signed)
HEMATOLOGY-ONCOLOGY TeleHEALTH VISIT PROGRESS NOTE  I connected with Zachary Sweeney on 07/02/18 at  1:45 PM EDT by video enabled telemedicine visit and verified that I am speaking with the correct person using two identifiers. I discussed the limitations, risks, security and privacy concerns of performing an evaluation and management service by telemedicine and the availability of in-person appointments. I also discussed with the patient that there may be a patient responsible charge related to this service. The patient expressed understanding and agreed to proceed.   Other persons participating in the visit and their role in the encounter:   Zachary Merl, RN, check in patient.   Patient's location: Home  Provider's location: Home office Chief Complaint: Follow-up for management of anticoagulation for acute DVT of lower extremity.   INTERVAL HISTORY Zachary Sweeney is a 73 y.o. male who has above history reviewed by me today presents for follow up visit for management of anticoagulation for acute DVT of lower extremity. Problems and complaints are listed below:  Patient no showed to his US venous duplex appt on 06/27/2018.  He reports being complaint with Eliquis 5mg  BID, reports tolerating well.  Breast exam was performed in seated and lying down position. He continues to have chronic stomach discomfort, previous positive for occult blood in stool.   History of acute nonocclusive thrombus within the right popliteal vein. Started on Anticoagulation with Eliquis.  Report lower extremity pain and swelling have improved.    Review of Systems  Constitutional: Positive for fatigue. Negative for appetite change, chills, fever and unexpected weight change.  HENT:   Negative for hearing loss and voice change.   Eyes: Negative for eye problems and icterus.  Respiratory: Negative for chest tightness, cough and shortness of breath.   Cardiovascular: Negative for chest pain and leg swelling.   Gastrointestinal: Negative for abdominal distention and abdominal pain.       Stomach discomfort  Endocrine: Negative for hot flashes.  Genitourinary: Negative for difficulty urinating, dysuria and frequency.   Musculoskeletal: Negative for arthralgias.  Skin: Negative for itching and rash.  Neurological: Negative for light-headedness and numbness.  Hematological: Negative for adenopathy. Bruises/bleeds easily.  Psychiatric/Behavioral: Negative for confusion.    Past Medical History:  Diagnosis Date  . Acid reflux   . Back injury   . Colon polyp   . DVT (deep venous thrombosis) ([place])    a. 06/2017 LE U/S: Right popliteal vein DVT.  Marland Kitchen Dyspnea on exertion    a. 06/2017 CTA Chest: No PE. No significant coronary Ca2+, centrilobular and paraseptal emphysema;  b.  07/2017 Echo: EF 60-65%, no rwma, Gr1 DD, mildly dil LA. Nl RV fxn; c. 07/2017 MV: Small, severe defect involving apical inf and apical segments - only on rest images consistent w/ artifact. No ischemia or scar.  . Emphysema of lung ([place])    a. 06/2017 CTA Chest: Centrilobular and paraseptal emphysema, with mild geographic ground-glass opacity likely representing small airway dzs.  Marland Kitchen Hypertension   . Torn ligament    Past Surgical History:  Procedure Laterality Date  . COLONOSCOPY  2008  . COLONOSCOPY WITH PROPOFOL N/A 02/27/2017   Procedure: COLONOSCOPY WITH PROPOFOL;  Surgeon: Robert Bellow, MD;  Location: [facility] ENDOSCOPY;  Service: Endoscopy;  Laterality: N/A;  . ESOPHAGOGASTRODUODENOSCOPY (EGD) WITH PROPOFOL N/A 02/27/2017   Procedure: ESOPHAGOGASTRODUODENOSCOPY (EGD) WITH PROPOFOL;  Surgeon: Robert Bellow, MD;  Location: [facility] ENDOSCOPY;  Service: Endoscopy;  Laterality: N/A;  . HERNIA REPAIR    . KNEE SURGERY  Left   . NECK SURGERY    . ROTATOR CUFF REPAIR Right     Family History  Problem Relation Age of Onset  . Lung cancer Mother   . Heart disease Father   . Prostate cancer Maternal Uncle     Social History    Socioeconomic History  . Marital status: Single    Spouse name: Not on file  . Number of children: Not on file  . Years of education: Not on file  . Highest education level: Not on file  Occupational History  . Occupation: retired  Scientific laboratory technician  . Financial resource strain: Not on file  . Food insecurity:    Worry: Not on file    Inability: Not on file  . Transportation needs:    Medical: Not on file    Non-medical: Not on file  Tobacco Use  . Smoking status: Former Smoker    Packs/day: 0.25    Years: 30.00    Pack years: 7.50    Types: Cigarettes    Last attempt to quit: 02/19/1994    Years since quitting: 24.3  . Smokeless tobacco: Never Used  Substance and Sexual Activity  . Alcohol use: No    Frequency: Never  . Drug use: No  . Sexual activity: Not on file  Lifestyle  . Physical activity:    Days per week: Not on file    Minutes per session: Not on file  . Stress: Not on file  Relationships  . Social connections:    Talks on phone: Not on file    Gets together: Not on file    Attends religious service: Not on file    Active member of club or organization: Not on file    Attends meetings of clubs or organizations: Not on file    Relationship status: Not on file  . Intimate partner violence:    Fear of current or ex partner: Not on file    Emotionally abused: Not on file    Physically abused: Not on file    Forced sexual activity: Not on file  Other Topics Concern  . Not on file  Social History Narrative  . Not on file    Current Outpatient Medications on File Prior to Visit  Medication Sig Dispense Refill  . diltiazem (CARDIZEM CD) 300 MG 24 hr capsule Take 1 capsule (300 mg total) by mouth daily. 90 capsule 3  . ELIQUIS 5 MG TABS tablet TK 1 T PO BID    . fluticasone-salmeterol (ADVAIR HFA) 115-21 MCG/ACT inhaler Inhale 2 puffs into the lungs 2 (two) times daily. 1 Inhaler 12  . ketoconazole (NIZORAL) 2 % cream APP AA ON SKIN BID    . omeprazole  (PRILOSEC) 20 MG capsule Take 1 capsule (20 mg total) by mouth daily. 30 capsule 6  . PROAIR HFA 108 (90 Base) MCG/ACT inhaler INL 2 PFS PO Q 4 TO 6 H PRN    . SPIRIVA HANDIHALER 18 MCG inhalation capsule USE 1 INHALATION VIA HANDIHALER DAILY    . apixaban (ELIQUIS) 5 MG TABS tablet Take 1 tablet (5 mg total) by mouth 2 (two) times daily for 30 days. 60 tablet 2   No current facility-administered medications on file prior to visit.     No Known Allergies     Observations/Objective: There were no vitals filed for this visit. There is no height or weight on file to calculate BMI.  Physical Exam  CBC    Component Value  Date/Time   WBC 7.8 07/01/2018 1336   RBC 5.61 07/01/2018 1336   HGB 17.0 07/01/2018 1336   HCT 49.4 07/01/2018 1336   PLT 153 07/01/2018 1336   MCV 88.1 07/01/2018 1336   MCH 30.3 07/01/2018 1336   MCHC 34.4 07/01/2018 1336   RDW 13.5 07/01/2018 1336   LYMPHSABS 2.4 07/01/2018 1336   MONOABS 0.7 07/01/2018 1336   EOSABS 0.2 07/01/2018 1336   BASOSABS 0.1 07/01/2018 1336    CMP     Component Value Date/Time   NA 136 07/01/2018 1336   NA 139 04/15/2018 1431   K 4.0 07/01/2018 1336   CL 105 07/01/2018 1336   CO2 25 07/01/2018 1336   GLUCOSE 96 07/01/2018 1336   BUN 15 07/01/2018 1336   BUN 9 04/15/2018 1431   CREATININE 1.22 07/01/2018 1336   CALCIUM 9.3 07/01/2018 1336   PROT 7.7 07/01/2018 1336   PROT 7.3 04/15/2018 1431   ALBUMIN 4.0 07/01/2018 1336   ALBUMIN 4.2 04/15/2018 1431   AST 23 07/01/2018 1336   ALT 19 07/01/2018 1336   ALKPHOS 56 07/01/2018 1336   BILITOT 1.0 07/01/2018 1336   BILITOT 0.7 04/15/2018 1431   GFRNONAA 60 (L) 07/01/2018 1336   GFRAA >60 07/01/2018 1336     Assessment and Plan: 1. Acute deep vein thrombosis (DVT) of popliteal vein of right lower extremity (HCC)   2. 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 Chronic anticoagulation   4. Positive occult stool blood test     Labs are  reviewed and discussed with patient.  Hemoglobin is stable.  Tolerating Elqiuis 5mg  BID, he has been on anticoagulation for 3 months and clinically lower extremity pain has improved.  He is pending plan for EGD for work up of positive FOBT and chronic stomach discomfort. Currently awaiting hem clearance.  I recommend patient to proceed with a repeat right lower extremity for evaluation of clot burden. If negative, then he can stop Eliquis 72 hours prior to EGD procedure and resume anticoagulation after procedure if hemostasis achieved.   # he needs long term anticoagulation, plan continue full strength Eliquis 5 mg twice daily for 6 months-, till late August 2020, and switched to Eliquis 2.5 mg BID as maintenance.   Follow Up Instructions: Follow-up in 3 months for labs and MD assessment.  Cc Dr.Tahiliani Varnita.  I discussed the assessment tah and treatment plan with the patient. The patient was provided an opportunity to ask questions and all were answered. The patient agreed with the plan and demonstrated an understanding of the instructions.  The patient was advised to call back or seek an in-person evaluation if the symptoms worsen or if the condition fails to improve as anticipated.   I provided 55minutes of face-to-face video visit time during this encounter, and > 50% was spent counseling as documented under my assessment & plan.  Earlie Server, MD 07/02/2018 11:26 PM

## 2018-07-03 ENCOUNTER — Ambulatory Visit (INDEPENDENT_AMBULATORY_CARE_PROVIDER_SITE_OTHER): Payer: Medicare Other | Admitting: Gastroenterology

## 2018-07-03 DIAGNOSIS — R1084 Generalized abdominal pain: Secondary | ICD-10-CM

## 2018-07-03 DIAGNOSIS — R195 Other fecal abnormalities: Secondary | ICD-10-CM

## 2018-07-03 NOTE — Progress Notes (Signed)
Zachary Antigua, MD [address]  [place]  [place], [place] [address]  Main: [phone]  Fax: [phone]   Primary Care Physician: [place]  Virtual Visit via Telephone Note  I connected with patient on 07/03/18 at 11:00 AM EDT by telephone and verified that I am speaking with the correct person using two identifiers.   I discussed the limitations, risks, security and privacy concerns of performing an evaluation and management service by telephone and the availability of in person appointments. I also discussed with the patient that there may be a patient responsible charge related to this service. The patient expressed understanding and agreed to proceed.  Location of Patient: Home Location of Provider: Home Persons involved: Patient and provider only   History of Present Illness: Chief Complaint  Patient presents with  . follow up positive fecal occult  . Abdominal Pain    when bending over, unbearable      HPI: Zachary Sweeney is a 73 y.o. male with history of chronic abdominal pain here for follow-up.  CT abdomen February 2020 did not show any acute abnormalities to lead to his pain.  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  Has an appointment with them on June 11.  They attempted to contact him earlier and make an appointment earlier, but he had refused an appointment at that time.  No altered bowel habits, no blood in stool.  No dysphagia.  Reports history of dysphagia 2 to 3 years ago and states that dysphagia resolved after his esophagus was stretched.  He was also referred previously due to positive occult blood test.  Colonoscopy had to wait due to acute DVT recently and Eliquis could not be stopped for at least 3 months as per hematology.  In addition, patient had a recent colonoscopy with below findings, thus making risk of malignancy low.  Patient  had a colonoscopy in January 2019 by Dr. Talmage Coin due to previous history of colon polyps.  As per the report: "Two 5 to 12 mm polyps in the cecum, removed with a hot snare. Resected and retrieved. - Two benign appearing 6 to 8 mm polyps in the rectum, removed with a cold snare. Resected and retrieved. - The distal rectum and anal verge are normal on retroflexion view."  Prep was noted to be adequate.  He also had an EGD at the same time for dysphagia.  An nonobstructing Schatzki's ring was reported and was biopsied.  Patient did not have any dilation during this procedure.  DIAGNOSIS:  A. GASTROESOPHAGEAL JUNCTION; COLD BIOPSY:  - SQUAMOUS MUCOSA WITH REACTIVE CHANGES, UP TO 15 INTRAEPITHELIAL  EOSINOPHILS IN ONE HIGH POWER FIELD, AND A RARE INTRAEPITHELIAL  NEUTROPHIL, SEE COMMENT.  - NEGATIVE FOR DYSPLASIA AND MALIGNANCY.   Comment:  Additional deeper sections were reviewed on part A. The histologic  features are compatible with reflux esophagitis, but not specific. If  response to proton pump inhibitors is unsatisfactory, or there is  clinical suspicion for eosinophilic esophagitis, then proximal  esophageal biopsies may be helpful.   B. COLON POLYP X 2, CECUM; COLD AND HOT SNARE:  - TWO TUBULAR ADENOMAS; LARGER IS AT LEAST 7 MM AND SMALLER IS AT LEAST  4 MM.  - NEGATIVE FOR HIGH-GRADE DYSPLASIA AND MALIGNANCY.   C. RECTUM POLYP X 2; COLD SNARE:  - TUBULAR ADENOMA, NEGATIVE FOR HIGH-GRADE DYSPLASIA AND MALIGNANCY.  - HYPERPLASTIC  POLYP, NEGATIVE FOR DYSPLASIA AND MALIGNANCY.   Current Outpatient Medications  Medication Sig Dispense Refill  . diltiazem (CARDIZEM CD) 300 MG 24 hr capsule Take 1 capsule (300 mg total) by mouth daily. 90 capsule 3  . ELIQUIS 5 MG TABS tablet TK 1 T PO BID    . fluticasone-salmeterol (ADVAIR HFA) 115-21 MCG/ACT inhaler Inhale 2 puffs into the lungs 2 (two) times daily. 1 Inhaler 12  . ketoconazole (NIZORAL) 2 % cream APP AA ON SKIN BID    .  omeprazole (PRILOSEC) 20 MG capsule Take 1 capsule (20 mg total) by mouth daily. 30 capsule 6  . PROAIR HFA 108 (90 Base) MCG/ACT inhaler INL 2 PFS PO Q 4 TO 6 H PRN    . SPIRIVA HANDIHALER 18 MCG inhalation capsule USE 1 INHALATION VIA HANDIHALER DAILY    . apixaban (ELIQUIS) 5 MG TABS tablet Take 1 tablet (5 mg total) by mouth 2 (two) times daily for 30 days. 60 tablet 2   No current facility-administered medications for this visit.     Allergies as of 07/03/2018  . (No Known Allergies)    Review of Systems:    All systems reviewed and negative except where noted in HPI.   Observations/Objective:  Labs: CMP     Component Value Date/Time   NA 136 07/01/2018 1336   NA 139 04/15/2018 1431   K 4.0 07/01/2018 1336   CL 105 07/01/2018 1336   CO2 25 07/01/2018 1336   GLUCOSE 96 07/01/2018 1336   BUN 15 07/01/2018 1336   BUN 9 04/15/2018 1431   CREATININE 1.22 07/01/2018 1336   CALCIUM 9.3 07/01/2018 1336   PROT 7.7 07/01/2018 1336   PROT 7.3 04/15/2018 1431   ALBUMIN 4.0 07/01/2018 1336   ALBUMIN 4.2 04/15/2018 1431   AST 23 07/01/2018 1336   ALT 19 07/01/2018 1336   ALKPHOS 56 07/01/2018 1336   BILITOT 1.0 07/01/2018 1336   BILITOT 0.7 04/15/2018 1431   GFRNONAA 60 (L) 07/01/2018 1336   GFRAA >60 07/01/2018 1336   Lab Results  Component Value Date   WBC 7.8 07/01/2018   HGB 17.0 07/01/2018   HCT 49.4 07/01/2018   MCV 88.1 07/01/2018   PLT 153 07/01/2018    Imaging Studies: No results found.  Assessment and Plan:   Zachary Sweeney is a 73 y.o. y/o male with chronic abdominal pain, and positive FOBT  Assessment and Plan: Patient is pending a leg ultrasound on May 21 which will be reviewed by Dr. Tasia Catchings and according to the findings, patient may be able to safely hold his Eliquis for his pending procedures.  Once clearance is obtained, patient can proceed with colonoscopy for positive FOBT, and EGD due to abdominal pain and previous history of eosinophils seen on  biopsies.  Although patient is asymptomatic from dysphagia perspective.    Continue to follow-up with surgery for abdominal wall hernia evaluation  I have discussed alternative options, risks & benefits,  which include, but are not limited to, bleeding, infection, perforation,respiratory complication & drug reaction.  The patient agrees with this plan & written consent will be obtained.     Follow Up Instructions: Follow-up in 3 months   I discussed the assessment and treatment plan with the patient. The patient was provided an opportunity to ask questions and all were answered. The patient agreed with the plan and demonstrated an understanding of the instructions.   The patient was advised to call back or seek an in-person  evaluation if the symptoms worsen or if the condition fails to improve as anticipated.  I provided 20 minutes of non-face-to-face time during this encounter.   Virgel Manifold, MD  Speech recognition software was used to dictate this note.

## 2018-07-07 ENCOUNTER — Telehealth: Payer: Self-pay

## 2018-07-07 ENCOUNTER — Other Ambulatory Visit: Payer: Self-pay

## 2018-07-07 DIAGNOSIS — R195 Other fecal abnormalities: Secondary | ICD-10-CM

## 2018-07-07 DIAGNOSIS — R1084 Generalized abdominal pain: Secondary | ICD-10-CM

## 2018-07-07 DIAGNOSIS — Z01812 Encounter for preprocedural laboratory examination: Secondary | ICD-10-CM

## 2018-07-07 NOTE — Telephone Encounter (Signed)
I spoke with pt and he was given the date for his colonoscopy and EGD (07/24/2018) and is aware that he will need to have his Covid testing on 07/21/2018 and that pre-admit testing will be contacting him. Pt also aware that he will need his clearance to hold Eliquis prior to his procedure as per Dr. Tasia Catchings, MD instructions (will let him know at a later date as to instructions). He has an ultrasound on 07/10/18 to check for clots per Dr Tasia Catchings, per pt. Also due to the mail running slow, pt will check his computer email for link to sign up for My Chart so I can send him the instructions for his prep and go over this with him tomorrow. His cell phone is outdated and not working properly.

## 2018-07-08 ENCOUNTER — Telehealth: Payer: Self-pay

## 2018-07-08 ENCOUNTER — Other Ambulatory Visit: Payer: Self-pay

## 2018-07-08 DIAGNOSIS — R1084 Generalized abdominal pain: Secondary | ICD-10-CM

## 2018-07-08 DIAGNOSIS — R195 Other fecal abnormalities: Secondary | ICD-10-CM

## 2018-07-08 MED ORDER — NA SULFATE-K SULFATE-MG SULF 17.5-3.13-1.6 GM/177ML PO SOLN: 1.0000 | Freq: Once | ORAL | 0 refills | Status: DC

## 2018-07-08 MED ORDER — NA SULFATE-K SULFATE-MG SULF 17.5-3.13-1.6 GM/177ML PO SOLN: 1.0000 | Freq: Once | ORAL | 0 refills | Status: AC

## 2018-07-08 NOTE — Telephone Encounter (Signed)
Went over prep instructions for colonoscopy and EGD. Will contact pt again when we receive his Eliquis clearance.

## 2018-07-08 NOTE — Telephone Encounter (Signed)
Pt is signing up for my chart this am and will call him back and go over his instructions for colonoscopy.

## 2018-07-09 ENCOUNTER — Ambulatory Visit: Payer: Medicare Other | Admitting: Gastroenterology

## 2018-07-10 ENCOUNTER — Ambulatory Visit
Admission: RE | Admit: 2018-07-10 | Discharge: 2018-07-10 | Disposition: A | Discharge status: Home / Self Care | Payer: Medicare Other | Source: Ambulatory Visit | Attending: Oncology | Admitting: Oncology

## 2018-07-10 ENCOUNTER — Other Ambulatory Visit: Payer: Self-pay

## 2018-07-10 DIAGNOSIS — I82431 Acute embolism and thrombosis of right popliteal vein: Secondary | ICD-10-CM | POA: Diagnosis not present

## 2018-07-18 ENCOUNTER — Other Ambulatory Visit: Admission: RE | Admit: 2018-07-18 | Payer: Medicare Other | Source: Ambulatory Visit

## 2018-07-21 ENCOUNTER — Other Ambulatory Visit: Payer: Self-pay

## 2018-07-21 ENCOUNTER — Encounter: Admission: RE | Admit: 2018-07-21 | Payer: Medicare Other | Source: Ambulatory Visit

## 2018-07-22 ENCOUNTER — Other Ambulatory Visit: Admission: RE | Admit: 2018-07-22 | Payer: Medicare Other | Source: Ambulatory Visit

## 2018-07-22 ENCOUNTER — Telehealth: Payer: Self-pay

## 2018-07-22 NOTE — Telephone Encounter (Signed)
I spoke with the pharmacist and they calculated that the patient's creatinine clearance is 103 mL/min.  The pharmacist stated, for Eliquis, they use a total creatinine clearance and it would be 103.  And based on this, it is okay to hold Eliquis for 2 days prior to the procedure instead of 3.    As per MD Calc Cockcroft-Gault equation original creatinine clearance is 103.  Adjusted for overweight is 80 mL/min.  Adjusted for ideal body weight is 64.6 to 79.9 mL/min.  Therefore, creatinine clearance is above 60 in all cases.  According to ASGE guidelines for antithrombotics in patients undergoing upper endoscopy, Eliquis can be held for 2 days in patients with creatinine clearance of over 60.  Therefore, can proceed with colonoscopy as scheduled as patient will have held it for 2 days by that point.  Dr. Tasia Catchings has cleared the patient for holding Eliquis as well based on her last clinic note, his lower extremity ultrasound, and her epic message to [place] in this regard.

## 2018-07-22 NOTE — Telephone Encounter (Signed)
Pt stated that no one told him not to take his eliquis. After notes from Dr. Collie Siad office pt. May stop Eliquis 72hrs prior to procedure. I asked that pt hold off on taking Eliquis until I speak with Dr. Earlie Counts since it will be less than 72hr. Dr. Bonna Gains states if pt stops today it would be okay and he gets his Covid 19 testing done. Pt notified not to take Eliquis until after procedure, Dr. Bonna Gains will let him know and he has had his Covid testing today.

## 2018-07-23 ENCOUNTER — Telehealth: Payer: Self-pay | Admitting: Gastroenterology

## 2018-07-23 ENCOUNTER — Other Ambulatory Visit: Payer: Self-pay

## 2018-07-23 ENCOUNTER — Telehealth: Payer: Self-pay

## 2018-07-23 ENCOUNTER — Other Ambulatory Visit
Admission: RE | Admit: 2018-07-23 | Discharge: 2018-07-23 | Disposition: A | Discharge status: Home / Self Care | Payer: Medicare Other | Source: Ambulatory Visit | Attending: Gastroenterology | Admitting: Gastroenterology

## 2018-07-23 ENCOUNTER — Encounter: Payer: Self-pay | Admitting: Anesthesiology

## 2018-07-23 DIAGNOSIS — Z1159 Encounter for screening for other viral diseases: Secondary | ICD-10-CM | POA: Diagnosis present

## 2018-07-23 LAB — SARS CORONAVIRUS 2 BY RT PCR (HOSPITAL ORDER, PERFORMED IN ~~LOC~~ HOSPITAL LAB): SARS Coronavirus 2: NEGATIVE

## 2018-07-23 NOTE — Telephone Encounter (Signed)
Pt advised what time he needed to start taking his bowel prep today. Pt had no further questions.

## 2018-07-23 NOTE — Telephone Encounter (Signed)
Patient states that he is going this am to have his COVID19 test.  He said someone called him.  Thanks [employer]

## 2018-07-23 NOTE — Telephone Encounter (Signed)
Pt left vm wanting to know how to take the prep and what time his procedure was for tomorrow, left him a vm with information

## 2018-07-24 ENCOUNTER — Ambulatory Visit: Payer: Medicare Other | Admitting: Anesthesiology

## 2018-07-24 ENCOUNTER — Other Ambulatory Visit: Payer: Self-pay

## 2018-07-24 ENCOUNTER — Encounter
Admission: RE | Disposition: A | Discharge status: Home / Self Care | Payer: Self-pay | Source: Home / Self Care | Attending: Gastroenterology

## 2018-07-24 ENCOUNTER — Encounter: Payer: Self-pay | Admitting: *Deleted

## 2018-07-24 ENCOUNTER — Ambulatory Visit
Admission: RE | Admit: 2018-07-24 | Discharge: 2018-07-24 | Disposition: A | Discharge status: Home / Self Care | Payer: Medicare Other | Attending: Gastroenterology | Admitting: Gastroenterology

## 2018-07-24 ENCOUNTER — Telehealth: Payer: Self-pay

## 2018-07-24 DIAGNOSIS — L83 Acanthosis nigricans: Secondary | ICD-10-CM | POA: Diagnosis not present

## 2018-07-24 DIAGNOSIS — Z86010 Personal history of colonic polyps: Secondary | ICD-10-CM | POA: Insufficient documentation

## 2018-07-24 DIAGNOSIS — D122 Benign neoplasm of ascending colon: Secondary | ICD-10-CM | POA: Diagnosis not present

## 2018-07-24 DIAGNOSIS — K635 Polyp of colon: Secondary | ICD-10-CM | POA: Insufficient documentation

## 2018-07-24 DIAGNOSIS — D124 Benign neoplasm of descending colon: Secondary | ICD-10-CM | POA: Diagnosis not present

## 2018-07-24 DIAGNOSIS — Z8042 Family history of malignant neoplasm of prostate: Secondary | ICD-10-CM | POA: Diagnosis not present

## 2018-07-24 DIAGNOSIS — R1310 Dysphagia, unspecified: Secondary | ICD-10-CM | POA: Diagnosis not present

## 2018-07-24 DIAGNOSIS — Z86718 Personal history of other venous thrombosis and embolism: Secondary | ICD-10-CM | POA: Insufficient documentation

## 2018-07-24 DIAGNOSIS — K210 Gastro-esophageal reflux disease with esophagitis: Secondary | ICD-10-CM | POA: Insufficient documentation

## 2018-07-24 DIAGNOSIS — K228 Other specified diseases of esophagus: Secondary | ICD-10-CM | POA: Diagnosis not present

## 2018-07-24 DIAGNOSIS — Z7951 Long term (current) use of inhaled steroids: Secondary | ICD-10-CM | POA: Insufficient documentation

## 2018-07-24 DIAGNOSIS — Z801 Family history of malignant neoplasm of trachea, bronchus and lung: Secondary | ICD-10-CM | POA: Insufficient documentation

## 2018-07-24 DIAGNOSIS — Z79899 Other long term (current) drug therapy: Secondary | ICD-10-CM | POA: Insufficient documentation

## 2018-07-24 DIAGNOSIS — K5730 Diverticulosis of large intestine without perforation or abscess without bleeding: Secondary | ICD-10-CM | POA: Diagnosis not present

## 2018-07-24 DIAGNOSIS — K449 Diaphragmatic hernia without obstruction or gangrene: Secondary | ICD-10-CM | POA: Diagnosis not present

## 2018-07-24 DIAGNOSIS — J439 Emphysema, unspecified: Secondary | ICD-10-CM | POA: Diagnosis not present

## 2018-07-24 DIAGNOSIS — Z8249 Family history of ischemic heart disease and other diseases of the circulatory system: Secondary | ICD-10-CM | POA: Insufficient documentation

## 2018-07-24 DIAGNOSIS — Z87891 Personal history of nicotine dependence: Secondary | ICD-10-CM | POA: Insufficient documentation

## 2018-07-24 DIAGNOSIS — D123 Benign neoplasm of transverse colon: Secondary | ICD-10-CM | POA: Diagnosis not present

## 2018-07-24 DIAGNOSIS — K2289 Other specified disease of esophagus: Secondary | ICD-10-CM

## 2018-07-24 DIAGNOSIS — D120 Benign neoplasm of cecum: Secondary | ICD-10-CM | POA: Diagnosis not present

## 2018-07-24 DIAGNOSIS — Z7901 Long term (current) use of anticoagulants: Secondary | ICD-10-CM | POA: Insufficient documentation

## 2018-07-24 DIAGNOSIS — D125 Benign neoplasm of sigmoid colon: Secondary | ICD-10-CM | POA: Diagnosis not present

## 2018-07-24 DIAGNOSIS — I10 Essential (primary) hypertension: Secondary | ICD-10-CM | POA: Insufficient documentation

## 2018-07-24 DIAGNOSIS — K921 Melena: Secondary | ICD-10-CM | POA: Insufficient documentation

## 2018-07-24 DIAGNOSIS — R195 Other fecal abnormalities: Secondary | ICD-10-CM | POA: Diagnosis not present

## 2018-07-24 DIAGNOSIS — R1319 Other dysphagia: Secondary | ICD-10-CM

## 2018-07-24 DIAGNOSIS — R1084 Generalized abdominal pain: Secondary | ICD-10-CM

## 2018-07-24 HISTORY — PX: ESOPHAGOGASTRODUODENOSCOPY (EGD) WITH PROPOFOL: SHX5813

## 2018-07-24 HISTORY — PX: COLONOSCOPY WITH PROPOFOL: SHX5780

## 2018-07-24 SURGERY — COLONOSCOPY WITH PROPOFOL
Anesthesia: General

## 2018-07-24 MED ORDER — PROPOFOL 500 MG/50ML IV EMUL
INTRAVENOUS | Status: DC | PRN
  Administered 2018-07-24: 120 ug/kg/min via INTRAVENOUS

## 2018-07-24 MED ORDER — FENTANYL CITRATE (PF) 100 MCG/2ML IJ SOLN
INTRAMUSCULAR | Status: AC
  Filled 2018-07-24: qty 2

## 2018-07-24 MED ORDER — SODIUM CHLORIDE 0.9 % IV SOLN
INTRAVENOUS | Status: DC
  Administered 2018-07-24: 10:00:00 1000 mL via INTRAVENOUS

## 2018-07-24 MED ORDER — PROPOFOL 10 MG/ML IV BOLUS
INTRAVENOUS | Status: AC
  Filled 2018-07-24: qty 20

## 2018-07-24 MED ORDER — LIDOCAINE HCL (PF) 2 % IJ SOLN
INTRAMUSCULAR | Status: AC
  Filled 2018-07-24: qty 10

## 2018-07-24 MED ORDER — MIDAZOLAM HCL 2 MG/2ML IJ SOLN
INTRAMUSCULAR | Status: AC
  Filled 2018-07-24: qty 2

## 2018-07-24 MED ORDER — PROPOFOL 500 MG/50ML IV EMUL
INTRAVENOUS | Status: AC
  Filled 2018-07-24: qty 50

## 2018-07-24 MED ORDER — MIDAZOLAM HCL 2 MG/2ML IJ SOLN
INTRAMUSCULAR | Status: DC | PRN
  Administered 2018-07-24: 2 mg via INTRAVENOUS

## 2018-07-24 MED ORDER — FENTANYL CITRATE (PF) 100 MCG/2ML IJ SOLN
INTRAMUSCULAR | Status: DC | PRN
  Administered 2018-07-24 (×2): 50 ug via INTRAVENOUS

## 2018-07-24 NOTE — Anesthesia Postprocedure Evaluation (Signed)
Anesthesia Post Note  Patient: Zachary Sweeney  Procedure(s) Performed: COLONOSCOPY WITH PROPOFOL (N/A ) ESOPHAGOGASTRODUODENOSCOPY (EGD) WITH PROPOFOL (N/A )  Patient location during evaluation: Endoscopy Anesthesia Type: General Level of consciousness: awake and alert Pain management: pain level controlled Vital Signs Assessment: post-procedure vital signs reviewed and stable Respiratory status: spontaneous breathing, nonlabored ventilation, respiratory function stable and patient connected to nasal cannula oxygen Cardiovascular status: blood pressure returned to baseline and stable Postop Assessment: no apparent nausea or vomiting Anesthetic complications: no     Last Vitals:  Vitals:   07/24/18 1150 07/24/18 1200  BP: 134/88 108/88  Pulse: 71   Resp: 16   Temp:    SpO2: 99%     Last Pain:  Vitals:   07/24/18 1140  TempSrc: Tympanic  PainSc:                  Sheylin Scharnhorst S

## 2018-07-24 NOTE — H&P (Signed)
Vonda Antigua, MD [address] [place], [place], [place], [address] [address], [place], [place], [place], [address] Phone: [phone]  Fax: [phone]  Primary Care Physician:  [place]   Pre-Procedure History & Physical: HPI:  Zachary Sweeney is a 73 y.o. male is here for a colonoscopy and EGD.   Past Medical History:  Diagnosis Date  . Acid reflux   . Back injury   . Colon polyp   . DVT (deep venous thrombosis) ([place])    a. 06/2017 LE U/S: Right popliteal vein DVT.  Marland Kitchen Dyspnea on exertion    a. 06/2017 CTA Chest: No PE. No significant coronary Ca2+, centrilobular and paraseptal emphysema;  b.  07/2017 Echo: EF 60-65%, no rwma, Gr1 DD, mildly dil LA. Nl RV fxn; c. 07/2017 MV: Small, severe defect involving apical inf and apical segments - only on rest images consistent w/ artifact. No ischemia or scar.  . Emphysema of lung ([place])    a. 06/2017 CTA Chest: Centrilobular and paraseptal emphysema, with mild geographic ground-glass opacity likely representing small airway dzs.  Marland Kitchen Hypertension   . Torn ligament     Past Surgical History:  Procedure Laterality Date  . COLONOSCOPY  2008  . COLONOSCOPY WITH PROPOFOL N/A 02/27/2017   Procedure: COLONOSCOPY WITH PROPOFOL;  Surgeon: Robert Bellow, MD;  Location: [facility] ENDOSCOPY;  Service: Endoscopy;  Laterality: N/A;  . ESOPHAGOGASTRODUODENOSCOPY (EGD) WITH PROPOFOL N/A 02/27/2017   Procedure: ESOPHAGOGASTRODUODENOSCOPY (EGD) WITH PROPOFOL;  Surgeon: Robert Bellow, MD;  Location: [facility] ENDOSCOPY;  Service: Endoscopy;  Laterality: N/A;  . HERNIA REPAIR    . KNEE SURGERY Left   . NECK SURGERY    . ROTATOR CUFF REPAIR Right     Prior to Admission medications   Medication Sig Start Date End Date Taking? Authorizing Provider  diltiazem (CARDIZEM CD) 300 MG 24 hr capsule Take 1 capsule (300 mg total) by mouth daily. 09/04/17  Yes Theora Gianotti, NP  ELIQUIS 5 MG TABS tablet TK 1 T PO BID 05/23/18   Yes [provider]  fluticasone-salmeterol (ADVAIR HFA) 115-21 MCG/ACT inhaler Inhale 2 puffs into the lungs 2 (two) times daily. 09/06/17  Yes Flora Lipps, MD  ketoconazole (NIZORAL) 2 % cream APP AA ON SKIN BID 12/04/17  Yes [provider]  omeprazole (PRILOSEC) 20 MG capsule Take 1 capsule (20 mg total) by mouth daily. 09/06/17 09/06/18 Yes Kasa, Maretta Bees, MD  SPIRIVA HANDIHALER 18 MCG inhalation capsule USE 1 INHALATION VIA HANDIHALER DAILY 04/08/18  Yes [provider]  apixaban (ELIQUIS) 5 MG TABS tablet Take 1 tablet (5 mg total) by mouth 2 (two) times daily for 30 days. 03/30/18 04/29/18  Harvest Dark, MD  PROAIR HFA [phone] Base) MCG/ACT inhaler INL 2 PFS PO Q 4 TO 6 H PRN 04/03/18   [provider]    Allergies as of 07/07/2018  . (No Known Allergies)    Family History  Problem Relation Age of Onset  . Lung cancer Mother   . Heart disease Father   . Prostate cancer Maternal Uncle     Social History   Socioeconomic History  . Marital status: Single    Spouse name: Not on file  . Number of children: Not on file  . Years of education: Not on file  . Highest education level: Not on file  Occupational History  . Occupation: retired  Scientific laboratory technician  . Financial resource strain: Not on file  . Food insecurity:  Worry: Not on file    Inability: Not on file  . Transportation needs:    Medical: Not on file    Non-medical: Not on file  Tobacco Use  . Smoking status: Former Smoker    Packs/day: 0.25    Years: 30.00    Pack years: 7.50    Types: Cigarettes    Last attempt to quit: 02/19/1994    Years since quitting: 24.4  . Smokeless tobacco: Never Used  Substance and Sexual Activity  . Alcohol use: No    Frequency: Never  . Drug use: No  . Sexual activity: Not on file  Lifestyle  . Physical activity:    Days per week: Not on file    Minutes per session: Not on file  . Stress: Not on file  Relationships  . Social connections:     Talks on phone: Not on file    Gets together: Not on file    Attends religious service: Not on file    Active member of club or organization: Not on file    Attends meetings of clubs or organizations: Not on file    Relationship status: Not on file  . Intimate partner violence:    Fear of current or ex partner: Not on file    Emotionally abused: Not on file    Physically abused: Not on file    Forced sexual activity: Not on file  Other Topics Concern  . Not on file  Social History Narrative  . Not on file    Review of Systems: See HPI, otherwise negative ROS  Physical Exam: BP 140/90   Pulse 69   Temp (!) 96.6 F (35.9 C) (Tympanic)   Resp 17   Ht 6\' 1"  (1.854 m)   Wt 131.5 kg   SpO2 99%   BMI 38.26 kg/m  General:   Alert,  pleasant and cooperative in NAD Head:  Normocephalic and atraumatic. Neck:  Supple; no masses or thyromegaly. Lungs:  Clear throughout to auscultation, normal respiratory effort.    Heart:  +S1, +S2, Regular rate and rhythm, No edema. Abdomen:  Soft, nontender and nondistended. Normal bowel sounds, without guarding, and without rebound.   Neurologic:  Alert and  oriented x4;  grossly normal neurologically.  Impression/Plan: Zachary Sweeney is here for a colonoscopy to be performed for positive FOBT and EGD for dysphagia.  Risks, benefits, limitations, and alternatives regarding the procedures have been reviewed with the patient.  Questions have been answered.  All parties agreeable.   Virgel Manifold, MD  07/24/2018, 10:36 AM

## 2018-07-24 NOTE — Op Note (Signed)
[facility] Gastroenterology Patient Name: Zachary Sweeney Procedure Date: 07/24/2018 10:36 AM MRN: [id number] Account #: [id number] Date of Birth: 04-08-1945 Admit Type: Outpatient Age: 73 Room: [facility] ENDO ROOM 4 Gender: Male Note Status: Finalized Procedure:            Colonoscopy Indications:          Gastrointestinal occult blood loss Providers:            Varnita B. Bonna Gains MD, MD Referring MD:         Forest Gleason Md, MD (Referring MD) Medicines:            Monitored Anesthesia Care Complications:        No immediate complications. Procedure:            Pre-Anesthesia Assessment:                       - ASA Grade Assessment: II - A patient with mild                        systemic disease.                       - Prior to the procedure, a History and Physical was                        performed, and patient medications, allergies and                        sensitivities were reviewed. The patient's tolerance of                        previous anesthesia was reviewed.                       - The risks and benefits of the procedure and the                        sedation options and risks were discussed with the                        patient. All questions were answered and informed                        consent was obtained.                       - Patient identification and proposed procedure were                        verified prior to the procedure by the physician, the                        nurse, the anesthesiologist, the anesthetist and the                        technician. The procedure was verified in the procedure                        room.  After obtaining informed consent, the colonoscope was                        passed under direct vision. Throughout the procedure,                        the patient's blood pressure, pulse, and oxygen                        saturations were monitored continuously. The       Colonoscope was introduced through the anus and                        advanced to the the cecum, identified by appendiceal                        orifice and ileocecal valve. The colonoscopy was                        performed with ease. The patient tolerated the                        procedure well. The quality of the bowel preparation                        was good. Findings:      The perianal and digital rectal examinations were normal.      Eight sessile polyps were found in the sigmoid colon, descending colon,       transverse colon, ascending colon and cecum. The polyps were 4 to 6 mm       in size. These polyps were removed with a cold snare. Resection and       retrieval were complete.      Three sessile polyps were found in the ascending colon and cecum. The       polyps were 2 to 4 mm in size. These polyps were removed with a cold       biopsy forceps. Resection and retrieval were complete.      A single diverticulum was found in the colon.      The exam was otherwise without abnormality.      The rectum, sigmoid colon, descending colon, transverse colon, ascending       colon and cecum appeared normal.      The retroflexed view of the distal rectum and anal verge was normal and       showed no anal or rectal abnormalities.      At the beginning of the procedure the entire network system at [facility]       malfunctioned and [employer] was not able to capture or save any images.       Scope insertion, and withdrawal times were documented manually. The       system started working toward the end of the procedure and only a few       images could be captured due to this. Impression:           - Eight 4 to 6 mm polyps in the sigmoid colon, in the                        descending colon, in the transverse colon, in the  ascending colon and in the cecum, removed with a cold                        snare. Resected and retrieved.                       - Three 2  to 4 mm polyps in the ascending colon and in                        the cecum, removed with a cold biopsy forceps. Resected                        and retrieved.                       - Diverticulosis.                       - The examination was otherwise normal.                       - The rectum, sigmoid colon, descending colon,                        transverse colon, ascending colon and cecum are normal.                       - The distal rectum and anal verge are normal on                        retroflexion view. Recommendation:       - Discharge patient to home (with escort).                       - High fiber diet.                       - Advance diet as tolerated.                       - Resume Eliquis (apixaban) at prior dose today. Refer                        to referring physician for further adjustment of                        therapy.                       - Continue present medications.                       - Await pathology results.                       - Repeat colonoscopy in 3 years.                       - The findings and recommendations were discussed with                        the patient.                       -  The findings and recommendations were discussed with                        the patient's family.                       - Return to primary care physician as previously                        scheduled. Procedure Code(s):    --- Professional ---                       [phone], Colonoscopy, flexible; with removal of tumor(s),                        polyp(s), or other lesion(s) by snare technique                       45380, 60, Colonoscopy, flexible; with biopsy, single                        or multiple Diagnosis Code(s):    --- Professional ---                       K63.5, Polyp of colon                       R19.5, Other fecal abnormalities                       K57.30, Diverticulosis of large intestine without                        perforation or  abscess without bleeding CPT copyright 2019 American Medical Association. All rights reserved. The codes documented in this report are preliminary and upon coder review may  be revised to meet current compliance requirements.  Vonda Antigua, MD Margretta Sidle B. Bonna Gains MD, MD 07/24/2018 12:08:05 PM This report has been signed electronically. Number of Addenda: 0 Note Initiated On: 07/24/2018 10:36 AM Scope Withdrawal Time: 0 hours 29 minutes 37 seconds  Total Procedure Duration: 0 hours 51 minutes 51 seconds  Estimated Blood Loss: Estimated blood loss: none.      [facility]

## 2018-07-24 NOTE — Telephone Encounter (Signed)
Blood thinner information request faxed to Dr. Michele Mcalpine nurse, Jackelyn Poling, @ [facility]. Fax confimation received.

## 2018-07-24 NOTE — Op Note (Signed)
[facility] Patient Name: Zachary Sweeney Procedure Date: 07/24/2018 10:37 AM MRN: [id number] Account #: [id number] Date of Birth: 1945/06/09 Admit Type: Outpatient Age: 73 Room: [facility] ENDO ROOM 4 Gender: Male Note Status: Finalized Procedure:            Upper GI endoscopy Indications:          Dysphagia Providers:            Varnita B. Bonna Gains MD, MD Referring MD:         Forest Gleason Md, MD (Referring MD) Medicines:            Monitored Anesthesia Care Complications:        No immediate complications. Procedure:            Pre-Anesthesia Assessment:                       - Prior to the procedure, a History and Physical was                        performed, and patient medications, allergies and                        sensitivities were reviewed. The patient's tolerance of                        previous anesthesia was reviewed.                       - The risks and benefits of the procedure and the                        sedation options and risks were discussed with the                        patient. All questions were answered and informed                        consent was obtained.                       - Patient identification and proposed procedure were                        verified prior to the procedure by the physician, the                        nurse, the anesthesiologist, the anesthetist and the                        technician. The procedure was verified in the procedure                        room.                       - ASA Grade Assessment: II - A patient with mild                        systemic disease.                       After  obtaining informed consent, the endoscope was                        passed under direct vision. Throughout the procedure,                        the patient's blood pressure, pulse, and oxygen                        saturations were monitored continuously. The Endoscope                        was  introduced through the mouth, and advanced to the                        second part of duodenum. The upper GI endoscopy was                        accomplished with ease. The patient tolerated the                        procedure well. Findings:      One tongue of salmon-colored mucosa was present. No other visible       abnormalities were present. The maximum longitudinal extent of these       esophageal mucosal changes was 0.5 cm in length. Mucosa was biopsied       with a cold forceps for histology in a targeted manner and in 4       quadrants at the gastroesophageal junction.      There is no endoscopic evidence of stricture in the entire esophagus.       Biopsies were obtained from the proximal and distal esophagus with cold       forceps for histology of suspected eosinophilic esophagitis.      A small hiatal hernia was present.      The entire examined stomach was normal.      The duodenal bulb, second portion of the duodenum and examined duodenum       were normal. Impression:           - Salmon-colored mucosa suggestive of Barrett's                        esophagus. Biopsied.                       - Small hiatal hernia.                       - Normal stomach.                       - Normal duodenal bulb, second portion of the duodenum                        and examined duodenum. Recommendation:       - Await pathology results.                       - Discharge patient to home (with escort).                       - Advance diet  as tolerated.                       - Continue present medications.                       - Patient has a contact number available for                        emergencies. The signs and symptoms of potential                        delayed complications were discussed with the patient.                        Return to normal activities tomorrow. Written discharge                        instructions were provided to the patient.                       -  Discharge patient to home (with escort).                       - The findings and recommendations were discussed with                        the patient.                       - The findings and recommendations were discussed with                        the patient's family. Procedure Code(s):    --- Professional ---                       [phone], Esophagogastroduodenoscopy, flexible, transoral;                        with biopsy, single or multiple Diagnosis Code(s):    --- Professional ---                       K22.8, Other specified diseases of esophagus                       K44.9, Diaphragmatic hernia without obstruction or                        gangrene                       R13.10, Dysphagia, unspecified CPT copyright 2019 American Medical Association. All rights reserved. The codes documented in this report are preliminary and upon coder review may  be revised to meet current compliance requirements.  Vonda Antigua, MD Margretta Sidle B. Bonna Gains MD, MD 07/24/2018 11:59:46 AM This report has been signed electronically. Number of Addenda: 0 Note Initiated On: 07/24/2018 10:37 AM Estimated Blood Loss: Estimated blood loss: none.      [facility]

## 2018-07-24 NOTE — Anesthesia Preprocedure Evaluation (Addendum)
Anesthesia Evaluation  Patient identified by MRN, date of birth, ID band Patient awake    Reviewed: Allergy & Precautions, NPO status , Patient's Chart, lab work & pertinent test results, reviewed documented beta blocker date and time   Airway Mallampati: III  TM Distance: >3 FB     Dental  (+) Chipped, Upper Dentures   Pulmonary COPD, former smoker,           Cardiovascular hypertension, Pt. on medications + DOE       Neuro/Psych    GI/Hepatic GERD  ,  Endo/Other    Renal/GU      Musculoskeletal   Abdominal   Peds  Hematology   Anesthesia Other Findings EKG and ECHO reviewed and OK. Poor R wave progression seen on EKG. But no symptoms.  Reproductive/Obstetrics                            Anesthesia Physical Anesthesia Plan  ASA: III  Anesthesia Plan: General   Post-op Pain Management:    Induction: Intravenous  PONV Risk Score and Plan:   Airway Management Planned:   Additional Equipment:   Intra-op Plan:   Post-operative Plan:   Informed Consent: I have reviewed the patients History and Physical, chart, labs and discussed the procedure including the risks, benefits and alternatives for the proposed anesthesia with the patient or authorized representative who has indicated his/her understanding and acceptance.       Plan Discussed with: CRNA  Anesthesia Plan Comments:         Anesthesia Quick Evaluation

## 2018-07-24 NOTE — Anesthesia Procedure Notes (Signed)
Performed by: Vaughan Sine Pre-anesthesia Checklist: Patient identified, Emergency Drugs available, Suction available, Patient being monitored and Timeout performed Patient Re-evaluated:Patient Re-evaluated prior to induction Oxygen Delivery Method: Simple face mask Preoxygenation: Pre-oxygenation with 100% oxygen Induction Type: IV induction Ventilation: Oral airway inserted - appropriate to patient size Airway Equipment and Method: Bite block Placement Confirmation: positive ETCO2 and CO2 detector

## 2018-07-24 NOTE — Transfer of Care (Signed)
Immediate Anesthesia Transfer of Care Note  Patient: Zachary Sweeney  Procedure(s) Performed: COLONOSCOPY WITH PROPOFOL (N/A ) ESOPHAGOGASTRODUODENOSCOPY (EGD) WITH PROPOFOL (N/A )  Patient Location: PACU  Anesthesia Type:General  Level of Consciousness: awake and sedated  Airway & Oxygen Therapy: Patient Spontanous Breathing and Patient connected to nasal cannula oxygen  Post-op Assessment: Report given to RN and Post -op Vital signs reviewed and stable  Post vital signs: Reviewed and stable  Last Vitals:  Vitals Value Taken Time  BP 118/75 07/24/2018 11:45 AM  Temp 36.1 C 07/24/2018 11:40 AM  Pulse 73 07/24/2018 11:48 AM  Resp 16 07/24/2018 11:48 AM  SpO2 100 % 07/24/2018 11:48 AM  Vitals shown include unvalidated device data.  Last Pain:  Vitals:   07/24/18 1140  TempSrc: Tympanic  PainSc:       Patients Stated Pain Goal: 0 (82/[id number] 1308)  Complications: No apparent anesthesia complications

## 2018-07-24 NOTE — Anesthesia Post-op Follow-up Note (Signed)
Anesthesia QCDR form completed.        

## 2018-07-25 ENCOUNTER — Encounter: Payer: Self-pay | Admitting: Gastroenterology

## 2018-07-25 NOTE — Progress Notes (Signed)
PT. Had severe abd. Pain yesterday he did not call md he is ok today but I told him to let MD. Know.

## 2018-07-28 ENCOUNTER — Other Ambulatory Visit: Payer: Self-pay | Admitting: Gastroenterology

## 2018-07-28 LAB — SURGICAL PATHOLOGY

## 2018-07-28 MED ORDER — FAMOTIDINE 20 MG PO TABS: 20.0000 mg | ORAL_TABLET | Freq: Every day | ORAL | 1 refills | Status: DC

## 2018-07-29 ENCOUNTER — Telehealth: Payer: Self-pay

## 2018-07-29 MED ORDER — FAMOTIDINE 20 MG PO TABS: 20.0000 mg | ORAL_TABLET | Freq: Every day | ORAL | 1 refills | Status: DC

## 2018-07-29 NOTE — Telephone Encounter (Signed)
Pt notified of results but does want his medication sent to [facility] on [address]. Will send recall letter for repeat colonoscopy in 3 yrs and have front desk contact pt for tele-med visit in 2 weeks to discuss the details.

## 2018-07-29 NOTE — Telephone Encounter (Signed)
-----   Message from Virgel Manifold, MD sent at 07/28/2018 10:36 AM EDT ----- Jackelyn Poling please let patient know, his biopsies showed evidence of reflux.  I have sent Pepcid over to his pharmacy.  The polyps seen in his colon were benign but precancerous.  Repeat colonoscopy in 3 years.  Please set up tele-med appointment with me in 2 weeks to discuss these results in detail.

## 2018-07-31 ENCOUNTER — Ambulatory Visit (INDEPENDENT_AMBULATORY_CARE_PROVIDER_SITE_OTHER): Payer: Medicare Other | Admitting: General Surgery

## 2018-07-31 ENCOUNTER — Encounter: Payer: Self-pay | Admitting: General Surgery

## 2018-07-31 ENCOUNTER — Other Ambulatory Visit: Payer: Self-pay

## 2018-07-31 VITALS — BP 139/83 | HR 70 | Temp 97.2°F | Resp 18 | Ht 74.0 in | Wt 299.8 lb

## 2018-07-31 DIAGNOSIS — R1013 Epigastric pain: Secondary | ICD-10-CM

## 2018-07-31 NOTE — Progress Notes (Signed)
Patient ID: Zachary Sweeney, male   DOB: 07-08-1945, 73 y.o.   MRN: [id number]  Chief Complaint  Patient presents with  . Follow-up    abdominal wall hernia    HPI Zachary Sweeney is a 73 y.o. male.  Here today for abdominal wall hernia. Patient states he has pain and discomfort for the past year. No nausea or vomiting. Has bowel movements 6-8 times daily. Colonoscopy 07/24/2018, benign polyp was removed.  HPI  Past Medical History:  Diagnosis Date  . Acid reflux   . Back injury   . Colon polyp   . DVT (deep venous thrombosis) ([place])    a. 06/2017 LE U/S: Right popliteal vein DVT.  Marland Kitchen Dyspnea on exertion    a. 06/2017 CTA Chest: No PE. No significant coronary Ca2+, centrilobular and paraseptal emphysema;  b.  07/2017 Echo: EF 60-65%, no rwma, Gr1 DD, mildly dil LA. Nl RV fxn; c. 07/2017 MV: Small, severe defect involving apical inf and apical segments - only on rest images consistent w/ artifact. No ischemia or scar.  . Emphysema of lung ([place])    a. 06/2017 CTA Chest: Centrilobular and paraseptal emphysema, with mild geographic ground-glass opacity likely representing small airway dzs.  Marland Kitchen Hypertension   . Torn ligament     Past Surgical History:  Procedure Laterality Date  . COLONOSCOPY  2008  . COLONOSCOPY WITH PROPOFOL N/A 02/27/2017   Procedure: COLONOSCOPY WITH PROPOFOL;  Surgeon: Robert Bellow, MD;  Location: [facility] ENDOSCOPY;  Service: Endoscopy;  Laterality: N/A;  . COLONOSCOPY WITH PROPOFOL N/A 07/24/2018   Procedure: COLONOSCOPY WITH PROPOFOL;  Surgeon: Virgel Manifold, MD;  Location: [facility] ENDOSCOPY;  Service: Endoscopy;  Laterality: N/A;  . ESOPHAGOGASTRODUODENOSCOPY (EGD) WITH PROPOFOL N/A 02/27/2017   Procedure: ESOPHAGOGASTRODUODENOSCOPY (EGD) WITH PROPOFOL;  Surgeon: Robert Bellow, MD;  Location: [facility] ENDOSCOPY;  Service: Endoscopy;  Laterality: N/A;  . ESOPHAGOGASTRODUODENOSCOPY (EGD) WITH PROPOFOL N/A 07/24/2018   Procedure: ESOPHAGOGASTRODUODENOSCOPY (EGD) WITH  PROPOFOL;  Surgeon: Virgel Manifold, MD;  Location: [facility] ENDOSCOPY;  Service: Endoscopy;  Laterality: N/A;  . HERNIA REPAIR    . KNEE SURGERY Left   . NECK SURGERY    . ROTATOR CUFF REPAIR Right     Family History  Problem Relation Age of Onset  . Lung cancer Mother   . Heart disease Father   . Prostate cancer Maternal Uncle     Social History Social History   Tobacco Use  . Smoking status: Former Smoker    Packs/day: 0.25    Years: 30.00    Pack years: 7.50    Types: Cigarettes    Quit date: 02/19/1994    Years since quitting: 24.4  . Smokeless tobacco: Never Used  Substance Use Topics  . Alcohol use: No    Frequency: Never  . Drug use: No    No Known Allergies  Current Outpatient Medications  Medication Sig Dispense Refill  . diltiazem (CARDIZEM CD) 300 MG 24 hr capsule Take 1 capsule (300 mg total) by mouth daily. 90 capsule 3  . famotidine (PEPCID) 20 MG tablet Take 1 tablet (20 mg total) by mouth daily. 30 tablet 1  . fluticasone-salmeterol (ADVAIR HFA) 115-21 MCG/ACT inhaler Inhale 2 puffs into the lungs 2 (two) times daily. 1 Inhaler 12  . ketoconazole (NIZORAL) 2 % cream APP AA ON SKIN BID    . omeprazole (PRILOSEC) 20 MG capsule Take 1 capsule (20 mg total) by mouth daily. 30 capsule 6  . PROAIR HFA  108 (90 Base) MCG/ACT inhaler INL 2 PFS PO Q 4 TO 6 H PRN    . SPIRIVA HANDIHALER 18 MCG inhalation capsule USE 1 INHALATION VIA HANDIHALER DAILY    . apixaban (ELIQUIS) 5 MG TABS tablet Take 1 tablet (5 mg total) by mouth 2 (two) times daily for 30 days. 60 tablet 2   No current facility-administered medications for this visit.     Review of Systems Review of Systems  Constitutional: Negative.   Respiratory: Negative.   Cardiovascular: Negative.     Blood pressure 139/83, pulse 70, temperature (!) 97.2 F (36.2 C), temperature source Temporal, resp. rate 18, height 6\' 2"  (1.88 m), weight 299 lb 12.8 oz (136 kg), SpO2 95 %.  Physical Exam Physical  Exam Constitutional:      Appearance: Normal appearance.  Pulmonary:     Effort: Pulmonary effort is normal.  Abdominal:     General: Bowel sounds are normal.     Palpations: Abdomen is soft.     Tenderness: There is no abdominal tenderness.    Skin:    General: Skin is warm and dry.  Neurological:     Mental Status: He is alert.     Data Reviewed March 23, 2018 CT scan of the abdomen and pelvis completed for lower abdominal pain of 2 years duration was independently reviewed.  There is marked thinning of the fascia at the umbilicus suggestive of a small 2 cm breast defect.  The fascia above this level is intact.  This is unchanged from his Jun 25, 2013 CT scan of the abdomen and pelvis.  Assessment Asymptomatic umbilical hernia, no source for the epigastric discomfort the patient reports.  Plan The patient reports that the contour of his umbilicus is unchanged over years and is asymptomatic.  Considering his ongoing anticoagulation and lack of symptoms and stability by imaging would not recommend elective intervention at this time.  Please call our office with any questions or concerns.    HPI, Physical Exam, Assessment and Plan have been scribed under the direction and in the presence of Robert Bellow, MD. Jonnie Finner, CMA  I have completed the exam and reviewed the above documentation for accuracy and completeness.  I agree with the above.  Haematologist has been used and any errors in dictation or transcription are unintentional.  Hervey Ard, M.D., F.A.C.S.  Forest Gleason Shylo Dillenbeck 08/03/2018, 2:33 PM

## 2018-07-31 NOTE — Patient Instructions (Addendum)
Please call our office with any questions or concerns.      Umbilical Hernia, Adult  A hernia is a bulge of tissue that pushes through an opening between muscles. An umbilical hernia happens in the abdomen, near the belly button (umbilicus). The hernia may contain tissues from the small intestine, large intestine, or fatty tissue covering the intestines (omentum). Umbilical hernias in adults tend to get worse over time, and they require surgical treatment. There are several types of umbilical hernias. You may have:  A hernia located just above or below the umbilicus (indirect hernia). This is the most common type of umbilical hernia in adults.  A hernia that forms through an opening formed by the umbilicus (direct hernia).  A hernia that comes and goes (reducible hernia). A reducible hernia may be visible only when you strain, lift something heavy, or cough. This type of hernia can be pushed back into the abdomen (reduced).  A hernia that traps abdominal tissue inside the hernia (incarcerated hernia). This type of hernia cannot be reduced.  A hernia that cuts off blood flow to the tissues inside the hernia (strangulated hernia). The tissues can start to die if this happens. This type of hernia requires emergency treatment. What are the causes? An umbilical hernia happens when tissue inside the abdomen presses on a weak area of the abdominal muscles. What increases the risk? You may have a greater risk of this condition if you:  Are obese.  Have had several pregnancies.  Have a buildup of fluid inside your abdomen (ascites).  Have had surgery that weakens the abdominal muscles. What are the signs or symptoms? The main symptom of this condition is a painless bulge at or near the belly button. A reducible hernia may be visible only when you strain, lift something heavy, or cough. Other symptoms may include:  Dull pain.  A feeling of pressure. Symptoms of a strangulated hernia may  include:  Pain that gets increasingly worse.  Nausea and vomiting.  Pain when pressing on the hernia.  Skin over the hernia becoming red or purple.  Constipation.  Blood in the stool. How is this diagnosed? This condition may be diagnosed based on:  A physical exam. You may be asked to cough or strain while standing. These actions increase the pressure inside your abdomen and force the hernia through the opening in your muscles. Your health care provider may try to reduce the hernia by pressing on it.  Your symptoms and medical history. How is this treated? Surgery is the only treatment for an umbilical hernia. Surgery for a strangulated hernia is done as soon as possible. If you have a small hernia that is not incarcerated, you may need to lose weight before having surgery. Follow these instructions at home:  Lose weight, if told by your health care provider.  Do not try to push the hernia back in.  Watch your hernia for any changes in color or size. Tell your health care provider if any changes occur.  You may need to avoid activities that increase pressure on your hernia.  Do not lift anything that is heavier than 10 lb (4.5 kg) until your health care provider says that this is safe.  Take over-the-counter and prescription medicines only as told by your health care provider.  Keep all follow-up visits as told by your health care provider. This is important. Contact a health care provider if:  Your hernia gets larger.  Your hernia becomes painful. Get help right  away if:  You develop sudden, severe pain near the area of your hernia.  You have pain as well as nausea or vomiting.  You have pain and the skin over your hernia changes color.  You develop a fever. This information is not intended to replace advice given to you by your health care provider. Make sure you discuss any questions you have with your health care provider. Document Released: 07/08/2015 Document  Revised: 03/20/2017 Document Reviewed: 08/06/2016 Elsevier Interactive Patient Education  2019 [employer].

## 2018-08-03 ENCOUNTER — Other Ambulatory Visit: Payer: Self-pay | Admitting: Internal Medicine

## 2018-08-04 NOTE — Telephone Encounter (Signed)
Pt's wt 136 kg, age 73, SCr 1.22, CrCl 106.83.

## 2018-08-04 NOTE — Telephone Encounter (Signed)
Refill Request.  

## 2018-10-02 ENCOUNTER — Inpatient Hospital Stay: Payer: Medicare Other

## 2018-10-02 ENCOUNTER — Inpatient Hospital Stay: Payer: Medicare Other | Admitting: Oncology

## 2018-10-07 ENCOUNTER — Other Ambulatory Visit: Payer: Self-pay

## 2018-10-08 ENCOUNTER — Other Ambulatory Visit: Payer: Self-pay

## 2018-10-08 ENCOUNTER — Ambulatory Visit (INDEPENDENT_AMBULATORY_CARE_PROVIDER_SITE_OTHER): Payer: Medicare Other | Admitting: Gastroenterology

## 2018-10-08 ENCOUNTER — Encounter: Payer: Self-pay | Admitting: Gastroenterology

## 2018-10-08 VITALS — BP 124/78 | HR 66 | Temp 96.0°F | Ht 74.0 in | Wt 296.8 lb

## 2018-10-08 DIAGNOSIS — K219 Gastro-esophageal reflux disease without esophagitis: Secondary | ICD-10-CM | POA: Diagnosis not present

## 2018-10-08 NOTE — Patient Instructions (Signed)
During today's office visit Dr.Tahiliani has recommended stopping Omeprazole, and starting Famotidine  20 mg daily.  This medication is to help you with your GERD symptoms.  Please follow up with Dr. Bonna Gains in 6 months-sooner if you need [place].  Thank you,  [facility]

## 2018-10-08 NOTE — Progress Notes (Signed)
Vonda Antigua, MD [address]  [place]  [place], [place] [address]  Main: [phone]  Fax: [phone]   Primary Care Physician: Lind   Chief Complaint  Patient presents with  . Follow-up    Abdominal pain    HPI: Zachary Sweeney is a 73 y.o. male here for follow-up of abdominal pain which is now significantly improved.  He is on Pepcid once daily.  Denies any heartburn. The patient denies abdominal or flank pain, anorexia, nausea or vomiting, dysphagia, change in bowel habits or black or bloody stools or weight loss.  He underwent EGD for abdominal pain and colonoscopy for positive occult blood test in June 2020.  Impression:           - Salmon-colored mucosa suggestive of Barrett's                        esophagus. Biopsied.                       - Small hiatal hernia.                       - Normal stomach.                       - Normal duodenal bulb, second portion of the duodenum                        and examined duodenum.  Impression:           - Eight 4 to 6 mm polyps in the sigmoid colon, in the                        descending colon, in the transverse colon, in the                        ascending colon and in the cecum, removed with a cold                        snare. Resected and retrieved.                       - Three 2 to 4 mm polyps in the ascending colon and in                        the cecum, removed with a cold biopsy forceps. Resected                        and retrieved.                       - Diverticulosis.                       - The examination was otherwise normal.                       - The rectum, sigmoid colon, descending colon,                        transverse colon, ascending colon and cecum are normal.                       -  The distal rectum and anal verge are normal on                        retroflexion view.  Surgical Pathology  DIAGNOSIS:  A. GEJ; COLD BIOPSY:  - SQUAMOCOLUMNAR  MUCOSA WITH MODERATE ACTIVE GASTROESOPHAGITIS.  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  - SQUAMOUS MUCOSA WITH UP TO 40 EOSINOPHILS IN A HIGH-POWER FIELD AND  ASSOCIATED WITH MILD ACUTE INFLAMMATION (INTRAEPITHELIAL NEUTROPHILS),  SEE COMMENT #2.  - PASF STAIN WILL BE REPORTED IN AN ADDENDUM.  - NEGATIVE FOR DYSPLASIA AND MALIGNANCY.   B. ESOPHAGUS, DISTAL; COLD BIOPSY:  - SQUAMOUS MUCOSA WITH UP TO 5 EOSINOPHILS IN A HIGH-POWER FIELD, SEE  COMMENT #2.  - NEGATIVE FOR DYSPLASIA AND MALIGNANCY.   C. ESOPHAGUS, PROXIMAL; COLD BIOPSY:  - SQUAMOUS MUCOSA WITH RARE INTRAEPITHELIAL EOSINOPHIL (UP TO 1  EOSINOPHIL IN 10 HIGH-POWER FIELDS), SEE COMMENT #2.  - FOCAL GLYCOGEN ACANTHOSIS  - NEGATIVE FOR DYSPLASIA AND MALIGNANCY.   D. COLON POLYP X3, CECUM; COLD BIOPSY (2) AND COLD SNARE (1):  - TUBULAR ADENOMA (4).  - NEGATIVE FOR HIGH-GRADE DYSPLASIA AND MALIGNANCY.   E. COLON POLYP X4, ASCENDING AND TRANSVERSE; COLD SNARE (3) AND COLD  BIOPSY (1):  - TUBULAR ADENOMA (5).  - NEGATIVE FOR HIGH-GRADE DYSPLASIA AND MALIGNANCY.   F. COLON POLYP X4, LEFT; COLD SNARE:  - HYPERPLASTIC POLYP (4).  - COLONIC MUCOSA WITH PROMINENT LYMPHOID AGGREGATE (1).  - NEGATIVE FOR DYSPLASIA AND MALIGNANCY.   Comment #1:  The biopsy of the GEJ (specimen A)shows gastric type mucosa with  scattered goblet cells. The diagnosis in this case depends on the  location of this biopsy. If this biopsy was taken from the tubular  esophagus at least 1 cm above the gastric fold it shows Barrett's mucosa  of the distinctive type. If this biopsy was taken from the gastric  cardia it shows intestinal metaplasia of the gastric cardia.  Per [employer] of Gastroenterology 2016 guidelines Barrett  esophagus should be diagnosed when there is extension of salmon-colored  mucosa into the tubular esophagus extending =1 cm proximal to the  gastroesophageal junction with  biopsy confirmation of intestinal  metaplasia.   Reference:  Am J Gastroenterol advance online publication, 22 December 2013   Comment #2:  Based on the histologic findings with distribution and concentration of  eosinophils in the GEJ, proximal and distal esophageal biopsies  (specimens A, B and C) reflux gastroesophagitis is favored though  partially treated eosinophilic esophagitis remains a diagnostic  possibility depending on the patient's clinical history.    Current Outpatient Medications  Medication Sig Dispense Refill  . apixaban (ELIQUIS) 5 MG TABS tablet Take 1 tablet (5 mg total) by mouth 2 (two) times daily. NEED APPT w/ Dr. Saunders Revel 60 tablet 1  . clotrimazole (LOTRIMIN) 1 % cream     . diltiazem (CARDIZEM CD) 300 MG 24 hr capsule Take 1 capsule (300 mg total) by mouth daily. 90 capsule 3  . fluticasone-salmeterol (ADVAIR HFA) 115-21 MCG/ACT inhaler Inhale 2 puffs into the lungs 2 (two) times daily. 1 Inhaler 12  . ketoconazole (NIZORAL) 2 % cream APP AA ON SKIN BID    . ketoconazole (NIZORAL) 2 % shampoo     . PROAIR HFA 108 (90 Base) MCG/ACT inhaler INL 2 PFS PO Q 4 TO 6 H PRN    . SPIRIVA HANDIHALER 18 MCG inhalation capsule USE  1 INHALATION VIA HANDIHALER DAILY    . famotidine (PEPCID) 20 MG tablet Take 1 tablet (20 mg total) by mouth daily. (Patient not taking: Reported on 10/08/2018) 30 tablet 1  . omeprazole (PRILOSEC) 20 MG capsule Take 1 capsule (20 mg total) by mouth daily. 30 capsule 6   No current facility-administered medications for this visit.     Allergies as of 10/08/2018  . (No Known Allergies)    ROS:  General: Negative for anorexia, weight loss, fever, chills, fatigue, weakness. ENT: Negative for hoarseness, difficulty swallowing , nasal congestion. CV: Negative for chest pain, angina, palpitations, dyspnea on exertion, peripheral edema.  Respiratory: Negative for dyspnea at rest, dyspnea on exertion, cough, sputum, wheezing.  GI: See history of  present illness. GU:  Negative for dysuria, hematuria, urinary incontinence, urinary frequency, nocturnal urination.  Endo: Negative for unusual weight change.    Physical Examination:   BP 124/78   Pulse 66   Temp (!) 96 F (35.6 C) (Oral)   Ht 6\' 2"  (1.88 m)   Wt 296 lb 12.8 oz (134.6 kg)   BMI 38.11 kg/m   General: Well-nourished, well-developed in no acute distress.  Eyes: No icterus. Conjunctivae pink. Mouth: Oropharyngeal mucosa moist and pink , no lesions erythema or exudate. Neck: Supple, Trachea midline Abdomen: Bowel sounds are normal, nontender, nondistended, no hepatosplenomegaly or masses, no abdominal bruits or hernia , no rebound or guarding.   Extremities: No lower extremity edema. No clubbing or deformities. Neuro: Alert and oriented x 3.  Grossly intact. Skin: Warm and dry, no jaundice.   Psych: Alert and cooperative, normal mood and affect.   Labs: CMP     Component Value Date/Time   NA 136 07/01/2018 1336   NA 139 04/15/2018 1431   K 4.0 07/01/2018 1336   CL 105 07/01/2018 1336   CO2 25 07/01/2018 1336   GLUCOSE 96 07/01/2018 1336   BUN 15 07/01/2018 1336   BUN 9 04/15/2018 1431   CREATININE 1.22 07/01/2018 1336   CALCIUM 9.3 07/01/2018 1336   PROT 7.7 07/01/2018 1336   PROT 7.3 04/15/2018 1431   ALBUMIN 4.0 07/01/2018 1336   ALBUMIN 4.2 04/15/2018 1431   AST 23 07/01/2018 1336   ALT 19 07/01/2018 1336   ALKPHOS 56 07/01/2018 1336   BILITOT 1.0 07/01/2018 1336   BILITOT 0.7 04/15/2018 1431   GFRNONAA 60 (L) 07/01/2018 1336   GFRAA >60 07/01/2018 1336   Lab Results  Component Value Date   WBC 7.8 07/01/2018   HGB 17.0 07/01/2018   HCT 49.4 07/01/2018   MCV 88.1 07/01/2018   PLT 153 07/01/2018    Imaging Studies: No results found.  Assessment and Plan:   Zachary Sweeney is a 73 y.o. y/o male with abdominal pain here for follow-up  Abdominal pain is now resolved  Patient saw surgery for abdominal wall hernia and surgical  intervention was not recommended  The focal intestinal metaplasia on the GE junction biopsies will need to be reevaluated with repeat biopsies in the future, to determine if it represents Barrett's esophagus or gastric cardia intestinal mediastinal metaplasia, as the salmon-colored mucosa in the esophagus is noted to be less than 1 cm in length.  His acid reflux is well controlled  Clinic follow-up in 6 months to reassess symptoms and schedule EGD at that time  Patient educated extensively on acid reflux lifestyle modification, including buying a bed wedge, not eating 3 hrs before bedtime, diet modifications, and handout given for  the same.      Dr Vonda Antigua

## 2018-10-09 ENCOUNTER — Telehealth: Payer: Self-pay

## 2018-10-09 NOTE — Telephone Encounter (Signed)
Patient has been advised that he can take Pepcid/Famotidine this is not on the recall list, however Zantac is.  Thanks,  Zachary Sweeney

## 2018-10-17 ENCOUNTER — Other Ambulatory Visit: Payer: Self-pay

## 2018-10-17 NOTE — Progress Notes (Signed)
Patient denies any concerns today.  

## 2018-10-20 ENCOUNTER — Inpatient Hospital Stay (HOSPITAL_BASED_OUTPATIENT_CLINIC_OR_DEPARTMENT_OTHER): Payer: Medicare Other | Admitting: Oncology

## 2018-10-20 ENCOUNTER — Inpatient Hospital Stay: Payer: Medicare Other | Attending: Oncology

## 2018-10-20 ENCOUNTER — Other Ambulatory Visit: Payer: Self-pay | Admitting: Internal Medicine

## 2018-10-20 ENCOUNTER — Other Ambulatory Visit: Payer: Self-pay

## 2018-10-20 VITALS — BP 125/75 | HR 64 | Temp 97.1°F | Resp 18 | Wt 293.5 lb

## 2018-10-20 DIAGNOSIS — Z7901 Long term (current) use of anticoagulants: Secondary | ICD-10-CM

## 2018-10-20 DIAGNOSIS — I10 Essential (primary) hypertension: Secondary | ICD-10-CM | POA: Diagnosis not present

## 2018-10-20 DIAGNOSIS — Z86718 Personal history of other venous thrombosis and embolism: Secondary | ICD-10-CM | POA: Insufficient documentation

## 2018-10-20 DIAGNOSIS — J439 Emphysema, unspecified: Secondary | ICD-10-CM | POA: Insufficient documentation

## 2018-10-20 DIAGNOSIS — I82431 Acute embolism and thrombosis of right popliteal vein: Secondary | ICD-10-CM

## 2018-10-20 DIAGNOSIS — Z7951 Long term (current) use of inhaled steroids: Secondary | ICD-10-CM | POA: Diagnosis not present

## 2018-10-20 DIAGNOSIS — Z87891 Personal history of nicotine dependence: Secondary | ICD-10-CM | POA: Insufficient documentation

## 2018-10-20 DIAGNOSIS — Z79899 Other long term (current) drug therapy: Secondary | ICD-10-CM | POA: Insufficient documentation

## 2018-10-20 DIAGNOSIS — K219 Gastro-esophageal reflux disease without esophagitis: Secondary | ICD-10-CM

## 2018-10-20 LAB — COMPREHENSIVE METABOLIC PANEL
ALT: 20 U/L (ref 0–44)
AST: 24 U/L (ref 15–41)
Albumin: 4.2 g/dL (ref 3.5–5.0)
Alkaline Phosphatase: 45 U/L (ref 38–126)
Anion gap: 7 (ref 5–15)
BUN: 14 mg/dL (ref 8–23)
CO2: 24 mmol/L (ref 22–32)
Calcium: 9.2 mg/dL (ref 8.9–10.3)
Chloride: 107 mmol/L (ref 98–111)
Creatinine, Ser: 1.26 mg/dL — ABNORMAL HIGH (ref 0.61–1.24)
GFR calc Af Amer: >60 mL/min (ref >60)
GFR calc non Af Amer: 57 mL/min — ABNORMAL LOW (ref >60)
Glucose, Bld: 104 mg/dL — ABNORMAL HIGH (ref 70–99)
Potassium: 3.9 mmol/L (ref 3.5–5.1)
Sodium: 138 mmol/L (ref 135–145)
Total Bilirubin: 1.2 mg/dL (ref 0.3–1.2)
Total Protein: 7.7 g/dL (ref 6.5–8.1)

## 2018-10-20 LAB — CBC WITH DIFFERENTIAL/PLATELET
Abs Immature Granulocytes: 0.01 10*3/uL (ref 0.00–0.07)
Basophils Absolute: 0 10*3/uL (ref 0.0–0.1)
Basophils Relative: 1 %
Eosinophils Absolute: 0.1 10*3/uL (ref 0.0–0.5)
Eosinophils Relative: 2 %
HCT: 48 % (ref 39.0–52.0)
Hemoglobin: 16.4 g/dL (ref 13.0–17.0)
Immature Granulocytes: 0 %
Lymphocytes Relative: 36 %
Lymphs Abs: 2.3 10*3/uL (ref 0.7–4.0)
MCH: 30.2 pg (ref 26.0–34.0)
MCHC: 34.2 g/dL (ref 30.0–36.0)
MCV: 88.4 fL (ref 80.0–100.0)
Monocytes Absolute: 0.6 10*3/uL (ref 0.1–1.0)
Monocytes Relative: 9 %
Neutro Abs: 3.4 10*3/uL (ref 1.7–7.7)
Neutrophils Relative %: 52 %
Platelets: 141 10*3/uL — ABNORMAL LOW (ref 150–400)
RBC: 5.43 MIL/uL (ref 4.22–5.81)
RDW: 13.8 % (ref 11.5–15.5)
WBC: 6.4 10*3/uL (ref 4.0–10.5)
nRBC: 0 % (ref 0.0–0.2)

## 2018-10-20 MED ORDER — APIXABAN 2.5 MG PO TABS: 2.5000 mg | ORAL_TABLET | Freq: Two times a day (BID) | ORAL | 1 refills | Status: DC

## 2018-10-20 NOTE — Progress Notes (Signed)
Patient does not offer any problems today.  

## 2018-10-20 NOTE — Telephone Encounter (Signed)
Please review for refill. Thanks!  

## 2018-10-21 ENCOUNTER — Encounter: Payer: Self-pay | Admitting: Oncology

## 2018-10-21 NOTE — Progress Notes (Signed)
Hematology/Oncology follow-up progress note [facility] [phone] [phone]   Patient Care Team: [place] as PCP - General End, Harrell Gave, MD as PCP - Cardiology (Cardiology)  REFERRING PROVIDER: Dr.Tahiliani Margretta Sidle CHIEF COMPLAINTS/REASON FOR VISIT:  Follow-up for DVT  HISTORY OF PRESENTING ILLNESS:  Zachary Sweeney is a  73 y.o.  male with PMH listed below who was referred to me for evaluation of DVT Patient recently established care with gastroenterology for consultation and management of chronic history of diffuse abdominal pain as well as epigastric pain.  Not related to meals, exacerbated when he bends down.  Not associated with nausea or vomiting. Denies any weight loss, fever, chills, night sweats.  Appetite is fair. Denies hematochezia, hematuria, hematemesis, epistaxis, black tarry stool or easy bruising.  Endoscopy procedures were recommended for further evaluation.  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  Patient was referred by gastroenterology to me for further evaluation and peri-procedure anticoagulation recommendation. Patient also was found to have an abdominal wall hernia which caused this pain with bending down.  He was also referred to see surgery Dr. Hampton Abbot for further evaluation.  06/25/2017, patient had US venous bilateral lower extremity which showed a focal area of partial compressibility popliteal Vein on the right, compatible with a partially occlusive DVT.  The borders are smooth and this has a subacute appearance. Left lower extremity no DVT.   Patient recalls that he had right foot pain and swelling, as the initial presentation which prompted ultrasound evaluation. Patient was started on Eliquis after ED discussed his case with vascular surgery Dr. Lucky Cowboy. Patient was taken off Eliquis in November 2019. Presented to [facility] emergency room on 03/30/2018  complaining right lower extremity pain and swelling again which is similar to when he had a blood clot.  Patient was concerned that he could have developed blood clots again so he restarted taking Eliquis for the past week prior to presenting to the emergency room.  He mentions to a friend at the church and they urged him to go to emergency room. In the ED, ultrasound showed nonocclusive right lower extremity DVT. 04/10/2018, he had CT abdomen pelvis done for evaluation of chronic abdominal pain.  CT showed no acute findings in the abdomen and pelvis.  Nonobstructive punctate stones over the lower pole collecting system of the right kidney.  Small umbilical hernia containing only peritoneal fat.  Aortic atherosclerosis.  Patient reports feeling well today.  Right lower extremity pain and swelling has improved.  He is taking Eliquis 5 mg twice daily since February 2020.  Tolerates well.  No bleeding events.   INTERVAL HISTORY CAROLOS Sweeney is a 73 y.o. male who has above history reviewed by me today presents for follow up visit for management of DVT Problems and complaints are listed below: Patient had a virtual visit with me on 07/02/2018.   Patient has been on Eliquis 5 mg twice daily and has finished anticoagulation for 6 months now. He had GI work-up done on 07/24/2018. Colonoscopy showed colon polyps which were resected and retrieved.  Diverticulosis.  Biopsy showed tubular adenoma.  Recommend repeat colonoscopy in 3 years negative for high-grade dysplasia and malignancy. EGD showed salmon-colored mucosa suggestive of Barrett's esophagus.  Biopsied.  Per GI note, biopsy showed evidence of acid reflux.  Patient was started on Pepcid. Patient reports feeling well at baseline today.  No pain.  Denies hematochezia, hematuria, hematemesis, epistaxis, black tarry stool or easy  bruising.    Review of Systems  Constitutional: Negative for appetite change, chills, fatigue, fever and unexpected weight  change.  HENT:   Negative for hearing loss and voice change.   Eyes: Negative for eye problems and icterus.  Respiratory: Negative for chest tightness, cough and shortness of breath.   Cardiovascular: Negative for chest pain and leg swelling.  Gastrointestinal: Negative for abdominal distention, abdominal pain, nausea and vomiting.  Endocrine: Negative for hot flashes.  Genitourinary: Negative for difficulty urinating, dysuria and frequency.   Musculoskeletal: Negative for arthralgias.  Skin: Negative for itching and rash.  Neurological: Negative for light-headedness and numbness.  Hematological: Negative for adenopathy. Does not bruise/bleed easily.  Psychiatric/Behavioral: Negative for confusion.    MEDICAL HISTORY:  Past Medical History:  Diagnosis Date  . Acid reflux   . Back injury   . Colon polyp   . DVT (deep venous thrombosis) ([place])    a. 06/2017 LE U/S: Right popliteal vein DVT.  Marland Kitchen Dyspnea on exertion    a. 06/2017 CTA Chest: No PE. No significant coronary Ca2+, centrilobular and paraseptal emphysema;  b.  07/2017 Echo: EF 60-65%, no rwma, Gr1 DD, mildly dil LA. Nl RV fxn; c. 07/2017 MV: Small, severe defect involving apical inf and apical segments - only on rest images consistent w/ artifact. No ischemia or scar.  . Emphysema of lung ([place])    a. 06/2017 CTA Chest: Centrilobular and paraseptal emphysema, with mild geographic ground-glass opacity likely representing small airway dzs.  Marland Kitchen Hypertension   . Torn ligament     SURGICAL HISTORY: Past Surgical History:  Procedure Laterality Date  . COLONOSCOPY  2008  . COLONOSCOPY WITH PROPOFOL N/A 02/27/2017   Procedure: COLONOSCOPY WITH PROPOFOL;  Surgeon: Robert Bellow, MD;  Location: [facility] ENDOSCOPY;  Service: Endoscopy;  Laterality: N/A;  . COLONOSCOPY WITH PROPOFOL N/A 07/24/2018   Procedure: COLONOSCOPY WITH PROPOFOL;  Surgeon: Virgel Manifold, MD;  Location: [facility] ENDOSCOPY;  Service: Endoscopy;  Laterality: N/A;  .  ESOPHAGOGASTRODUODENOSCOPY (EGD) WITH PROPOFOL N/A 02/27/2017   Procedure: ESOPHAGOGASTRODUODENOSCOPY (EGD) WITH PROPOFOL;  Surgeon: Robert Bellow, MD;  Location: [facility] ENDOSCOPY;  Service: Endoscopy;  Laterality: N/A;  . ESOPHAGOGASTRODUODENOSCOPY (EGD) WITH PROPOFOL N/A 07/24/2018   Procedure: ESOPHAGOGASTRODUODENOSCOPY (EGD) WITH PROPOFOL;  Surgeon: Virgel Manifold, MD;  Location: [facility] ENDOSCOPY;  Service: Endoscopy;  Laterality: N/A;  . HERNIA REPAIR    . KNEE SURGERY Left   . NECK SURGERY    . ROTATOR CUFF REPAIR Right     SOCIAL HISTORY: Social History   Socioeconomic History  . Marital status: Single    Spouse name: Not on file  . Number of children: Not on file  . Years of education: Not on file  . Highest education level: Not on file  Occupational History  . Occupation: retired  Scientific laboratory technician  . Financial resource strain: Not on file  . Food insecurity    Worry: Not on file    Inability: Not on file  . Transportation needs    Medical: Not on file    Non-medical: Not on file  Tobacco Use  . Smoking status: Former Smoker    Packs/day: 0.25    Years: 30.00    Pack years: 7.50    Types: Cigarettes    Quit date: 02/19/1994    Years since quitting: 24.6  . Smokeless tobacco: Never Used  Substance and Sexual Activity  . Alcohol use: No    Frequency: Never  . Drug use: No  .  Sexual activity: Not on file  Lifestyle  . Physical activity    Days per week: Not on file    Minutes per session: Not on file  . Stress: Not on file  Relationships  . Social Herbalist on phone: Not on file    Gets together: Not on file    Attends religious service: Not on file    Active member of club or organization: Not on file    Attends meetings of clubs or organizations: Not on file    Relationship status: Not on file  . Intimate partner violence    Fear of current or ex partner: Not on file    Emotionally abused: Not on file    Physically abused: Not on file     Forced sexual activity: Not on file  Other Topics Concern  . Not on file  Social History Narrative  . Not on file    FAMILY HISTORY: Family History  Problem Relation Age of Onset  . Lung cancer Mother   . Heart disease Father   . Prostate cancer Maternal Uncle     ALLERGIES:  has No Known Allergies.  MEDICATIONS:  Current Outpatient Medications  Medication Sig Dispense Refill  . clotrimazole (LOTRIMIN) 1 % cream     . diltiazem (CARDIZEM CD) 300 MG 24 hr capsule Take 1 capsule (300 mg total) by mouth daily. 90 capsule 3  . fluticasone-salmeterol (ADVAIR HFA) 115-21 MCG/ACT inhaler Inhale 2 puffs into the lungs 2 (two) times daily. 1 Inhaler 12  . ketoconazole (NIZORAL) 2 % cream APP AA ON SKIN BID    . ketoconazole (NIZORAL) 2 % shampoo     . PROAIR HFA 108 (90 Base) MCG/ACT inhaler INL 2 PFS PO Q 4 TO 6 H PRN    . SPIRIVA HANDIHALER 18 MCG inhalation capsule USE 1 INHALATION VIA HANDIHALER DAILY    . apixaban (ELIQUIS) 2.5 MG TABS tablet Take 1 tablet (2.5 mg total) by mouth 2 (two) times daily. 180 tablet 1  . famotidine (PEPCID) 20 MG tablet Take 1 tablet (20 mg total) by mouth daily. (Patient not taking: Reported on 10/17/2018) 30 tablet 1  . omeprazole (PRILOSEC) 20 MG capsule Take 1 capsule (20 mg total) by mouth daily. 30 capsule 6   No current facility-administered medications for this visit.      PHYSICAL EXAMINATION: ECOG PERFORMANCE STATUS: 0 - Asymptomatic Vitals:   10/20/18 1002  BP: 125/75  Pulse: 64  Resp: 18  Temp: (!) 97.1 F (36.2 C)   Filed Weights   10/20/18 1002  Weight: 293 lb 8 oz (133.1 kg)    Physical Exam Constitutional:      General: He is not in acute distress. HENT:     Head: Normocephalic and atraumatic.  Eyes:     General: No scleral icterus.    Pupils: Pupils are equal, round, and reactive to light.  Neck:     Musculoskeletal: Normal range of motion and neck supple.  Cardiovascular:     Rate and Rhythm: Normal rate and  regular rhythm.     Heart sounds: Normal heart sounds.  Pulmonary:     Effort: Pulmonary effort is normal. No respiratory distress.     Breath sounds: No wheezing.  Abdominal:     General: Bowel sounds are normal. There is no distension.     Palpations: Abdomen is soft. There is no mass.     Tenderness: There is no abdominal tenderness.  Musculoskeletal:  Normal range of motion.        General: No deformity.  Skin:    General: Skin is warm and dry.     Findings: No erythema or rash.  Neurological:     Mental Status: He is alert and oriented to person, place, and time.     Cranial Nerves: No cranial nerve deficit.     Coordination: Coordination normal.  Psychiatric:        Behavior: Behavior normal.        Thought Content: Thought content normal.     RADIOGRAPHIC STUDIES: I have personally reviewed the radiological images as listed and agreed with the findings in the report.  CMP Latest Ref Rng & Units 10/20/2018  Glucose 70 - 99 mg/dL 104(H)  BUN 8 - 23 mg/dL 14  Creatinine 0.61 - 1.24 mg/dL 1.26(H)  Sodium 135 - 145 mmol/L 138  Potassium 3.5 - 5.1 mmol/L 3.9  Chloride 98 - 111 mmol/L 107  CO2 22 - 32 mmol/L 24  Calcium 8.9 - 10.3 mg/dL 9.2  Total Protein 6.5 - 8.1 g/dL 7.7  Total Bilirubin 0.3 - 1.2 mg/dL 1.2  Alkaline Phos 38 - 126 U/L 45  AST 15 - 41 U/L 24  ALT 0 - 44 U/L 20   CBC Latest Ref Rng & Units 10/20/2018  WBC 4.0 - 10.5 K/uL 6.4  Hemoglobin 13.0 - 17.0 g/dL 16.4  Hematocrit 39.0 - 52.0 % 48.0  Platelets 150 - 400 K/uL 141(L)    LABORATORY DATA:  I have reviewed the data as listed Lab Results  Component Value Date   WBC 6.4 10/20/2018   HGB 16.4 10/20/2018   HCT 48.0 10/20/2018   MCV 88.4 10/20/2018   PLT 141 (L) 10/20/2018   Recent Labs    04/15/18 1431 07/01/18 1336 10/20/18 0939  NA 139 136 138  K 4.8 4.0 3.9  CL 101 105 107  CO2 22 25 24   GLUCOSE 92 96 104*  BUN 9 15 14   CREATININE 1.14 1.22 1.26*  CALCIUM 9.6 9.3 9.2  GFRNONAA 65  60* 57*  GFRAA 75 >60 >60  PROT 7.3 7.7 7.7  ALBUMIN 4.2 4.0 4.2  AST 23 23 24   ALT 16 19 20   ALKPHOS 60 56 45  BILITOT 0.7 1.0 1.2   Iron/TIBC/Ferritin/ %Sat No results found for: IRON, TIBC, FERRITIN, IRONPCTSAT      ASSESSMENT & PLAN:  1. History of DVT of lower extremity   2. Chronic anticoagulation   3. Gastroesophageal reflux disease, esophagitis presence not specified    #History of recurrent DVT. Patient has been on Eliquis 5 mg twice daily. Tolerating well.  Labs are reviewed.  No anemia. Right lower extremity swelling and pain has completely resolved. 07/10/2018 US venous lower extremity unilateral right findings compatible with resolving isolated right popliteal DVT. Recommend patient to be switched to Eliquis 2.5 mg twice daily for long-term anticoagulation maintenance.  He voices understanding and agrees with the plan. Prescription sent to pharmacy.  GERD, patient has had GI work-up done.  Acid reflux.  Continue Pepcid.  Orders Placed This Encounter  Procedures  . CBC with Differential/Platelet    Standing Status:   Future    Standing Expiration Date:   10/20/2019  . Comprehensive metabolic panel    Standing Status:   Future    Standing Expiration Date:   10/20/2019    All questions were answered. The patient knows to call the clinic with any problems questions or concerns.  Return of visit: 6  months to discuss repeat ultrasound results and management plan. Earlie Server, MD, PhD Hematology Oncology [facility] at [facility] Pager- [phone] 10/21/2018

## 2018-11-13 ENCOUNTER — Telehealth: Payer: Self-pay | Admitting: *Deleted

## 2018-11-13 ENCOUNTER — Telehealth: Payer: Self-pay | Admitting: Internal Medicine

## 2018-11-13 NOTE — Telephone Encounter (Signed)
Call returned to [facility] and I spoke with Farrel Conners who will pass message on to [facility] to contact Dr End to see if there is another reason for patient needing Eliquis  Mg other than his DVT, if not, Dr Tasia Catchings would like to decrease dose to 2.5 mg

## 2018-11-13 NOTE — Telephone Encounter (Signed)
Patients PCP calling in stating the Dr. Tasia Catchings has changed the eliquis dosage from 5 mg bid to 2.5 mg. Both Dr. Tasia Catchings and Dr. Saunders Revel sent in contraindicating perscriptions and now the patient/pharmacy/ PCP office would like to be made aware of the correct dosage. Please advise above parties.   Patient uses [facility] on [address]

## 2018-11-13 NOTE — Telephone Encounter (Signed)
Caren Griffins from [facility] called reporting that Dr Tasia Catchings sent prescription to [facility] for Eliquis 2.5 mg and that Dr End also sent prescription for Eliquis 5 mg to [facility] and needs to know what dose patient is to be taking. Please advise.

## 2018-11-13 NOTE — Telephone Encounter (Signed)
Please let Dr.End know that for his history of DVT, I have decreased his Eliquis dose to 2.5mg  BID. I want to make sure that Dr.End is aware of that change and also make sure there is no other anticoagulation indication which makes Eliquis 5mg  BID more appropriate. Thanks.

## 2018-11-14 NOTE — Telephone Encounter (Signed)
Spoke with Judeen Hammans pharmacist at Dr Collie Siad office. She verbalized understanding to defer to Dr Collie Siad recommendations of Eliquis dosage of 2.5 mg.

## 2018-11-14 NOTE — Telephone Encounter (Signed)
Given that apixaban is being used for treatment of DVT, I will defer to Dr. Collie Siad recommendations.  Nelva Bush, MD [facility] Pager: [phone]

## 2018-11-14 NOTE — Telephone Encounter (Signed)
Judeen Hammans from [facility] calling in to check on the dosage status. Please advise at [phone]

## 2018-11-14 NOTE — Telephone Encounter (Signed)
Dr End, what dose do you advise that patient to be on?

## 2019-02-02 ENCOUNTER — Other Ambulatory Visit: Payer: Self-pay | Admitting: Physician Assistant

## 2019-02-02 DIAGNOSIS — Z87891 Personal history of nicotine dependence: Secondary | ICD-10-CM

## 2019-02-10 ENCOUNTER — Ambulatory Visit: Payer: Medicare Other

## 2019-03-18 ENCOUNTER — Emergency Department
Admission: EM | Admit: 2019-03-18 | Discharge: 2019-03-18 | Disposition: A | Discharge status: Home / Self Care | Payer: Medicare Other | Attending: Student | Admitting: Student

## 2019-03-18 ENCOUNTER — Other Ambulatory Visit: Payer: Self-pay

## 2019-03-18 DIAGNOSIS — I10 Essential (primary) hypertension: Secondary | ICD-10-CM | POA: Insufficient documentation

## 2019-03-18 DIAGNOSIS — Z86718 Personal history of other venous thrombosis and embolism: Secondary | ICD-10-CM | POA: Insufficient documentation

## 2019-03-18 DIAGNOSIS — M5481 Occipital neuralgia: Secondary | ICD-10-CM | POA: Insufficient documentation

## 2019-03-18 DIAGNOSIS — Z7901 Long term (current) use of anticoagulants: Secondary | ICD-10-CM | POA: Diagnosis not present

## 2019-03-18 DIAGNOSIS — Z87891 Personal history of nicotine dependence: Secondary | ICD-10-CM | POA: Diagnosis not present

## 2019-03-18 DIAGNOSIS — R519 Headache, unspecified: Secondary | ICD-10-CM | POA: Diagnosis not present

## 2019-03-18 DIAGNOSIS — J439 Emphysema, unspecified: Secondary | ICD-10-CM | POA: Diagnosis not present

## 2019-03-18 LAB — BASIC METABOLIC PANEL
Anion gap: 7 (ref 5–15)
BUN: 13 mg/dL (ref 8–23)
CO2: 25 mmol/L (ref 22–32)
Calcium: 9.4 mg/dL (ref 8.9–10.3)
Chloride: 105 mmol/L (ref 98–111)
Creatinine, Ser: 1.27 mg/dL — ABNORMAL HIGH (ref 0.61–1.24)
GFR calc Af Amer: >60 mL/min (ref >60)
GFR calc non Af Amer: 56 mL/min — ABNORMAL LOW (ref >60)
Glucose, Bld: 97 mg/dL (ref 70–99)
Potassium: 4.2 mmol/L (ref 3.5–5.1)
Sodium: 137 mmol/L (ref 135–145)

## 2019-03-18 LAB — CBC
HCT: 50.9 % (ref 39.0–52.0)
Hemoglobin: 17.2 g/dL — ABNORMAL HIGH (ref 13.0–17.0)
MCH: 29.8 pg (ref 26.0–34.0)
MCHC: 33.8 g/dL (ref 30.0–36.0)
MCV: 88.2 fL (ref 80.0–100.0)
Platelets: 150 10*3/uL (ref 150–400)
RBC: 5.77 MIL/uL (ref 4.22–5.81)
RDW: 13.7 % (ref 11.5–15.5)
WBC: 8.3 10*3/uL (ref 4.0–10.5)
nRBC: 0 % (ref 0.0–0.2)

## 2019-03-18 LAB — C-REACTIVE PROTEIN: CRP: 0.9 mg/dL (ref <1.0)

## 2019-03-18 LAB — SEDIMENTATION RATE: Sed Rate: 1 mm/hr (ref 0–20)

## 2019-03-18 MED ORDER — VALACYCLOVIR HCL 1 G PO TABS: 1000.0000 mg | ORAL_TABLET | Freq: Three times a day (TID) | ORAL | 0 refills | Status: AC

## 2019-03-18 NOTE — Discharge Instructions (Signed)
Thank you for letting us take care of you in the emergency department today.   Please continue to take any regular, prescribed medications.   New medications we have prescribed:  - Valacyclovir - fill and take this if your symptoms do not improve in 1-2 days OR begin taking immediately if you begin to notice a rash develop at the area of your pain  Please follow up with: - Your primary care doctor to review your ER visit and follow up on your symptoms.    Please return to the ER for any new or worsening symptoms.

## 2019-03-18 NOTE — ED Triage Notes (Signed)
Pt c/o left sided HA with tenderness to palpate for the past couple of days with tinnitus of the left ear today, denies any confusion, visual changes or other sx.

## 2019-03-18 NOTE — ED Notes (Signed)
Pt a/o, denies weakness, dizziness. Does have ringing in ear, better today than yesterday. Denies hx of migraines. Denies injury. Pt does take eliquis.

## 2019-03-18 NOTE — ED Provider Notes (Signed)
[facility] Emergency Department Provider Note  ____________________________________________   First MD Initiated Contact with Patient 03/18/19 1524     (approximate)  I have reviewed the triage vital signs and the nursing notes.  History  Chief Complaint Headache    HPI Zachary Sweeney is a 74 y.o. male with history of DVT, emphysema, HTN, GERD who presents to the ED for left sided head/scalp pain. Patient states he first developed pain several days ago, mild in severity. Not thunderclap or worst at onset, has been gradually worsening. Pain seems to be intermittent and comes and goes in waves, no particular alleviating component. Worsened with palpation and some head movements.  He describes the pain as a sharp/shooting type pain located to the left side scalp area, superior and posterolateral to the ear. Denies any ear pain itself.  Severity has gradually increased, prompting him to seek care today.  Also associated with some mild ear ringing which he describes as a "whooshing" like sensation. This started this AM and has improved throughout the day.  No associated redness to the scalp, rashes or lesions.  No facial droop, visual changes, speech difficulty, weakness, numbness.  He has not tried anything for his symptoms.  He denies any heavy aspirin use.  He denies any trauma, falls, or head injury.  He denies any recent illnesses or infections.  Does have a history of shingles to his abdomen area and states the symptoms feel somewhat similar.   Past Medical Hx Past Medical History:  Diagnosis Date  . Acid reflux   . Back injury   . Colon polyp   . DVT (deep venous thrombosis) ([place])    a. 06/2017 LE U/S: Right popliteal vein DVT.  Marland Kitchen Dyspnea on exertion    a. 06/2017 CTA Chest: No PE. No significant coronary Ca2+, centrilobular and paraseptal emphysema;  b.  07/2017 Echo: EF 60-65%, no rwma, Gr1 DD, mildly dil LA. Nl RV fxn; c. 07/2017 MV: Small, severe defect  involving apical inf and apical segments - only on rest images consistent w/ artifact. No ischemia or scar.  . Emphysema of lung ([place])    a. 06/2017 CTA Chest: Centrilobular and paraseptal emphysema, with mild geographic ground-glass opacity likely representing small airway dzs.  Marland Kitchen Hypertension   . Torn ligament     Problem List Patient Active Problem List   Diagnosis Date Noted  . Positive fecal occult blood test   . Polyp of colon   . Diverticulosis of large intestine without diverticulitis   . CLE (columnar lined esophagus)   . Hiatal hernia   . DOE (dyspnea on exertion) 07/19/2017  . Atypical chest pain 07/19/2017  . Deep vein thrombosis (DVT) of popliteal vein of right lower extremity ([place]) 07/19/2017  . Benign prostatic hyperplasia 06/25/2017  . Essential hypertension 06/25/2017  . Esophageal dysphagia 01/31/2017  . Epigastric pain 01/31/2017  . Obesity, unspecified 06/26/2013    Past Surgical Hx Past Surgical History:  Procedure Laterality Date  . COLONOSCOPY  2008  . COLONOSCOPY WITH PROPOFOL N/A 02/27/2017   Procedure: COLONOSCOPY WITH PROPOFOL;  Surgeon: Robert Bellow, MD;  Location: [facility] ENDOSCOPY;  Service: Endoscopy;  Laterality: N/A;  . COLONOSCOPY WITH PROPOFOL N/A 07/24/2018   Procedure: COLONOSCOPY WITH PROPOFOL;  Surgeon: Virgel Manifold, MD;  Location: [facility] ENDOSCOPY;  Service: Endoscopy;  Laterality: N/A;  . ESOPHAGOGASTRODUODENOSCOPY (EGD) WITH PROPOFOL N/A 02/27/2017   Procedure: ESOPHAGOGASTRODUODENOSCOPY (EGD) WITH PROPOFOL;  Surgeon: Robert Bellow, MD;  Location: [facility]  ENDOSCOPY;  Service: Endoscopy;  Laterality: N/A;  . ESOPHAGOGASTRODUODENOSCOPY (EGD) WITH PROPOFOL N/A 07/24/2018   Procedure: ESOPHAGOGASTRODUODENOSCOPY (EGD) WITH PROPOFOL;  Surgeon: Virgel Manifold, MD;  Location: [facility] ENDOSCOPY;  Service: Endoscopy;  Laterality: N/A;  . HERNIA REPAIR    . KNEE SURGERY Left   . NECK SURGERY    . ROTATOR CUFF REPAIR Right      Medications Prior to Admission medications   Medication Sig Start Date End Date Taking? Authorizing Provider  apixaban (ELIQUIS) 2.5 MG TABS tablet Take 1 tablet (2.5 mg total) by mouth 2 (two) times daily. 10/20/18   Earlie Server, MD  clotrimazole (LOTRIMIN) 1 % cream  09/18/18   [provider]  diltiazem (CARDIZEM CD) 300 MG 24 hr capsule Take 1 capsule (300 mg total) by mouth daily. 09/04/17   Theora Gianotti, NP  famotidine (PEPCID) 20 MG tablet Take 1 tablet (20 mg total) by mouth daily. Patient not taking: Reported on 10/17/2018 07/29/18   Virgel Manifold, MD  fluticasone-salmeterol (ADVAIR HFA) [phone] MCG/ACT inhaler Inhale 2 puffs into the lungs 2 (two) times daily. 09/06/17   Flora Lipps, MD  ketoconazole (NIZORAL) 2 % cream APP AA ON SKIN BID 12/04/17   [provider]  ketoconazole (NIZORAL) 2 % shampoo  09/18/18   [provider]  omeprazole (PRILOSEC) 20 MG capsule Take 1 capsule (20 mg total) by mouth daily. 09/06/17 09/06/18  Flora Lipps, MD  PROAIR HFA 108 (90 Base) MCG/ACT inhaler INL 2 PFS PO Q 4 TO 6 H PRN 04/03/18   [provider]  SPIRIVA HANDIHALER 18 MCG inhalation capsule USE 1 [place] 04/08/18   [provider]    Allergies Patient has no known allergies.  Family Hx Family History  Problem Relation Age of Onset  . Lung cancer Mother   . Heart disease Father   . Prostate cancer Maternal Uncle     Social Hx Social History   Tobacco Use  . Smoking status: Former Smoker    Packs/day: 0.25    Years: 30.00    Pack years: 7.50    Types: Cigarettes    Quit date: 02/19/1994    Years since quitting: 25.0  . Smokeless tobacco: Never Used  Substance Use Topics  . Alcohol use: No  . Drug use: No     Review of Systems  Constitutional: Negative for fever, chills. Eyes: Negative for visual changes. ENT: Negative for sore throat. Cardiovascular: Negative for chest pain. Respiratory:  Negative for shortness of breath. Gastrointestinal: Negative for nausea, vomiting.  Genitourinary: Negative for dysuria. Musculoskeletal: Negative for leg swelling. Skin: Negative for rash. Neurological: Positive for headaches.   Physical Exam  Vital Signs: ED Triage Vitals  Enc Vitals Group     BP 03/18/19 1505 (!) 141/68     Pulse Rate 03/18/19 1505 69     Resp 03/18/19 1505 16     Temp 03/18/19 1505 97.7 F (36.5 C)     Temp Source 03/18/19 1505 Oral     SpO2 03/18/19 1505 96 %     Weight 03/18/19 1507 280 lb (127 kg)     Height 03/18/19 1507 '6\' 1"'  (1.854 m)     Head Circumference --      Peak Flow --      Pain Score 03/18/19 1506 4     Pain Loc --      Pain Edu? --      Excl. in [place]? --  Constitutional: Alert and oriented.  Head: Normocephalic. Atraumatic.  Hyperesthesias to palpation in the area superior and posterior lateral to the left ear.  No skin lesions or rash.  No erythema or induration or fluctuance.  Non-tender to palpation over the temporal area.  No temporal artery beading. No step offs or deformities.  Ears: No displacement of the ear or pinna.  No mastoid tenderness, swelling, or erythema.  Bilateral EAC unremarkable.  Bilateral TM with normal light reflex, no appreciable posterior effusions. Eyes: Conjunctivae clear. Sclera anicteric.  PERRL. Nose: No congestion. No rhinorrhea. Mouth/Throat: Wearing mask.  Neck: No stridor.   Cardiovascular: Normal rate, regular rhythm. Extremities well perfused. Respiratory: Normal respiratory effort.  Lungs CTAB. Gastrointestinal: Soft. Non-tender. Non-distended.  Musculoskeletal: No lower extremity edema. No deformities. Neurologic:  Normal speech and language. No gross focal neurologic deficits are appreciated. Face symmetric. CN II-XII in tact. BUE and BLE strength equal and symmetric. SILT.  Skin: Skin is warm, dry and intact. No rash noted.  No vesicles, blisters.  No erythema, induration. Psychiatric: Mood and  affect are appropriate for situation.    Procedures  Procedure(s) performed (including critical care):  Procedures   Initial Impression / Assessment and Plan / ED Course  74 y.o. male who presents to the ED for left sided head/scalp pain, as above.  Ddx: early shingles, trigeminal neuralgia (though does not shoot forward into the facial area), less likely GCA/temporal arteritis as he has no associated visual symptoms, no tenderness to the temporal area, no temporal artery beading. Less likely SAH given not thunderclap or worst at onset, gradual in description, and pain seems to localize externally. No trauma suggestive of ICH. No recent illness to suggest labyrinthitis. No evidence of infection on exam.  No associated vertigo suggestive of Mnire's.  Will obtain basic labs, including inflammatory markers. Patient would not like any pain medication at this time.   Labs without actionable derangements.  ESR within normal limits, therefore very unlikely to be GCA/temporal arteritis.  We will plan for discharge with Rx for Valtrex given concern for possible early shingles. Patient comfortable w/ this plan. Advised PCP follow up and given return precautions.    Final Clinical Impression(s) / ED Diagnosis  Final diagnoses:  Left-sided headache  Scalp pain       Note:  This document was prepared using Dragon voice recognition software and may include unintentional dictation errors.   Lilia Pro., MD 03/18/19 [phone]

## 2019-04-16 ENCOUNTER — Ambulatory Visit (INDEPENDENT_AMBULATORY_CARE_PROVIDER_SITE_OTHER): Payer: Medicare Other | Admitting: Surgery

## 2019-04-16 ENCOUNTER — Encounter: Payer: Self-pay | Admitting: Oncology

## 2019-04-16 ENCOUNTER — Encounter: Payer: Self-pay | Admitting: Surgery

## 2019-04-16 ENCOUNTER — Telehealth: Payer: Self-pay | Admitting: Surgery

## 2019-04-16 ENCOUNTER — Ambulatory Visit: Payer: Self-pay | Admitting: Surgery

## 2019-04-16 ENCOUNTER — Other Ambulatory Visit: Payer: Self-pay

## 2019-04-16 VITALS — BP 135/86 | HR 63 | Temp 98.8°F | Resp 14 | Ht 73.5 in | Wt 301.6 lb

## 2019-04-16 DIAGNOSIS — K429 Umbilical hernia without obstruction or gangrene: Secondary | ICD-10-CM

## 2019-04-16 DIAGNOSIS — Z6839 Body mass index (BMI) 39.0-39.9, adult: Secondary | ICD-10-CM | POA: Diagnosis not present

## 2019-04-16 NOTE — Progress Notes (Signed)
Patient ID: Zachary Sweeney, male   DOB: 1945-08-29, 74 y.o.   MRN: [id number]  Chief Complaint: Periumbilical pain.  History of Present Illness Zachary Sweeney is a 74 y.o. male with with complaint of epigastric and umbilical pain.  He reports it is made worse by bending.  He denies any bulging or swelling in the region.  He reports normal bowel activities.  Denies nausea vomiting, fevers and chills.  He reports his pain level is 8 out of 10 and it only exacerbated when bending at the waist.  He reports some nausea and is currently taking Eliquis for history of DVT in his distal right leg. Of note he has a CT scan from February 2, [id number] indicating umbilical hernia on report.  Past Medical History Past Medical History:  Diagnosis Date  . Acid reflux   . Back injury   . Colon polyp   . DVT (deep venous thrombosis) ([place])    a. 06/2017 LE U/S: Right popliteal vein DVT.  Marland Kitchen Dyspnea on exertion    a. 06/2017 CTA Chest: No PE. No significant coronary Ca2+, centrilobular and paraseptal emphysema;  b.  07/2017 Echo: EF 60-65%, no rwma, Gr1 DD, mildly dil LA. Nl RV fxn; c. 07/2017 MV: Small, severe defect involving apical inf and apical segments - only on rest images consistent w/ artifact. No ischemia or scar.  . Emphysema of lung ([place])    a. 06/2017 CTA Chest: Centrilobular and paraseptal emphysema, with mild geographic ground-glass opacity likely representing small airway dzs.  Marland Kitchen Hypertension   . Torn ligament       Past Surgical History:  Procedure Laterality Date  . COLONOSCOPY  2008  . COLONOSCOPY WITH PROPOFOL N/A 02/27/2017   Procedure: COLONOSCOPY WITH PROPOFOL;  Surgeon: Robert Bellow, MD;  Location: [facility] ENDOSCOPY;  Service: Endoscopy;  Laterality: N/A;  . COLONOSCOPY WITH PROPOFOL N/A 07/24/2018   Procedure: COLONOSCOPY WITH PROPOFOL;  Surgeon: Virgel Manifold, MD;  Location: [facility] ENDOSCOPY;  Service: Endoscopy;  Laterality: N/A;  . ESOPHAGOGASTRODUODENOSCOPY (EGD) WITH PROPOFOL N/A  02/27/2017   Procedure: ESOPHAGOGASTRODUODENOSCOPY (EGD) WITH PROPOFOL;  Surgeon: Robert Bellow, MD;  Location: [facility] ENDOSCOPY;  Service: Endoscopy;  Laterality: N/A;  . ESOPHAGOGASTRODUODENOSCOPY (EGD) WITH PROPOFOL N/A 07/24/2018   Procedure: ESOPHAGOGASTRODUODENOSCOPY (EGD) WITH PROPOFOL;  Surgeon: Virgel Manifold, MD;  Location: [facility] ENDOSCOPY;  Service: Endoscopy;  Laterality: N/A;  . HERNIA REPAIR    . KNEE SURGERY Left   . NECK SURGERY    . ROTATOR CUFF REPAIR Right     No Known Allergies  Current Outpatient Medications  Medication Sig Dispense Refill  . apixaban (ELIQUIS) 2.5 MG TABS tablet Take 1 tablet (2.5 mg total) by mouth 2 (two) times daily. 180 tablet 1  . clotrimazole (LOTRIMIN) 1 % cream     . diltiazem (CARDIZEM CD) 300 MG 24 hr capsule Take 1 capsule (300 mg total) by mouth daily. 90 capsule 3  . ketoconazole (NIZORAL) 2 % cream APP AA ON SKIN BID    . ketoconazole (NIZORAL) 2 % shampoo     . omeprazole (PRILOSEC) 20 MG capsule Take 1 capsule (20 mg total) by mouth daily. 30 capsule 6   No current facility-administered medications for this visit.    Family History Family History  Problem Relation Age of Onset  . Lung cancer Mother   . Heart disease Father   . Prostate cancer Maternal Uncle       Social History Social History   Tobacco  Use  . Smoking status: Former Smoker    Packs/day: 0.25    Years: 30.00    Pack years: 7.50    Types: Cigarettes    Quit date: 02/19/1994    Years since quitting: 25.1  . Smokeless tobacco: Never Used  Substance Use Topics  . Alcohol use: No  . Drug use: No        Review of Systems  Constitutional: Negative.   HENT: Negative.   Eyes: Negative.   Respiratory: Negative.   Cardiovascular: Negative.   Gastrointestinal: Negative.   Skin: Negative.   Neurological: Negative.   Endo/Heme/Allergies: Negative.       Physical Exam Blood pressure 135/86, pulse 63, temperature 98.8 F (37.1 C), resp. rate 14,  height 6' 1.5" (1.867 m), weight (!) 301 lb 9.6 oz (136.8 kg), SpO2 95 %. Last Weight  Most recent update: 04/16/2019 10:11 AM   Weight  136.8 kg (301 lb 9.6 oz)              CONSTITUTIONAL: Well developed, and nourished, appropriately responsive and aware without distress.  Morbidly obese, complains/groans with arising from recumbent position. EYES: Sclera non-icteric.   EARS, NOSE, MOUTH AND THROAT: Mask worn.   Hearing is intact to voice.  NECK: Trachea is midline, and there is no jugular venous distension.  LYMPH NODES:  Lymph nodes in the neck are not enlarged. RESPIRATORY:  Lungs are clear, and breath sounds are equal bilaterally. Normal respiratory effort without pathologic use of accessory muscles. CARDIOVASCULAR: Heart is regular in rate and rhythm. GI: His umbilical skin is flush with remainder of his abdominal skin, suggestive of an umbilical hernia.  There is a slight bulge to the right which is tender, this does not change with changes in position or cough.  He does have diastases recti of the epigastrium without hernia.  The abdomen is otherwise soft, nontender, and nondistended. There were no palpable masses. MUSCULOSKELETAL:  Symmetrical muscle tone appreciated in all four extremities.    SKIN: Skin turgor is normal. No pathologic skin lesions appreciated.  NEUROLOGIC:  Motor and sensation appear grossly normal.  Cranial nerves are grossly without defect. PSYCH:  Alert and oriented to person, place and time. Affect is appropriate for situation.  Data Reviewed I have personally reviewed what is currently available of the patient's imaging, recent labs and medical records.   Labs:  CBC Latest Ref Rng & Units 03/18/2019 10/20/2018 07/01/2018  WBC 4.0 - 10.5 K/uL 8.3 6.4 7.8  Hemoglobin 13.0 - 17.0 g/dL 17.2(H) 16.4 17.0  Hematocrit 39.0 - 52.0 % 50.9 48.0 49.4  Platelets 150 - 400 K/uL 150 141(L) 153   CMP Latest Ref Rng & Units 03/18/2019 10/20/2018 07/01/2018  Glucose 70 - 99  mg/dL 97 104(H) 96  BUN 8 - 23 mg/dL 13 14 15   Creatinine 0.61 - 1.24 mg/dL 1.27(H) 1.26(H) 1.22  Sodium 135 - 145 mmol/L 137 138 136  Potassium 3.5 - 5.1 mmol/L 4.2 3.9 4.0  Chloride 98 - 111 mmol/L 105 107 105  CO2 22 - 32 mmol/L 25 24 25   Calcium 8.9 - 10.3 mg/dL 9.4 9.2 9.3  Total Protein 6.5 - 8.1 g/dL - 7.7 7.7  Total Bilirubin 0.3 - 1.2 mg/dL - 1.2 1.0  Alkaline Phos 38 - 126 U/L - 45 56  AST 15 - 41 U/L - 24 23  ALT 0 - 44 U/L - 20 19      Imaging: Radiology review: Prior CT scan noted.  It  appears to me that this is primarily eventration of the umbilical region and will continue already with the epigastric diastases recti.  However on clinical exam today there appears there must be a small fascial defect that is causing some tenderness with likely incarcerated preperitoneal fat. Within last 24 hrs: No results found.  Assessment    Small symptomatic umbilical hernia, associated with prior umbilical eventration and diastases recti. Patient Active Problem List   Diagnosis Date Noted  . Positive fecal occult blood test   . Polyp of colon   . Diverticulosis of large intestine without diverticulitis   . CLE (columnar lined esophagus)   . Hiatal hernia   . DOE (dyspnea on exertion) 07/19/2017  . Atypical chest pain 07/19/2017  . Deep vein thrombosis (DVT) of popliteal vein of right lower extremity ([place]) 07/19/2017  . Benign prostatic hyperplasia 06/25/2017  . Essential hypertension 06/25/2017  . Esophageal dysphagia 01/31/2017  . Epigastric pain 01/31/2017  . Obesity, unspecified 06/26/2013    Plan    I believe local exploration with suture repair of the small defect to be sufficient for this patient's symptoms.  I feel it is unwarranted to pursue more formal repair considering the extent of his diastases and the lack of a true umbilical hernia at this region.  Also consider his comorbidities as well as the way his symptoms present.  This is explained to him in detail  including likely 50% chance of recurrence depending upon the type of tissues we find to reapproximate.  He agrees and desires to proceed.  I discussed possibility of incarceration, the unlikelihood of strangulation, enlargement in size over time, and the risk of emergency surgery.  Also discussed the risk of surgery including recurrence which can be up to 30% in the case of complex hernias, use of prosthetic materials (mesh) and the increased risk of infxn, post-op infxn and the possible need for re-operation and removal of mesh if used, possibility of post-op SBO or ileus, and the risks of general anesthetic including MI, CVA, sudden death or even reaction to anesthetic medications. The patient understands the risks, any and all questions were answered to the patient's satisfaction.  Face-to-face time spent with the patient and accompanying care providers(if present) was 30 minutes, with more than 50% of the time spent counseling, educating, and coordinating care of the patient.      Ronny Bacon M.D., FACS 04/16/2019, 12:54 PM

## 2019-04-16 NOTE — Telephone Encounter (Signed)
Pt has been advised of pre admission date/time, Covid Testing date and Surgery date.  Surgery Date: 04/22/19 Preadmission Testing Date: 04/17/19 (8a-1p) Covid Testing Date: 04/20/19 - patient advised to go to the [facility] ([place])  Patient has been made aware to call [phone], between 1-3:00pm the day before surgery, to find out what time to arrive.

## 2019-04-16 NOTE — Patient Instructions (Addendum)
Our surgery scheduler will contact you to schedule your surgery.  Please have the blue sheet available when she calls you.   STOP TAKING YOUR ELIQUIS TWO DAYS PRIOR TO YOUR SURGERY.   Umbilical Hernia, Adult  A hernia is a bulge of tissue that pushes through an opening between muscles. An umbilical hernia happens in the abdomen, near the belly button (umbilicus). The hernia may contain tissues from the small intestine, large intestine, or fatty tissue covering the intestines (omentum). Umbilical hernias in adults tend to get worse over time, and they require surgical treatment. There are several types of umbilical hernias. You may have:  A hernia located just above or below the umbilicus (indirect hernia). This is the most common type of umbilical hernia in adults.  A hernia that forms through an opening formed by the umbilicus (direct hernia).  A hernia that comes and goes (reducible hernia). A reducible hernia may be visible only when you strain, lift something heavy, or cough. This type of hernia can be pushed back into the abdomen (reduced).  A hernia that traps abdominal tissue inside the hernia (incarcerated hernia). This type of hernia cannot be reduced.  A hernia that cuts off blood flow to the tissues inside the hernia (strangulated hernia). The tissues can start to die if this happens. This type of hernia requires emergency treatment. What are the causes? An umbilical hernia happens when tissue inside the abdomen presses on a weak area of the abdominal muscles. What increases the risk? You may have a greater risk of this condition if you:  Are obese.  Have had several pregnancies.  Have a buildup of fluid inside your abdomen (ascites).  Have had surgery that weakens the abdominal muscles. What are the signs or symptoms? The main symptom of this condition is a painless bulge at or near the belly button. A reducible hernia may be visible only when you strain, lift something  heavy, or cough. Other symptoms may include:  Dull pain.  A feeling of pressure. Symptoms of a strangulated hernia may include:  Pain that gets increasingly worse.  Nausea and vomiting.  Pain when pressing on the hernia.  Skin over the hernia becoming red or purple.  Constipation.  Blood in the stool. How is this diagnosed? This condition may be diagnosed based on:  A physical exam. You may be asked to cough or strain while standing. These actions increase the pressure inside your abdomen and force the hernia through the opening in your muscles. Your health care provider may try to reduce the hernia by pressing on it.  Your symptoms and medical history. How is this treated? Surgery is the only treatment for an umbilical hernia. Surgery for a strangulated hernia is done as soon as possible. If you have a small hernia that is not incarcerated, you may need to lose weight before having surgery. Follow these instructions at home:  Lose weight, if told by your health care provider.  Do not try to push the hernia back in.  Watch your hernia for any changes in color or size. Tell your health care provider if any changes occur.  You may need to avoid activities that increase pressure on your hernia.  Do not lift anything that is heavier than 10 lb (4.5 kg) until your health care provider says that this is safe.  Take over-the-counter and prescription medicines only as told by your health care provider.  Keep all follow-up visits as told by your health care provider. This  is important. Contact a health care provider if:  Your hernia gets larger.  Your hernia becomes painful. Get help right away if:  You develop sudden, severe pain near the area of your hernia.  You have pain as well as nausea or vomiting.  You have pain and the skin over your hernia changes color.  You develop a fever. This information is not intended to replace advice given to you by your health care  provider. Make sure you discuss any questions you have with your health care provider. Document Revised: 03/20/2017 Document Reviewed: 08/06/2016 Elsevier Patient Education  [place].

## 2019-04-16 NOTE — Progress Notes (Signed)
Pt contacted for follow up. No concerns voiced.Pt scheduled for hernia repair next week.

## 2019-04-16 NOTE — H&P (View-Only) (Signed)
Patient ID: Zachary Sweeney, male   DOB: Dec 17, 1945, 74 y.o.   MRN: [id number]  Chief Complaint: Periumbilical pain.  History of Present Illness Zachary Sweeney is a 74 y.o. male with with complaint of epigastric and umbilical pain.  He reports it is made worse by bending.  He denies any bulging or swelling in the region.  He reports normal bowel activities.  Denies nausea vomiting, fevers and chills.  He reports his pain level is 8 out of 10 and it only exacerbated when bending at the waist.  He reports some nausea and is currently taking Eliquis for history of DVT in his distal right leg. Of note he has a CT scan from February 2, [id number] indicating umbilical hernia on report.  Past Medical History Past Medical History:  Diagnosis Date  . Acid reflux   . Back injury   . Colon polyp   . DVT (deep venous thrombosis) ([place])    a. 06/2017 LE U/S: Right popliteal vein DVT.  Marland Kitchen Dyspnea on exertion    a. 06/2017 CTA Chest: No PE. No significant coronary Ca2+, centrilobular and paraseptal emphysema;  b.  07/2017 Echo: EF 60-65%, no rwma, Gr1 DD, mildly dil LA. Nl RV fxn; c. 07/2017 MV: Small, severe defect involving apical inf and apical segments - only on rest images consistent w/ artifact. No ischemia or scar.  . Emphysema of lung ([place])    a. 06/2017 CTA Chest: Centrilobular and paraseptal emphysema, with mild geographic ground-glass opacity likely representing small airway dzs.  Marland Kitchen Hypertension   . Torn ligament       Past Surgical History:  Procedure Laterality Date  . COLONOSCOPY  2008  . COLONOSCOPY WITH PROPOFOL N/A 02/27/2017   Procedure: COLONOSCOPY WITH PROPOFOL;  Surgeon: Robert Bellow, MD;  Location: [facility] ENDOSCOPY;  Service: Endoscopy;  Laterality: N/A;  . COLONOSCOPY WITH PROPOFOL N/A 07/24/2018   Procedure: COLONOSCOPY WITH PROPOFOL;  Surgeon: Virgel Manifold, MD;  Location: [facility] ENDOSCOPY;  Service: Endoscopy;  Laterality: N/A;  . ESOPHAGOGASTRODUODENOSCOPY (EGD) WITH PROPOFOL N/A  02/27/2017   Procedure: ESOPHAGOGASTRODUODENOSCOPY (EGD) WITH PROPOFOL;  Surgeon: Robert Bellow, MD;  Location: [facility] ENDOSCOPY;  Service: Endoscopy;  Laterality: N/A;  . ESOPHAGOGASTRODUODENOSCOPY (EGD) WITH PROPOFOL N/A 07/24/2018   Procedure: ESOPHAGOGASTRODUODENOSCOPY (EGD) WITH PROPOFOL;  Surgeon: Virgel Manifold, MD;  Location: [facility] ENDOSCOPY;  Service: Endoscopy;  Laterality: N/A;  . HERNIA REPAIR    . KNEE SURGERY Left   . NECK SURGERY    . ROTATOR CUFF REPAIR Right     No Known Allergies  Current Outpatient Medications  Medication Sig Dispense Refill  . apixaban (ELIQUIS) 2.5 MG TABS tablet Take 1 tablet (2.5 mg total) by mouth 2 (two) times daily. 180 tablet 1  . clotrimazole (LOTRIMIN) 1 % cream     . diltiazem (CARDIZEM CD) 300 MG 24 hr capsule Take 1 capsule (300 mg total) by mouth daily. 90 capsule 3  . ketoconazole (NIZORAL) 2 % cream APP AA ON SKIN BID    . ketoconazole (NIZORAL) 2 % shampoo     . omeprazole (PRILOSEC) 20 MG capsule Take 1 capsule (20 mg total) by mouth daily. 30 capsule 6   No current facility-administered medications for this visit.    Family History Family History  Problem Relation Age of Onset  . Lung cancer Mother   . Heart disease Father   . Prostate cancer Maternal Uncle       Social History Social History   Tobacco  Use  . Smoking status: Former Smoker    Packs/day: 0.25    Years: 30.00    Pack years: 7.50    Types: Cigarettes    Quit date: 02/19/1994    Years since quitting: 25.1  . Smokeless tobacco: Never Used  Substance Use Topics  . Alcohol use: No  . Drug use: No        Review of Systems  Constitutional: Negative.   HENT: Negative.   Eyes: Negative.   Respiratory: Negative.   Cardiovascular: Negative.   Gastrointestinal: Negative.   Skin: Negative.   Neurological: Negative.   Endo/Heme/Allergies: Negative.       Physical Exam Blood pressure 135/86, pulse 63, temperature 98.8 F (37.1 C), resp. rate 14,  height 6' 1.5" (1.867 m), weight (!) 301 lb 9.6 oz (136.8 kg), SpO2 95 %. Last Weight  Most recent update: 04/16/2019 10:11 AM   Weight  136.8 kg (301 lb 9.6 oz)              CONSTITUTIONAL: Well developed, and nourished, appropriately responsive and aware without distress.  Morbidly obese, complains/groans with arising from recumbent position. EYES: Sclera non-icteric.   EARS, NOSE, MOUTH AND THROAT: Mask worn.   Hearing is intact to voice.  NECK: Trachea is midline, and there is no jugular venous distension.  LYMPH NODES:  Lymph nodes in the neck are not enlarged. RESPIRATORY:  Lungs are clear, and breath sounds are equal bilaterally. Normal respiratory effort without pathologic use of accessory muscles. CARDIOVASCULAR: Heart is regular in rate and rhythm. GI: His umbilical skin is flush with remainder of his abdominal skin, suggestive of an umbilical hernia.  There is a slight bulge to the right which is tender, this does not change with changes in position or cough.  He does have diastases recti of the epigastrium without hernia.  The abdomen is otherwise soft, nontender, and nondistended. There were no palpable masses. MUSCULOSKELETAL:  Symmetrical muscle tone appreciated in all four extremities.    SKIN: Skin turgor is normal. No pathologic skin lesions appreciated.  NEUROLOGIC:  Motor and sensation appear grossly normal.  Cranial nerves are grossly without defect. PSYCH:  Alert and oriented to person, place and time. Affect is appropriate for situation.  Data Reviewed I have personally reviewed what is currently available of the patient's imaging, recent labs and medical records.   Labs:  CBC Latest Ref Rng & Units 03/18/2019 10/20/2018 07/01/2018  WBC 4.0 - 10.5 K/uL 8.3 6.4 7.8  Hemoglobin 13.0 - 17.0 g/dL 17.2(H) 16.4 17.0  Hematocrit 39.0 - 52.0 % 50.9 48.0 49.4  Platelets 150 - 400 K/uL 150 141(L) 153   CMP Latest Ref Rng & Units 03/18/2019 10/20/2018 07/01/2018  Glucose 70 - 99  mg/dL 97 104(H) 96  BUN 8 - 23 mg/dL 13 14 15   Creatinine 0.61 - 1.24 mg/dL 1.27(H) 1.26(H) 1.22  Sodium 135 - 145 mmol/L 137 138 136  Potassium 3.5 - 5.1 mmol/L 4.2 3.9 4.0  Chloride 98 - 111 mmol/L 105 107 105  CO2 22 - 32 mmol/L 25 24 25   Calcium 8.9 - 10.3 mg/dL 9.4 9.2 9.3  Total Protein 6.5 - 8.1 g/dL - 7.7 7.7  Total Bilirubin 0.3 - 1.2 mg/dL - 1.2 1.0  Alkaline Phos 38 - 126 U/L - 45 56  AST 15 - 41 U/L - 24 23  ALT 0 - 44 U/L - 20 19      Imaging: Radiology review: Prior CT scan noted.  It  appears to me that this is primarily eventration of the umbilical region and will continue already with the epigastric diastases recti.  However on clinical exam today there appears there must be a small fascial defect that is causing some tenderness with likely incarcerated preperitoneal fat. Within last 24 hrs: No results found.  Assessment    Small symptomatic umbilical hernia, associated with prior umbilical eventration and diastases recti. Patient Active Problem List   Diagnosis Date Noted  . Positive fecal occult blood test   . Polyp of colon   . Diverticulosis of large intestine without diverticulitis   . CLE (columnar lined esophagus)   . Hiatal hernia   . DOE (dyspnea on exertion) 07/19/2017  . Atypical chest pain 07/19/2017  . Deep vein thrombosis (DVT) of popliteal vein of right lower extremity ([place]) 07/19/2017  . Benign prostatic hyperplasia 06/25/2017  . Essential hypertension 06/25/2017  . Esophageal dysphagia 01/31/2017  . Epigastric pain 01/31/2017  . Obesity, unspecified 06/26/2013    Plan    I believe local exploration with suture repair of the small defect to be sufficient for this patient's symptoms.  I feel it is unwarranted to pursue more formal repair considering the extent of his diastases and the lack of a true umbilical hernia at this region.  Also consider his comorbidities as well as the way his symptoms present.  This is explained to him in detail  including likely 50% chance of recurrence depending upon the type of tissues we find to reapproximate.  He agrees and desires to proceed.  I discussed possibility of incarceration, the unlikelihood of strangulation, enlargement in size over time, and the risk of emergency surgery.  Also discussed the risk of surgery including recurrence which can be up to 30% in the case of complex hernias, use of prosthetic materials (mesh) and the increased risk of infxn, post-op infxn and the possible need for re-operation and removal of mesh if used, possibility of post-op SBO or ileus, and the risks of general anesthetic including MI, CVA, sudden death or even reaction to anesthetic medications. The patient understands the risks, any and all questions were answered to the patient's satisfaction.  Face-to-face time spent with the patient and accompanying care providers(if present) was 30 minutes, with more than 50% of the time spent counseling, educating, and coordinating care of the patient.      Ronny Bacon M.D., FACS 04/16/2019, 12:54 PM

## 2019-04-17 ENCOUNTER — Other Ambulatory Visit: Payer: Self-pay

## 2019-04-17 ENCOUNTER — Inpatient Hospital Stay (HOSPITAL_BASED_OUTPATIENT_CLINIC_OR_DEPARTMENT_OTHER): Payer: Medicare Other | Admitting: Oncology

## 2019-04-17 ENCOUNTER — Encounter
Admission: RE | Admit: 2019-04-17 | Discharge: 2019-04-17 | Disposition: A | Discharge status: Home / Self Care | Payer: Medicare Other | Source: Ambulatory Visit | Attending: Surgery | Admitting: Surgery

## 2019-04-17 ENCOUNTER — Inpatient Hospital Stay: Payer: Medicare Other | Attending: Oncology

## 2019-04-17 ENCOUNTER — Encounter: Payer: Self-pay | Admitting: Oncology

## 2019-04-17 VITALS — BP 168/100 | HR 67 | Temp 95.5°F | Resp 16 | Wt 295.6 lb

## 2019-04-17 DIAGNOSIS — Z01810 Encounter for preprocedural cardiovascular examination: Secondary | ICD-10-CM | POA: Insufficient documentation

## 2019-04-17 DIAGNOSIS — Z7901 Long term (current) use of anticoagulants: Secondary | ICD-10-CM | POA: Insufficient documentation

## 2019-04-17 DIAGNOSIS — Z79899 Other long term (current) drug therapy: Secondary | ICD-10-CM | POA: Insufficient documentation

## 2019-04-17 DIAGNOSIS — G4730 Sleep apnea, unspecified: Secondary | ICD-10-CM | POA: Diagnosis not present

## 2019-04-17 DIAGNOSIS — Z86718 Personal history of other venous thrombosis and embolism: Secondary | ICD-10-CM | POA: Diagnosis not present

## 2019-04-17 DIAGNOSIS — Z01812 Encounter for preprocedural laboratory examination: Secondary | ICD-10-CM | POA: Insufficient documentation

## 2019-04-17 DIAGNOSIS — Z20822 Contact with and (suspected) exposure to covid-19: Secondary | ICD-10-CM | POA: Insufficient documentation

## 2019-04-17 DIAGNOSIS — Z801 Family history of malignant neoplasm of trachea, bronchus and lung: Secondary | ICD-10-CM | POA: Diagnosis not present

## 2019-04-17 DIAGNOSIS — I10 Essential (primary) hypertension: Secondary | ICD-10-CM | POA: Diagnosis not present

## 2019-04-17 DIAGNOSIS — Z8249 Family history of ischemic heart disease and other diseases of the circulatory system: Secondary | ICD-10-CM | POA: Diagnosis not present

## 2019-04-17 DIAGNOSIS — E669 Obesity, unspecified: Secondary | ICD-10-CM | POA: Insufficient documentation

## 2019-04-17 DIAGNOSIS — Z8042 Family history of malignant neoplasm of prostate: Secondary | ICD-10-CM | POA: Diagnosis not present

## 2019-04-17 DIAGNOSIS — D751 Secondary polycythemia: Secondary | ICD-10-CM | POA: Insufficient documentation

## 2019-04-17 DIAGNOSIS — J439 Emphysema, unspecified: Secondary | ICD-10-CM | POA: Diagnosis not present

## 2019-04-17 DIAGNOSIS — Z87891 Personal history of nicotine dependence: Secondary | ICD-10-CM | POA: Diagnosis not present

## 2019-04-17 HISTORY — DX: Secondary polycythemia: D75.1

## 2019-04-17 HISTORY — DX: Sleep apnea, unspecified: G47.30

## 2019-04-17 LAB — CBC WITH DIFFERENTIAL/PLATELET
Abs Immature Granulocytes: 0.03 10*3/uL (ref 0.00–0.07)
Basophils Absolute: 0 10*3/uL (ref 0.0–0.1)
Basophils Relative: 0 %
Eosinophils Absolute: 0.2 10*3/uL (ref 0.0–0.5)
Eosinophils Relative: 2 %
HCT: 52.7 % — ABNORMAL HIGH (ref 39.0–52.0)
Hemoglobin: 17.7 g/dL — ABNORMAL HIGH (ref 13.0–17.0)
Immature Granulocytes: 0 %
Lymphocytes Relative: 31 %
Lymphs Abs: 2.1 10*3/uL (ref 0.7–4.0)
MCH: 29.9 pg (ref 26.0–34.0)
MCHC: 33.6 g/dL (ref 30.0–36.0)
MCV: 89.2 fL (ref 80.0–100.0)
Monocytes Absolute: 0.4 10*3/uL (ref 0.1–1.0)
Monocytes Relative: 6 %
Neutro Abs: 4.2 10*3/uL (ref 1.7–7.7)
Neutrophils Relative %: 61 %
Platelets: 165 10*3/uL (ref 150–400)
RBC: 5.91 MIL/uL — ABNORMAL HIGH (ref 4.22–5.81)
RDW: 13.5 % (ref 11.5–15.5)
WBC: 6.9 10*3/uL (ref 4.0–10.5)
nRBC: 0 % (ref 0.0–0.2)

## 2019-04-17 LAB — COMPREHENSIVE METABOLIC PANEL
ALT: 20 U/L (ref 0–44)
AST: 25 U/L (ref 15–41)
Albumin: 4.1 g/dL (ref 3.5–5.0)
Alkaline Phosphatase: 50 U/L (ref 38–126)
Anion gap: 10 (ref 5–15)
BUN: 10 mg/dL (ref 8–23)
CO2: 21 mmol/L — ABNORMAL LOW (ref 22–32)
Calcium: 9.2 mg/dL (ref 8.9–10.3)
Chloride: 106 mmol/L (ref 98–111)
Creatinine, Ser: 1.04 mg/dL (ref 0.61–1.24)
GFR calc Af Amer: >60 mL/min (ref >60)
GFR calc non Af Amer: >60 mL/min (ref >60)
Glucose, Bld: 119 mg/dL — ABNORMAL HIGH (ref 70–99)
Potassium: 3.6 mmol/L (ref 3.5–5.1)
Sodium: 137 mmol/L (ref 135–145)
Total Bilirubin: 1.3 mg/dL — ABNORMAL HIGH (ref 0.3–1.2)
Total Protein: 7.9 g/dL (ref 6.5–8.1)

## 2019-04-17 NOTE — Pre-Procedure Instructions (Signed)
Patient notified to come to the medical mall for EKg before coming for Covid test on 3/1

## 2019-04-17 NOTE — Progress Notes (Signed)
Hematology/Oncology follow-up progress note [facility] Telephone:([phone]) [phone] [phone]) [phone]   Patient Care Team: Gennette Pac, [place] as PCP - General (Family Medicine) End, Harrell Gave, MD as PCP - Cardiology (Cardiology)  REFERRING PROVIDER: Dr.Tahiliani Margretta Sidle CHIEF COMPLAINTS/REASON FOR VISIT:  Follow-up for DVT  HISTORY OF PRESENTING ILLNESS:  Zachary Sweeney is a  74 y.o.  male with PMH listed below who was referred to me for evaluation of DVT Patient recently established care with gastroenterology for consultation and management of chronic history of diffuse abdominal pain as well as epigastric pain.  Not related to meals, exacerbated when he bends down.  Not associated with nausea or vomiting. Denies any weight loss, fever, chills, night sweats.  Appetite is fair. Denies hematochezia, hematuria, hematemesis, epistaxis, black tarry stool or easy bruising.  Endoscopy procedures were recommended for further evaluation.  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  Patient was referred by gastroenterology to me for further evaluation and peri-procedure anticoagulation recommendation. Patient also was found to have an abdominal wall hernia which caused this pain with bending down.  He was also referred to see surgery Dr. Hampton Abbot for further evaluation.  06/25/2017, patient had US venous bilateral lower extremity which showed a focal area of partial compressibility popliteal Vein on the right, compatible with a partially occlusive DVT.  The borders are smooth and this has a subacute appearance. Left lower extremity no DVT.   Patient recalls that he had right foot pain and swelling, as the initial presentation which prompted ultrasound evaluation. Patient was started on Eliquis after ED discussed his case with vascular surgery Dr. Lucky Cowboy. Patient was taken off Eliquis in November 2019. Presented to [facility] emergency room on  03/30/2018 complaining right lower extremity pain and swelling again which is similar to when he had a blood clot.  Patient was concerned that he could have developed blood clots again so he restarted taking Eliquis for the past week prior to presenting to the emergency room.  He mentions to a friend at the church and they urged him to go to emergency room. In the ED, ultrasound showed nonocclusive right lower extremity DVT. 04/10/2018, he had CT abdomen pelvis done for evaluation of chronic abdominal pain.  CT showed no acute findings in the abdomen and pelvis.  Nonobstructive punctate stones over the lower pole collecting system of the right kidney.  Small umbilical hernia containing only peritoneal fat.  Aortic atherosclerosis.  Patient reports feeling well today.  Right lower extremity pain and swelling has improved.  He is taking Eliquis 5 mg twice daily since February 2020.  Tolerates well.  No bleeding events.  #  GI work-up done on 07/24/2018. Colonoscopy showed colon polyps which were resected and retrieved.  Diverticulosis.  Biopsy showed tubular adenoma.  Recommend repeat colonoscopy in 3 years negative for high-grade dysplasia and malignancy. EGD showed salmon-colored mucosa suggestive of Barrett's esophagus.  Biopsied.  Per GI note, biopsy showed evidence of acid reflux.  Patient was started on Pepcid.  #  07/10/2018 US venous lower extremity unilateral right findings compatible with resolving isolated right popliteal DVT. Recommend patient to be switched to Eliquis 2.5 mg twice daily for long-term anticoagulation maintenance.  INTERVAL HISTORY Zachary Sweeney is a 74 y.o. male who has above history reviewed by me today presents for follow up visit for management of DVT Problems and complaints are listed below: Patient has been on Eliquis 2.5mg  BID. No new complaints. Schedule for hernia repair next  week.  Denies hematochezia, hematuria, hematemesis, epistaxis, black tarry stool or easy  bruising.    Review of Systems  Constitutional: Negative for appetite change, chills, fatigue, fever and unexpected weight change.  HENT:   Negative for hearing loss and voice change.   Eyes: Negative for eye problems and icterus.  Respiratory: Negative for chest tightness, cough and shortness of breath.   Cardiovascular: Negative for chest pain and leg swelling.  Gastrointestinal: Negative for abdominal distention, abdominal pain, nausea and vomiting.  Endocrine: Negative for hot flashes.  Genitourinary: Negative for difficulty urinating, dysuria and frequency.   Musculoskeletal: Negative for arthralgias.  Skin: Negative for itching and rash.  Neurological: Negative for light-headedness and numbness.  Hematological: Negative for adenopathy. Does not bruise/bleed easily.  Psychiatric/Behavioral: Negative for confusion.    MEDICAL HISTORY:  Past Medical History:  Diagnosis Date  . Acid reflux   . Back injury   . Colon polyp   . DVT (deep venous thrombosis) ([place])    a. 06/2017 LE U/S: Right popliteal vein DVT.  Marland Kitchen Dyspnea on exertion    a. 06/2017 CTA Chest: No PE. No significant coronary Ca2+, centrilobular and paraseptal emphysema;  b.  07/2017 Echo: EF 60-65%, no rwma, Gr1 DD, mildly dil LA. Nl RV fxn; c. 07/2017 MV: Small, severe defect involving apical inf and apical segments - only on rest images consistent w/ artifact. No ischemia or scar.  . Emphysema of lung ([place])    a. 06/2017 CTA Chest: Centrilobular and paraseptal emphysema, with mild geographic ground-glass opacity likely representing small airway dzs.  Marland Kitchen Hypertension   . Sleep apnea   . Torn ligament     SURGICAL HISTORY: Past Surgical History:  Procedure Laterality Date  . COLONOSCOPY  2008  . COLONOSCOPY WITH PROPOFOL N/A 02/27/2017   Procedure: COLONOSCOPY WITH PROPOFOL;  Surgeon: Robert Bellow, MD;  Location: [facility] ENDOSCOPY;  Service: Endoscopy;  Laterality: N/A;  . COLONOSCOPY WITH PROPOFOL N/A 07/24/2018    Procedure: COLONOSCOPY WITH PROPOFOL;  Surgeon: Virgel Manifold, MD;  Location: [facility] ENDOSCOPY;  Service: Endoscopy;  Laterality: N/A;  . ESOPHAGOGASTRODUODENOSCOPY (EGD) WITH PROPOFOL N/A 02/27/2017   Procedure: ESOPHAGOGASTRODUODENOSCOPY (EGD) WITH PROPOFOL;  Surgeon: Robert Bellow, MD;  Location: [facility] ENDOSCOPY;  Service: Endoscopy;  Laterality: N/A;  . ESOPHAGOGASTRODUODENOSCOPY (EGD) WITH PROPOFOL N/A 07/24/2018   Procedure: ESOPHAGOGASTRODUODENOSCOPY (EGD) WITH PROPOFOL;  Surgeon: Virgel Manifold, MD;  Location: [facility] ENDOSCOPY;  Service: Endoscopy;  Laterality: N/A;  . HERNIA REPAIR Right    inguinal  . KNEE SURGERY Left   . NECK SURGERY    . ROTATOR CUFF REPAIR Right     SOCIAL HISTORY: Social History   Socioeconomic History  . Marital status: Single    Spouse name: Not on file  . Number of children: Not on file  . Years of education: Not on file  . Highest education level: Not on file  Occupational History  . Occupation: retired  Tobacco Use  . Smoking status: Former Smoker    Packs/day: 0.25    Years: 30.00    Pack years: 7.50    Types: Cigarettes    Quit date: 02/19/1994    Years since quitting: 25.1  . Smokeless tobacco: Never Used  Substance and Sexual Activity  . Alcohol use: No  . Drug use: No  . Sexual activity: Not on file  Other Topics Concern  . Not on file  Social History Narrative  . Not on file   Social Determinants of Health  Financial Resource Strain:   . Difficulty of Paying Living Expenses: Not on file  Food Insecurity:   . Worried About Charity fundraiser in the Last Year: Not on file  . Ran Out of Food in the Last Year: Not on file  Transportation Needs:   . Lack of Transportation (Medical): Not on file  . Lack of Transportation (Non-Medical): Not on file  Physical Activity:   . Days of Exercise per Week: Not on file  . Minutes of Exercise per Session: Not on file  Stress:   . Feeling of Stress : Not on file  Social  Connections:   . Frequency of Communication with Friends and Family: Not on file  . Frequency of Social Gatherings with Friends and Family: Not on file  . Attends Religious Services: Not on file  . Active Member of Clubs or Organizations: Not on file  . Attends Archivist Meetings: Not on file  . Marital Status: Not on file  Intimate Partner Violence:   . Fear of Current or Ex-Partner: Not on file  . Emotionally Abused: Not on file  . Physically Abused: Not on file  . Sexually Abused: Not on file    FAMILY HISTORY: Family History  Problem Relation Age of Onset  . Lung cancer Mother   . Heart disease Father   . Prostate cancer Maternal Uncle     ALLERGIES:  has No Known Allergies.  MEDICATIONS:  Current Outpatient Medications  Medication Sig Dispense Refill  . apixaban (ELIQUIS) 2.5 MG TABS tablet Take 1 tablet (2.5 mg total) by mouth 2 (two) times daily. 180 tablet 1  . clotrimazole (LOTRIMIN) 1 % cream Apply 1 application topically daily.     Marland Kitchen diltiazem (CARDIZEM CD) 300 MG 24 hr capsule Take 1 capsule (300 mg total) by mouth daily. 90 capsule 3  . ketoconazole (NIZORAL) 2 % cream Apply 1 application topically 2 (two) times daily as needed for irritation.     Marland Kitchen ketoconazole (NIZORAL) 2 % shampoo Apply 1 application topically once a week.     Marland Kitchen omeprazole (PRILOSEC) 20 MG capsule Take 1 capsule (20 mg total) by mouth daily. 30 capsule 6   No current facility-administered medications for this visit.     PHYSICAL EXAMINATION: ECOG PERFORMANCE STATUS: 0 - Asymptomatic Vitals:   04/17/19 1003  BP: (!) 168/100  Pulse: 67  Resp: 16  Temp: (!) 95.5 F (35.3 C)  SpO2: 96%   Filed Weights   04/17/19 1003  Weight: 295 lb 9.6 oz (134.1 kg)    Physical Exam Constitutional:      General: He is not in acute distress. HENT:     Head: Normocephalic and atraumatic.  Eyes:     General: No scleral icterus.    Pupils: Pupils are equal, round, and reactive to light.   Cardiovascular:     Rate and Rhythm: Normal rate and regular rhythm.     Heart sounds: Normal heart sounds.  Pulmonary:     Effort: Pulmonary effort is normal. No respiratory distress.     Breath sounds: No wheezing.  Abdominal:     General: Bowel sounds are normal. There is no distension.     Palpations: Abdomen is soft. There is no mass.     Tenderness: There is no abdominal tenderness.  Musculoskeletal:        General: No deformity. Normal range of motion.     Cervical back: Normal range of motion and neck supple.  Skin:    General: Skin is warm and dry.     Findings: No erythema or rash.  Neurological:     Mental Status: He is alert and oriented to person, place, and time.     Cranial Nerves: No cranial nerve deficit.     Coordination: Coordination normal.  Psychiatric:        Behavior: Behavior normal.        Thought Content: Thought content normal.     RADIOGRAPHIC STUDIES: I have personally reviewed the radiological images as listed and agreed with the findings in the report.  CMP Latest Ref Rng & Units 04/17/2019  Glucose 70 - 99 mg/dL 119(H)  BUN 8 - 23 mg/dL 10  Creatinine 0.61 - 1.24 mg/dL 1.04  Sodium 135 - 145 mmol/L 137  Potassium 3.5 - 5.1 mmol/L 3.6  Chloride 98 - 111 mmol/L 106  CO2 22 - 32 mmol/L 21(L)  Calcium 8.9 - 10.3 mg/dL 9.2  Total Protein 6.5 - 8.1 g/dL 7.9  Total Bilirubin 0.3 - 1.2 mg/dL 1.3(H)  Alkaline Phos 38 - 126 U/L 50  AST 15 - 41 U/L 25  ALT 0 - 44 U/L 20   CBC Latest Ref Rng & Units 04/17/2019  WBC 4.0 - 10.5 K/uL 6.9  Hemoglobin 13.0 - 17.0 g/dL 17.7(H)  Hematocrit 39.0 - 52.0 % 52.7(H)  Platelets 150 - 400 K/uL 165    LABORATORY DATA:  I have reviewed the data as listed Lab Results  Component Value Date   WBC 6.9 04/17/2019   HGB 17.7 (H) 04/17/2019   HCT 52.7 (H) 04/17/2019   MCV 89.2 04/17/2019   PLT 165 04/17/2019   Recent Labs    07/01/18 1336 07/01/18 1336 10/20/18 0939 03/18/19 1526 04/17/19 0941  NA 136    < > 138 137 137  K 4.0   < > 3.9 4.2 3.6  CL 105   < > 107 105 106  CO2 25   < > 24 25 21*  GLUCOSE 96   < > 104* 97 119*  BUN 15   < > 14 13 10   CREATININE 1.22   < > 1.26* 1.27* 1.04  CALCIUM 9.3   < > 9.2 9.4 9.2  GFRNONAA 60*   < > 57* 56* >60  GFRAA >60   < > >60 >60 >60  PROT 7.7  --  7.7  --  7.9  ALBUMIN 4.0  --  4.2  --  4.1  AST 23  --  24  --  25  ALT 19  --  20  --  20  ALKPHOS 56  --  45  --  50  BILITOT 1.0  --  1.2  --  1.3*   < > = values in this interval not displayed.   Iron/TIBC/Ferritin/ %Sat No results found for: IRON, TIBC, FERRITIN, IRONPCTSAT      ASSESSMENT & PLAN:  1. History of DVT of lower extremity   2. Chronic anticoagulation   3. Erythrocytosis    #History of recurrent DVT. Labs are reviewed and discussed with patient. Continue Eliquis 2.5 mg twice daily for maintenance. For elective procedure, recommend to hold Eliquis 2 days prior to the surgery and resume 24 -48 hours after procedure.  #Erythrocytosis, Hemoglobin trends up to 17.7, hematocrit 52.7. Patient has history of sleep apnea and he is not currently on CPAP machine.  He feels the CPAP machine makes his sinus dry and he often has headache after using  CPAP machine. Former smoker, emphysema, patient admits not using inhalers as recommended.  Importance of medication compliance was discussed with patient.  Discussed with patient that I think erythrocytosis most likely secondary process, due to intermittent hypoxia states sleep apnea and encourage patient to resume CPAP.  Encourage patient to discuss his concerns with primary care provider. Since hematocrit is above 50, and patient is reluctant in resuming CPAP.  He agrees with proceed with phlebotomy 300 cc x 1 to temporarily lower hematocrit and hemoglobin level.  He can follow-up in 3 months    Orders Placed This Encounter  Procedures  . Comprehensive metabolic panel    Standing Status:   Future    Standing Expiration Date:    04/16/2020  . CBC with Differential/Platelet    Standing Status:   Future    Standing Expiration Date:   04/16/2020    All questions were answered. The patient knows to call the clinic with any problems questions or concerns.  Earlie Server, MD, PhD Hematology Oncology [facility] Pager- [phone] 04/17/2019

## 2019-04-17 NOTE — Patient Instructions (Signed)
Your procedure is scheduled on: Wed. 3/3 Report to Day Surgery. To find out your arrival time please call [phone] between 1PM - 3PM on Tues 3/2.  Remember: Instructions that are not followed completely may result in serious medical risk,  up to and including death, or upon the discretion of your surgeon and anesthesiologist your  surgery may need to be rescheduled.     _X__ 1. Do not eat food after midnight the night before your procedure.                 No gum chewing or hard candies. You may drink clear liquids up to 2 hours                 before you are scheduled to arrive for your surgery- DO not drink clear                 liquids within 2 hours of the start of your surgery.                 Clear Liquids include:  water, apple juice without pulp, clear Gatorade, G2 or                  Gatorade Zero (avoid Red/Purple/Blue), Black Coffee or Tea (Do not add                 anything to coffee or tea). _____2.   Complete the carbohydrate drink provided to you, 2 hours before arrival.  __X__2.  On the morning of surgery brush your teeth with toothpaste and water, you                may rinse your mouth with mouthwash if you wish.  Do not swallow any toothpaste of mouthwash.     ___ 3.  No Alcohol for 24 hours before or after surgery.   ___ 4.  Do Not Smoke or use e-cigarettes For 24 Hours Prior to Your Surgery.                 Do not use any chewable tobacco products for at least 6 hours prior to                 surgery.  ____  5.  Bring all medications with you on the day of surgery if instructed.   __x__  6.  Notify your doctor if there is any change in your medical condition      (cold, fever, infections).     Do not wear jewelry, make-up, hairpins, clips or nail polish. Do not wear lotions, powders, or perfumes. You may wear deodorant. Do not shave 48 hours prior to surgery. Men may shave face and neck. Do not bring valuables to the hospital.     [facility] is not responsible for any belongings or valuables.  Contacts, dentures or bridgework may not be worn into surgery. Leave your suitcase in the car. After surgery it may be brought to your room. For patients admitted to the hospital, discharge time is determined by your treatment team.   Patients discharged the day of surgery will not be allowed to drive home.   Make arrangements for someone to be with you for the first 24 hours of your Same Day Discharge.    Please read over the following fact sheets that you were given:     __x__ Take these medicines the morning of surgery with A SIP OF WATER:  1. diltiazem (CARDIZEM CD) 300 MG 24 hr capsule  2. omeprazole (PRILOSEC) 20 MG capsule  Dose the night before and one the morning of surgery  3.   4.  5.  6.  ____ Fleet Enema (as directed)   _x___ Use CHG Soap (or wipes) as directed  ____ Use Benzoyl Peroxide Gel as instructed  ____ Use inhalers on the day of surgery  ____ Stop metformin 2 days prior to surgery    ____ Take 1/2 of usual insulin dose the night before surgery. No insulin the morning          of surgery.   _x___ Stop apixaban (ELIQUIS) 2.5 MG TABS tablet on Mon 3/1  __x__ Stop Anti-inflammatories    No ibuprofen aleve or aspirin   ____ Stop supplements until after surgery.    _x___ use your C-Pap during the day on your surgery day. Continue use at night there after .

## 2019-04-20 ENCOUNTER — Other Ambulatory Visit: Payer: Self-pay

## 2019-04-20 ENCOUNTER — Other Ambulatory Visit: Payer: Medicare Other

## 2019-04-20 ENCOUNTER — Encounter
Admission: RE | Admit: 2019-04-20 | Discharge: 2019-04-20 | Disposition: A | Discharge status: Home / Self Care | Payer: Medicare Other | Source: Ambulatory Visit | Attending: Surgery | Admitting: Surgery

## 2019-04-20 DIAGNOSIS — Z01810 Encounter for preprocedural cardiovascular examination: Secondary | ICD-10-CM | POA: Diagnosis not present

## 2019-04-21 LAB — SARS CORONAVIRUS 2 (TAT 6-24 HRS): SARS Coronavirus 2: NEGATIVE

## 2019-04-21 MED ORDER — DEXTROSE 5 % IV SOLN
3.0000 g | INTRAVENOUS | Status: AC
  Administered 2019-04-22: 3 g via INTRAVENOUS
  Filled 2019-04-21: qty 3000

## 2019-04-22 ENCOUNTER — Other Ambulatory Visit: Payer: Self-pay

## 2019-04-22 ENCOUNTER — Ambulatory Visit
Admission: RE | Admit: 2019-04-22 | Discharge: 2019-04-22 | Disposition: A | Discharge status: Home / Self Care | Payer: Medicare Other | Attending: Surgery | Admitting: Surgery

## 2019-04-22 ENCOUNTER — Ambulatory Visit: Payer: Medicare Other | Admitting: Anesthesiology

## 2019-04-22 ENCOUNTER — Encounter: Payer: Self-pay | Admitting: Surgery

## 2019-04-22 ENCOUNTER — Encounter
Admission: RE | Disposition: A | Discharge status: Home / Self Care | Payer: Self-pay | Source: Home / Self Care | Attending: Surgery

## 2019-04-22 DIAGNOSIS — Z87891 Personal history of nicotine dependence: Secondary | ICD-10-CM | POA: Insufficient documentation

## 2019-04-22 DIAGNOSIS — K420 Umbilical hernia with obstruction, without gangrene: Secondary | ICD-10-CM | POA: Insufficient documentation

## 2019-04-22 DIAGNOSIS — Z8249 Family history of ischemic heart disease and other diseases of the circulatory system: Secondary | ICD-10-CM | POA: Insufficient documentation

## 2019-04-22 DIAGNOSIS — K219 Gastro-esophageal reflux disease without esophagitis: Secondary | ICD-10-CM | POA: Diagnosis not present

## 2019-04-22 DIAGNOSIS — Z86718 Personal history of other venous thrombosis and embolism: Secondary | ICD-10-CM | POA: Insufficient documentation

## 2019-04-22 DIAGNOSIS — Z79899 Other long term (current) drug therapy: Secondary | ICD-10-CM | POA: Diagnosis not present

## 2019-04-22 DIAGNOSIS — Z6839 Body mass index (BMI) 39.0-39.9, adult: Secondary | ICD-10-CM | POA: Diagnosis not present

## 2019-04-22 DIAGNOSIS — Z7901 Long term (current) use of anticoagulants: Secondary | ICD-10-CM | POA: Diagnosis not present

## 2019-04-22 DIAGNOSIS — E669 Obesity, unspecified: Secondary | ICD-10-CM | POA: Insufficient documentation

## 2019-04-22 DIAGNOSIS — K429 Umbilical hernia without obstruction or gangrene: Secondary | ICD-10-CM

## 2019-04-22 DIAGNOSIS — I10 Essential (primary) hypertension: Secondary | ICD-10-CM | POA: Diagnosis not present

## 2019-04-22 DIAGNOSIS — G4730 Sleep apnea, unspecified: Secondary | ICD-10-CM | POA: Diagnosis not present

## 2019-04-22 DIAGNOSIS — J439 Emphysema, unspecified: Secondary | ICD-10-CM | POA: Diagnosis not present

## 2019-04-22 HISTORY — PX: UMBILICAL HERNIA REPAIR: SHX196

## 2019-04-22 SURGERY — REPAIR, HERNIA, UMBILICAL, ADULT
Anesthesia: General

## 2019-04-22 MED ORDER — FENTANYL CITRATE (PF) 100 MCG/2ML IJ SOLN
INTRAMUSCULAR | Status: AC
  Administered 2019-04-22: 25 ug via INTRAVENOUS
  Filled 2019-04-22: qty 2

## 2019-04-22 MED ORDER — FENTANYL CITRATE (PF) 100 MCG/2ML IJ SOLN
INTRAMUSCULAR | Status: AC
  Filled 2019-04-22: qty 2

## 2019-04-22 MED ORDER — GABAPENTIN 300 MG PO CAPS: 300.0000 mg | ORAL_CAPSULE | ORAL | Status: DC

## 2019-04-22 MED ORDER — GLYCOPYRROLATE 0.2 MG/ML IJ SOLN
INTRAMUSCULAR | Status: AC
  Filled 2019-04-22: qty 1

## 2019-04-22 MED ORDER — LIDOCAINE HCL (CARDIAC) PF 100 MG/5ML IV SOSY
PREFILLED_SYRINGE | INTRAVENOUS | Status: DC | PRN
  Administered 2019-04-22: 100 mg via INTRAVENOUS

## 2019-04-22 MED ORDER — BUPIVACAINE HCL (PF) 0.25 % IJ SOLN
INTRAMUSCULAR | Status: AC
  Filled 2019-04-22: qty 30

## 2019-04-22 MED ORDER — DEXAMETHASONE SODIUM PHOSPHATE 10 MG/ML IJ SOLN
INTRAMUSCULAR | Status: DC | PRN
  Administered 2019-04-22: 10 mg via INTRAVENOUS

## 2019-04-22 MED ORDER — ONDANSETRON HCL 4 MG/2ML IJ SOLN
INTRAMUSCULAR | Status: AC
  Filled 2019-04-22: qty 2

## 2019-04-22 MED ORDER — FENTANYL CITRATE (PF) 100 MCG/2ML IJ SOLN
INTRAMUSCULAR | Status: DC | PRN
  Administered 2019-04-22: 25 ug via INTRAVENOUS
  Administered 2019-04-22 (×2): 50 ug via INTRAVENOUS

## 2019-04-22 MED ORDER — SUCCINYLCHOLINE CHLORIDE 20 MG/ML IJ SOLN
INTRAMUSCULAR | Status: DC | PRN
  Administered 2019-04-22: 140 mg via INTRAVENOUS

## 2019-04-22 MED ORDER — MIDAZOLAM HCL 2 MG/2ML IJ SOLN
INTRAMUSCULAR | Status: DC | PRN
  Administered 2019-04-22 (×2): 1 mg via INTRAVENOUS

## 2019-04-22 MED ORDER — MIDAZOLAM HCL 2 MG/2ML IJ SOLN
INTRAMUSCULAR | Status: AC
  Filled 2019-04-22: qty 2

## 2019-04-22 MED ORDER — GABAPENTIN 300 MG PO CAPS
ORAL_CAPSULE | ORAL | Status: AC
  Administered 2019-04-22: 300 mg
  Filled 2019-04-22: qty 1

## 2019-04-22 MED ORDER — OXYCODONE HCL 5 MG PO TABS
ORAL_TABLET | ORAL | Status: AC
  Filled 2019-04-22: qty 1

## 2019-04-22 MED ORDER — GLYCOPYRROLATE 0.2 MG/ML IJ SOLN
INTRAMUSCULAR | Status: DC | PRN
  Administered 2019-04-22: .2 mg via INTRAVENOUS

## 2019-04-22 MED ORDER — BUPIVACAINE LIPOSOME 1.3 % IJ SUSP: 20.0000 mL | Freq: Once | INTRAMUSCULAR | Status: DC

## 2019-04-22 MED ORDER — ONDANSETRON HCL 4 MG/2ML IJ SOLN
INTRAMUSCULAR | Status: DC | PRN
  Administered 2019-04-22 (×2): 4 mg via INTRAVENOUS

## 2019-04-22 MED ORDER — HYDROCODONE-ACETAMINOPHEN 5-325 MG PO TABS: 1.0000 | ORAL_TABLET | Freq: Four times a day (QID) | ORAL | 0 refills | Status: DC | PRN

## 2019-04-22 MED ORDER — ACETAMINOPHEN 500 MG PO TABS
ORAL_TABLET | ORAL | Status: AC
  Administered 2019-04-22: 1000 mg
  Filled 2019-04-22: qty 2

## 2019-04-22 MED ORDER — PROPOFOL 10 MG/ML IV BOLUS
INTRAVENOUS | Status: AC
  Filled 2019-04-22: qty 20

## 2019-04-22 MED ORDER — SUGAMMADEX SODIUM 500 MG/5ML IV SOLN
INTRAVENOUS | Status: DC | PRN
  Administered 2019-04-22: 500 mg via INTRAVENOUS

## 2019-04-22 MED ORDER — ROCURONIUM BROMIDE 50 MG/5ML IV SOLN
INTRAVENOUS | Status: AC
  Filled 2019-04-22: qty 1

## 2019-04-22 MED ORDER — EPINEPHRINE PF 1 MG/ML IJ SOLN
INTRAMUSCULAR | Status: AC
  Filled 2019-04-22: qty 1

## 2019-04-22 MED ORDER — LIDOCAINE HCL (PF) 2 % IJ SOLN
INTRAMUSCULAR | Status: AC
  Filled 2019-04-22: qty 5

## 2019-04-22 MED ORDER — SUGAMMADEX SODIUM 500 MG/5ML IV SOLN
INTRAVENOUS | Status: AC
  Filled 2019-04-22: qty 5

## 2019-04-22 MED ORDER — LACTATED RINGERS IV SOLN: INTRAVENOUS | Status: DC

## 2019-04-22 MED ORDER — KETOROLAC TROMETHAMINE 30 MG/ML IJ SOLN
INTRAMUSCULAR | Status: AC
  Filled 2019-04-22: qty 1

## 2019-04-22 MED ORDER — ROCURONIUM BROMIDE 100 MG/10ML IV SOLN
INTRAVENOUS | Status: DC | PRN
  Administered 2019-04-22: 10 mg via INTRAVENOUS
  Administered 2019-04-22: 40 mg via INTRAVENOUS

## 2019-04-22 MED ORDER — CHLORHEXIDINE GLUCONATE CLOTH 2 % EX PADS
6.0000 | MEDICATED_PAD | Freq: Once | CUTANEOUS | Status: AC
  Administered 2019-04-22: 6 via TOPICAL

## 2019-04-22 MED ORDER — BUPIVACAINE-EPINEPHRINE 0.25% -1:200000 IJ SOLN
INTRAMUSCULAR | Status: DC | PRN
  Administered 2019-04-22: 30 mL

## 2019-04-22 MED ORDER — PHENYLEPHRINE HCL (PRESSORS) 10 MG/ML IV SOLN
INTRAVENOUS | Status: DC | PRN
  Administered 2019-04-22 (×3): 100 ug via INTRAVENOUS
  Administered 2019-04-22: 200 ug via INTRAVENOUS
  Administered 2019-04-22: 100 ug via INTRAVENOUS

## 2019-04-22 MED ORDER — LACTATED RINGERS IV SOLN: INTRAVENOUS | Status: DC | PRN

## 2019-04-22 MED ORDER — DEXAMETHASONE SODIUM PHOSPHATE 10 MG/ML IJ SOLN
INTRAMUSCULAR | Status: AC
  Filled 2019-04-22: qty 1

## 2019-04-22 MED ORDER — ACETAMINOPHEN 500 MG PO TABS: 1000.0000 mg | ORAL_TABLET | ORAL | Status: DC

## 2019-04-22 MED ORDER — OXYCODONE HCL 5 MG PO TABS
5.0000 mg | ORAL_TABLET | Freq: Once | ORAL | Status: AC | PRN
  Administered 2019-04-22: 5 mg via ORAL

## 2019-04-22 MED ORDER — OXYCODONE HCL 5 MG/5ML PO SOLN: 5.0000 mg | Freq: Once | ORAL | Status: AC | PRN

## 2019-04-22 MED ORDER — PROPOFOL 10 MG/ML IV BOLUS
INTRAVENOUS | Status: DC | PRN
  Administered 2019-04-22: 200 mg via INTRAVENOUS

## 2019-04-22 MED ORDER — FENTANYL CITRATE (PF) 100 MCG/2ML IJ SOLN
25.0000 ug | INTRAMUSCULAR | Status: AC | PRN
  Administered 2019-04-22 (×4): 25 ug via INTRAVENOUS

## 2019-04-22 SURGICAL SUPPLY — 28 items
BINDER ABDOMINAL 12 ML 46-62 (SOFTGOODS) ×1 IMPLANT
BLADE CLIPPER SURG (BLADE) IMPLANT
BLADE SURG 15 STRL LF DISP TIS (BLADE) ×1 IMPLANT
BLADE SURG 15 STRL SS (BLADE) ×1
CANISTER SUCT 1200ML W/VALVE (MISCELLANEOUS) ×2 IMPLANT
CHLORAPREP W/TINT 26 (MISCELLANEOUS) ×2 IMPLANT
COVER WAND RF STERILE (DRAPES) ×2 IMPLANT
DECANTER SPIKE VIAL GLASS SM (MISCELLANEOUS) ×2 IMPLANT
DERMABOND ADVANCED (GAUZE/BANDAGES/DRESSINGS) ×1
DERMABOND ADVANCED .7 DNX12 (GAUZE/BANDAGES/DRESSINGS) ×1 IMPLANT
DRAPE LAPAROTOMY 77X122 PED (DRAPES) ×2 IMPLANT
ELECT CAUTERY BLADE 6.4 (BLADE) ×2 IMPLANT
ELECT REM PT RETURN 9FT ADLT (ELECTROSURGICAL) ×2
ELECTRODE REM PT RTRN 9FT ADLT (ELECTROSURGICAL) ×1 IMPLANT
GLOVE ORTHO TXT STRL SZ7.5 (GLOVE) ×2 IMPLANT
GOWN STRL REUS W/ TWL LRG LVL3 (GOWN DISPOSABLE) ×2 IMPLANT
GOWN STRL REUS W/TWL LRG LVL3 (GOWN DISPOSABLE) ×2
KIT TURNOVER KIT A (KITS) ×2 IMPLANT
NEEDLE HYPO 22GX1.5 SAFETY (NEEDLE) ×2 IMPLANT
NS IRRIG 500ML POUR BTL (IV SOLUTION) ×2 IMPLANT
PACK BASIN MINOR ARMC (MISCELLANEOUS) ×2 IMPLANT
SUT ETHIBOND 0 MO6 C/R (SUTURE) ×1 IMPLANT
SUT MNCRL 4-0 (SUTURE) ×1
SUT MNCRL 4-0 27XMFL (SUTURE) ×1
SUT VIC AB 3-0 SH 27 (SUTURE) ×2
SUT VIC AB 3-0 SH 27X BRD (SUTURE) ×1 IMPLANT
SUTURE MNCRL 4-0 27XMF (SUTURE) ×1 IMPLANT
SYR 10ML LL (SYRINGE) ×2 IMPLANT

## 2019-04-22 NOTE — Discharge Instructions (Signed)
AMBULATORY SURGERY  DISCHARGE INSTRUCTIONS   1) The drugs that you were given will stay in your system until tomorrow so for the next 24 hours you should not:  A) Drive an automobile B) Make any legal decisions C) Drink any alcoholic beverage   2) You may resume regular meals tomorrow.  Today it is better to start with liquids and gradually work up to solid foods.  You may eat anything you prefer, but it is better to start with liquids, then soup and crackers, and gradually work up to solid foods.   3) Please notify your doctor immediately if you have any unusual bleeding, trouble breathing, redness and pain at the surgery site, drainage, fever, or pain not relieved by medication. 4)   5) Your post-operative visit with Dr.                                     is: Date:                        Time:    Please call to schedule your post-operative visit.  6) Additional Instructions:    Umbilical Hernia, Adult  A hernia is a bulge of tissue that pushes through an opening between muscles. An umbilical hernia happens in the abdomen, near the belly button (umbilicus). The hernia may contain tissues from the small intestine, large intestine, or fatty tissue covering the intestines (omentum). Umbilical hernias in adults tend to get worse over time, and they require surgical treatment. There are several types of umbilical hernias. You may have:  A hernia located just above or below the umbilicus (indirect hernia). This is the most common type of umbilical hernia in adults.  A hernia that forms through an opening formed by the umbilicus (direct hernia).  A hernia that comes and goes (reducible hernia). A reducible hernia may be visible only when you strain, lift something heavy, or cough. This type of hernia can be pushed back into the abdomen (reduced).  A hernia that traps abdominal tissue inside the hernia (incarcerated hernia). This type of hernia cannot be reduced.  A hernia that  cuts off blood flow to the tissues inside the hernia (strangulated hernia). The tissues can start to die if this happens. This type of hernia requires emergency treatment. What are the causes? An umbilical hernia happens when tissue inside the abdomen presses on a weak area of the abdominal muscles. What increases the risk? You may have a greater risk of this condition if you:  Are obese.  Have had several pregnancies.  Have a buildup of fluid inside your abdomen (ascites).  Have had surgery that weakens the abdominal muscles. What are the signs or symptoms? The main symptom of this condition is a painless bulge at or near the belly button. A reducible hernia may be visible only when you strain, lift something heavy, or cough. Other symptoms may include:  Dull pain.  A feeling of pressure. Symptoms of a strangulated hernia may include:  Pain that gets increasingly worse.  Nausea and vomiting.  Pain when pressing on the hernia.  Skin over the hernia becoming red or purple.  Constipation.  Blood in the stool. How is this diagnosed? This condition may be diagnosed based on:  A physical exam. You may be asked to cough or strain while standing. These actions increase the pressure inside your abdomen and  force the hernia through the opening in your muscles. Your health care provider may try to reduce the hernia by pressing on it.  Your symptoms and medical history. How is this treated? Surgery is the only treatment for an umbilical hernia. Surgery for a strangulated hernia is done as soon as possible. If you have a small hernia that is not incarcerated, you may need to lose weight before having surgery. Follow these instructions at home:  Lose weight, if told by your health care provider.  Do not try to push the hernia back in.  Watch your hernia for any changes in color or size. Tell your health care provider if any changes occur.  You may need to avoid activities that  increase pressure on your hernia.  Do not lift anything that is heavier than 10 lb (4.5 kg) until your health care provider says that this is safe.  Take over-the-counter and prescription medicines only as told by your health care provider.  Keep all follow-up visits as told by your health care provider. This is important. Contact a health care provider if:  Your hernia gets larger.  Your hernia becomes painful. Get help right away if:  You develop sudden, severe pain near the area of your hernia.  You have pain as well as nausea or vomiting.  You have pain and the skin over your hernia changes color.  You develop a fever. This information is not intended to replace advice given to you by your health care provider. Make sure you discuss any questions you have with your health care provider.   Your incision was closed with Dermabond.  It is best to keep it clean and dry, it will tolerate a brief shower, but do not soak it or apply any creams or lotions to the incisions.  The Dermabond should gradually flake off over time.  Keep it open to air so you can evaluate your incisions.  Dermabond assists the underlying sutures to keep your incision closed and protected from infection.  Should you develop some drainage from your incision, some drops of drainage would be okay but if it persists continue to put keep a dry dressing over it.  Resume Eliquis on Friday, March 5. Wear abdominal binder at all times.

## 2019-04-22 NOTE — Op Note (Signed)
   Pre-operative Diagnosis: Umbilical hernia.  Post-operative Diagnosis: Same  Procedure:  Umbilical hernia repair.   Surgeon: Ronny Bacon, MD  Anesthesia:  General endotracheal  Estimated Blood Loss: 20 ml  Specimens: Preperitoneal fatty tissue incarcerated in small fascial defect, excised and discarded.  Complications: None   Description of Procedure: The patient was correctly identified in the preoperative area and brought into the operating room.  The patient was placed supine with VTE prophylaxis in place.  Appropriate time-outs were performed.  Anesthesia was induced and the patient was intubated.  Appropriate antibiotics were infused.  The abdomen was prepped and draped in a sterile fashion.  A supraumbilical incision was made and cautery was used to dissect down the subcutaneous tissue opening of the dermal fascial junction, then confirming that the actual hernia defect was remarkably small well the overall abnormality was consistent with an eventration in continuity with his diastases.  The incarcerated preperitoneal adipose was excised, and the 6 fascial defect was opened and made part of the dermal fascial junction which was opened in its entirety.  The perimeter of true fascia was freshened.  The fascial edges were cleaned using cautery.   The fascia was then closed with 0 Ethibond sutures, in a vest overpants fashion without tension.  We elected as per patient agreement not to utilize mesh for this repair.  The umbilical dermis was then reattached to the fascia using 2-0 Vicryl. The wound was irrigated and local anesthetic was infused.  The wound was then closed in layers using 3-0 Vicryl and 4-0 Monocryl. The incision was cleaned and sealed with DermaBond.  The patient was emerged from anesthesia and extubated and brought to the recovery room for further management.  The patient tolerated the procedure well and all counts were correct at the end of the case.

## 2019-04-22 NOTE — Anesthesia Postprocedure Evaluation (Signed)
Anesthesia Post Note  Patient: Zachary Sweeney  Procedure(s) Performed: HERNIA REPAIR UMBILICAL ADULT (N/A )  Patient location during evaluation: PACU Anesthesia Type: General Level of consciousness: awake and alert Pain management: pain level controlled Vital Signs Assessment: post-procedure vital signs reviewed and stable Respiratory status: spontaneous breathing, nonlabored ventilation, respiratory function stable and patient connected to nasal cannula oxygen Cardiovascular status: blood pressure returned to baseline and stable Postop Assessment: no apparent nausea or vomiting Anesthetic complications: no     Last Vitals:  Vitals:   04/22/19 1724 04/22/19 1816  BP: 129/70 140/78  Pulse: (!) 58 74  Resp: 16   Temp:    SpO2: 95% 97%    Last Pain:  Vitals:   04/22/19 1816  TempSrc:   PainSc: 3                  Martha Clan

## 2019-04-22 NOTE — Transfer of Care (Signed)
Immediate Anesthesia Transfer of Care Note  Patient: Zachary Sweeney  Procedure(s) Performed: HERNIA REPAIR UMBILICAL ADULT (N/A )  Patient Location: PACU  Anesthesia Type:General  Level of Consciousness: drowsy  Airway & Oxygen Therapy: Patient Spontanous Breathing and Patient connected to face mask oxygen  Post-op Assessment: Report given to RN and Post -op Vital signs reviewed and stable  Post vital signs: Reviewed and stable  Last Vitals:  Vitals Value Taken Time  BP    Temp    Pulse    Resp    SpO2      Last Pain:  Vitals:   04/22/19 1246  TempSrc: Temporal  PainSc: 4          Complications: No apparent anesthesia complications

## 2019-04-22 NOTE — Interval H&P Note (Signed)
History and Physical Interval Note:  04/22/2019 2:45 PM  Zachary Sweeney  has presented today for surgery, with the diagnosis of Umbilical hernia.  The various methods of treatment have been discussed with the patient and family. After consideration of risks, benefits and other options for treatment, the patient has consented to  Procedure(s): HERNIA REPAIR UMBILICAL ADULT (N/A) as a surgical intervention.  The patient's history has been reviewed, patient examined, no change in status, stable for surgery.  I have reviewed the patient's chart and labs.  Questions were answered to the patient's satisfaction.   Clarified that the diastasis recti of which he c/o tenderness is not what we're addressing today.  The umbilical fascial defect is the intended repair, and without mesh.   Ronny Bacon

## 2019-04-22 NOTE — Anesthesia Preprocedure Evaluation (Addendum)
Anesthesia Evaluation  Patient identified by MRN, date of birth, ID band Patient awake    Reviewed: Allergy & Precautions, H&P , NPO status , Patient's Chart, lab work & pertinent test results  Airway Mallampati: III  TM Distance: >3 FB Neck ROM: full    Dental  (+) Edentulous Upper, Missing   Pulmonary sleep apnea and Continuous Positive Airway Pressure Ventilation , COPD,  COPD inhaler, neg recent URI, former smoker,           Cardiovascular hypertension, On Medications (-) angina(-) Past MI      Neuro/Psych negative neurological ROS  negative psych ROS   GI/Hepatic Neg liver ROS, hiatal hernia, GERD  Medicated and Controlled,  Endo/Other  negative endocrine ROS  Renal/GU      Musculoskeletal   Abdominal   Peds  Hematology negative hematology ROS (+)   Anesthesia Other Findings Obesity  Past Medical History: No date: Acid reflux No date: Back injury No date: Colon polyp No date: DVT (deep venous thrombosis) ([facility])     Comment:  a. 06/2017 LE U/S: Right popliteal vein DVT. No date: Dyspnea on exertion     Comment:  a. 06/2017 CTA Chest: No PE. No significant coronary               Ca2+, centrilobular and paraseptal emphysema;  b.  07/2017              Echo: EF 60-65%, no rwma, Gr1 DD, mildly dil LA. Nl RV               fxn; c. 07/2017 MV: Small, severe defect involving apical               inf and apical segments - only on rest images consistent               w/ artifact. No ischemia or scar. No date: Emphysema of lung ([place])     Comment:  a. 06/2017 CTA Chest: Centrilobular and paraseptal               emphysema, with mild geographic ground-glass opacity               likely representing small airway dzs. 04/17/2019: Erythrocytosis No date: Hypertension No date: Sleep apnea No date: Torn ligament  Past Surgical History: 2008: COLONOSCOPY 02/27/2017: COLONOSCOPY WITH PROPOFOL; N/A     Comment:  Procedure:  COLONOSCOPY WITH PROPOFOL;  Surgeon: Robert Bellow, MD;  Location: [facility] ENDOSCOPY;  Service:               Endoscopy;  Laterality: N/A; 07/24/2018: COLONOSCOPY WITH PROPOFOL; N/A     Comment:  Procedure: COLONOSCOPY WITH PROPOFOL;  Surgeon:               Virgel Manifold, MD;  Location: [facility] ENDOSCOPY;                Service: Endoscopy;  Laterality: N/A; 02/27/2017: ESOPHAGOGASTRODUODENOSCOPY (EGD) WITH PROPOFOL; N/A     Comment:  Procedure: ESOPHAGOGASTRODUODENOSCOPY (EGD) WITH               PROPOFOL;  Surgeon: Robert Bellow, MD;  Location:               [facility] ENDOSCOPY;  Service: Endoscopy;  Laterality: N/A; 07/24/2018: ESOPHAGOGASTRODUODENOSCOPY (EGD) WITH PROPOFOL; N/A     Comment:  Procedure: ESOPHAGOGASTRODUODENOSCOPY (EGD) WITH  PROPOFOL;  Surgeon: Virgel Manifold, MD;  Location:               [facility] ENDOSCOPY;  Service: Endoscopy;  Laterality: N/A; No date: HERNIA REPAIR; Right     Comment:  inguinal No date: KNEE SURGERY; Left No date: NECK SURGERY No date: ROTATOR CUFF REPAIR; Right  BMI    Body Mass Index: 39.17 kg/m      Reproductive/Obstetrics negative OB ROS                            Anesthesia Physical Anesthesia Plan  ASA: III  Anesthesia Plan: General ETT   Post-op Pain Management:    Induction:   PONV Risk Score and Plan: Ondansetron, Dexamethasone and Treatment may vary due to age or medical condition  Airway Management Planned:   Additional Equipment:   Intra-op Plan:   Post-operative Plan:   Informed Consent: I have reviewed the patients History and Physical, chart, labs and discussed the procedure including the risks, benefits and alternatives for the proposed anesthesia with the patient or authorized representative who has indicated his/her understanding and acceptance.     Dental Advisory Given  Plan Discussed with: Anesthesiologist  Anesthesia Plan Comments:          Anesthesia Quick Evaluation

## 2019-04-22 NOTE — Anesthesia Procedure Notes (Signed)
Procedure Name: Intubation Performed by: Kelton Pillar, CRNA Pre-anesthesia Checklist: Patient identified, Emergency Drugs available, Suction available and Patient being monitored Patient Re-evaluated:Patient Re-evaluated prior to induction Oxygen Delivery Method: Circle system utilized Preoxygenation: Pre-oxygenation with 100% oxygen Induction Type: IV induction Ventilation: Oral airway inserted - appropriate to patient size Laryngoscope Size: McGraph and 4 Grade View: Grade I Tube type: Oral Tube size: 7.5 mm Number of attempts: 1 Airway Equipment and Method: Stylet and Patient positioned with wedge pillow Placement Confirmation: ETT inserted through vocal cords under direct vision,  positive ETCO2,  CO2 detector and breath sounds checked- equal and bilateral Secured at: 20 cm Tube secured with: Tape Dental Injury: Teeth and Oropharynx as per pre-operative assessment

## 2019-04-27 ENCOUNTER — Inpatient Hospital Stay: Payer: Medicare Other | Attending: Oncology

## 2019-04-30 ENCOUNTER — Other Ambulatory Visit: Payer: Self-pay

## 2019-04-30 ENCOUNTER — Ambulatory Visit (INDEPENDENT_AMBULATORY_CARE_PROVIDER_SITE_OTHER): Payer: Self-pay | Admitting: Surgery

## 2019-04-30 ENCOUNTER — Encounter: Payer: Self-pay | Admitting: Surgery

## 2019-04-30 VITALS — BP 146/82 | HR 71 | Temp 97.5°F | Resp 12 | Ht 73.0 in | Wt 300.0 lb

## 2019-04-30 DIAGNOSIS — Z09 Encounter for follow-up examination after completed treatment for conditions other than malignant neoplasm: Secondary | ICD-10-CM

## 2019-04-30 NOTE — Progress Notes (Signed)
[facility] POST-OP OFFICE VISIT  04/30/2019  HPI: Zachary Sweeney is a 74 y.o. male 8 days s/p umbilical hernia repair.  He reports some tenderness/soreness to the area but otherwise is not taking any pain medications.  He denies any drainage, fever, chills, nausea or vomiting.  He reports normal appetite.  And he feels he is improved because now he can touch his toes, or so he says.  Vital signs: BP (!) 146/82   Pulse 71   Temp (!) 97.5 F (36.4 C)   Resp 12   Ht 6\' 1"  (1.854 m)   Wt 300 lb (136.1 kg)   BMI 39.58 kg/m    Physical Exam: Constitutional: Appears well, nontoxic, bright and conversational. Abdomen: Umbilical incision is clean, dry and intact.  Dermabond is still present and intact.  Expected ecchymotic changes are present.  No induration or erythema noted.   Assessment/Plan: This is a 74 y.o. male 8 days s/p umbilical hernia repair, progressing well.  Patient Active Problem List   Diagnosis Date Noted  . Erythrocytosis 04/17/2019  . Positive fecal occult blood test   . Polyp of colon   . Diverticulosis of large intestine without diverticulitis   . CLE (columnar lined esophagus)   . Hiatal hernia   . DOE (dyspnea on exertion) 07/19/2017  . Atypical chest pain 07/19/2017  . Deep vein thrombosis (DVT) of popliteal vein of right lower extremity ([place]) 07/19/2017  . Benign prostatic hyperplasia 06/25/2017  . Essential hypertension 06/25/2017  . Esophageal dysphagia 01/31/2017  . Epigastric pain 01/31/2017  . Obesity, unspecified 06/26/2013    -Advised him to continue wearing his binder, will see him back in a month to assess the progress of his healing.  Otherwise we can see him as needed.   Ronny Bacon M.D., FACS 04/30/2019, 9:34 AM

## 2019-04-30 NOTE — Patient Instructions (Addendum)
Dr.Rodenberg advised patient that the tenderness in the area would get better with time. Patient will follow up in one month.  GENERAL POST-OPERATIVE PATIENT INSTRUCTIONS   FOLLOW-UP:  Please make an appointment with your physician in.  Call your physician immediately if you have any fevers greater than 102.5, drainage from you wound that is not clear or looks infected, persistent bleeding, increasing abdominal pain, problems urinating, or persistent nausea/vomiting.    WOUND CARE INSTRUCTIONS:  Keep a dry clean dressing on the wound if there is drainage. The initial bandage may be removed after 24 hours.  Once the wound has quit draining you may leave it open to air.  If clothing rubs against the wound or causes irritation and the wound is not draining you may cover it with a dry dressing during the daytime.  Try to keep the wound dry and avoid ointments on the wound unless directed to do so.  If the wound becomes bright red and painful or starts to drain infected material that is not clear, please contact your physician immediately.  If the wound is mildly pink and has a thick firm ridge underneath it, this is normal, and is referred to as a healing ridge.  This will resolve over the next 4-6 weeks.  DIET:  You may eat any foods that you can tolerate.  It is a good idea to eat a high fiber diet and take in plenty of fluids to prevent constipation.  If you do become constipated you may want to take a mild laxative or take ducolax tablets on a daily basis until your bowel habits are regular.  Constipation can be very uncomfortable, along with straining, after recent surgery.  ACTIVITY:  You are encouraged to cough and deep breath or use your incentive spirometer if you were given one, every 15-30 minutes when awake.  This will help prevent respiratory complications and low grade fevers post-operatively if you had a general anesthetic.  You may want to hug a pillow when coughing and sneezing to add  additional support to the surgical area, if you had abdominal or chest surgery, which will decrease pain during these times.  You are encouraged to walk and engage in light activity for the next two weeks.  You should not lift more than 20 pounds during this time frame as it could put you at increased risk for complications.  Twenty pounds is roughly equivalent to a plastic bag of groceries.    MEDICATIONS:  Try to take narcotic medications and anti-inflammatory medications, such as tylenol, ibuprofen, naprosyn, etc., with food.  This will minimize stomach upset from the medication.  Should you develop nausea and vomiting from the pain medication, or develop a rash, please discontinue the medication and contact your physician.  You should not drive, make important decisions, or operate machinery when taking narcotic pain medication.  QUESTIONS:  Please feel free to call your physician or the hospital operator if you have any questions, and they will be glad to assist you.

## 2019-05-28 ENCOUNTER — Ambulatory Visit (INDEPENDENT_AMBULATORY_CARE_PROVIDER_SITE_OTHER): Payer: Self-pay | Admitting: Surgery

## 2019-05-28 ENCOUNTER — Encounter: Payer: Self-pay | Admitting: Surgery

## 2019-05-28 ENCOUNTER — Other Ambulatory Visit: Payer: Self-pay

## 2019-05-28 VITALS — BP 154/92 | HR 76 | Temp 97.6°F | Resp 15 | Ht 73.0 in | Wt 298.0 lb

## 2019-05-28 DIAGNOSIS — Z09 Encounter for follow-up examination after completed treatment for conditions other than malignant neoplasm: Secondary | ICD-10-CM

## 2019-05-28 NOTE — Progress Notes (Signed)
[facility] POST-OP OFFICE VISIT  05/28/2019  HPI: Zachary Sweeney is a 74 y.o. male 29 days s/p umbilical hernia repair.  He reports being pain-free and quite comfortable.  Currently pleased with his results.  Vital signs: BP (!) 154/92   Pulse 76   Temp 97.6 F (36.4 C)   Resp 15   Ht 6\' 1"  (1.854 m)   Wt 298 lb (135.2 kg)   SpO2 92%   BMI 39.32 kg/m    Physical Exam: Constitutional: Appears well Abdomen: Incisions are well-healed without evidence of recurrent bulge or mass. Skin: Without rashes or inflammation, abrasions or wound.  Assessment/Plan: This is a 74 y.o. male 48 days s/p umbilical hernia repair.  Patient Active Problem List   Diagnosis Date Noted  . S/P umbilical hernia repair, follow-up exam 04/30/2019  . Erythrocytosis 04/17/2019  . Positive fecal occult blood test   . Polyp of colon   . Diverticulosis of large intestine without diverticulitis   . CLE (columnar lined esophagus)   . Hiatal hernia   . DOE (dyspnea on exertion) 07/19/2017  . Atypical chest pain 07/19/2017  . Deep vein thrombosis (DVT) of popliteal vein of right lower extremity ([place]) 07/19/2017  . Benign prostatic hyperplasia 06/25/2017  . Essential hypertension 06/25/2017  . Esophageal dysphagia 01/31/2017  . Epigastric pain 01/31/2017  . Obesity, unspecified 06/26/2013    -We discussed resumption of full activity, following his 6 weeks of restrictions for lifting.  We discussed the potential for recurrence.  We discussed the aid of weight loss in diminishing his risk of recurrence.  We glad to see him again should the need arise.   Ronny Bacon M.D., FACS 05/28/2019, 9:23 AM

## 2019-05-28 NOTE — Patient Instructions (Addendum)
Follow-up with our office as needed.  Please call and ask to speak with a nurse if you develop questions or concerns.   GENERAL POST-OPERATIVE PATIENT INSTRUCTIONS   WOUND CARE INSTRUCTIONS:  Keep a dry clean dressing on the wound if there is drainage. The initial bandage may be removed after 24 hours.  Once the wound has quit draining you may leave it open to air.  If clothing rubs against the wound or causes irritation and the wound is not draining you may cover it with a dry dressing during the daytime.  Try to keep the wound dry and avoid ointments on the wound unless directed to do so.  If the wound becomes bright red and painful or starts to drain infected material that is not clear, please contact your physician immediately.  If the wound is mildly pink and has a thick firm ridge underneath it, this is normal, and is referred to as a healing ridge.  This will resolve over the next 4-6 weeks.  BATHING: You may shower if you have been informed of this by your surgeon. However, Please do not submerge in a tub, hot tub, or pool until incisions are completely sealed or have been told by your surgeon that you may do so.  DIET:  You may eat any foods that you can tolerate.  It is a good idea to eat a high fiber diet and take in plenty of fluids to prevent constipation.  If you do become constipated you may want to take a mild laxative or take ducolax tablets on a daily basis until your bowel habits are regular.  Constipation can be very uncomfortable, along with straining, after recent surgery.  ACTIVITY:  You are encouraged to cough and deep breath or use your incentive spirometer if you were given one, every 15-30 minutes when awake.  This will help prevent respiratory complications and low grade fevers post-operatively if you had a general anesthetic.  You may want to hug a pillow when coughing and sneezing to add additional support to the surgical area, if you had abdominal or chest surgery, which  will decrease pain during these times.  You are encouraged to walk and engage in light activity for the next two weeks.  You should not lift more than 20 pounds for 6 weeks after surgery as it could put you at increased risk for complications.  Twenty pounds is roughly equivalent to a plastic bag of groceries. At that time- Listen to your body when lifting, if you have pain when lifting, stop and then try again in a few days. Soreness after doing exercises or activities of daily living is normal as you get back in to your normal routine.  MEDICATIONS:  Try to take narcotic medications and anti-inflammatory medications, such as tylenol, ibuprofen, naprosyn, etc., with food.  This will minimize stomach upset from the medication.  Should you develop nausea and vomiting from the pain medication, or develop a rash, please discontinue the medication and contact your physician.  You should not drive, make important decisions, or operate machinery when taking narcotic pain medication.  SUNBLOCK Use sun block to incision area over the next year if this area will be exposed to sun. This helps decrease scarring and will allow you avoid a permanent darkened area over your incision.  QUESTIONS:  Please feel free to call our office if you have any questions, and we will be glad to assist you.    

## 2019-07-13 ENCOUNTER — Other Ambulatory Visit: Payer: Self-pay

## 2019-07-13 ENCOUNTER — Encounter: Payer: Self-pay | Admitting: Oncology

## 2019-07-13 ENCOUNTER — Inpatient Hospital Stay: Payer: Medicare Other | Attending: Oncology

## 2019-07-13 ENCOUNTER — Inpatient Hospital Stay (HOSPITAL_BASED_OUTPATIENT_CLINIC_OR_DEPARTMENT_OTHER): Payer: Medicare Other | Admitting: Oncology

## 2019-07-13 ENCOUNTER — Inpatient Hospital Stay: Payer: Medicare Other

## 2019-07-13 VITALS — BP 134/80 | HR 65 | Resp 18

## 2019-07-13 VITALS — BP 142/86 | HR 73 | Temp 96.6°F | Resp 18 | Wt 299.1 lb

## 2019-07-13 DIAGNOSIS — Z7901 Long term (current) use of anticoagulants: Secondary | ICD-10-CM

## 2019-07-13 DIAGNOSIS — D751 Secondary polycythemia: Secondary | ICD-10-CM

## 2019-07-13 DIAGNOSIS — Z86718 Personal history of other venous thrombosis and embolism: Secondary | ICD-10-CM | POA: Insufficient documentation

## 2019-07-13 LAB — CBC WITH DIFFERENTIAL/PLATELET
Abs Immature Granulocytes: 0.01 10*3/uL (ref 0.00–0.07)
Basophils Absolute: 0.1 10*3/uL (ref 0.0–0.1)
Basophils Relative: 1 %
Eosinophils Absolute: 0.1 10*3/uL (ref 0.0–0.5)
Eosinophils Relative: 2 %
HCT: 50.1 % (ref 39.0–52.0)
Hemoglobin: 17.6 g/dL — ABNORMAL HIGH (ref 13.0–17.0)
Immature Granulocytes: 0 %
Lymphocytes Relative: 32 %
Lymphs Abs: 2.2 10*3/uL (ref 0.7–4.0)
MCH: 30.8 pg (ref 26.0–34.0)
MCHC: 35.1 g/dL (ref 30.0–36.0)
MCV: 87.7 fL (ref 80.0–100.0)
Monocytes Absolute: 0.5 10*3/uL (ref 0.1–1.0)
Monocytes Relative: 7 %
Neutro Abs: 4 10*3/uL (ref 1.7–7.7)
Neutrophils Relative %: 58 %
Platelets: 145 10*3/uL — ABNORMAL LOW (ref 150–400)
RBC: 5.71 MIL/uL (ref 4.22–5.81)
RDW: 13.6 % (ref 11.5–15.5)
WBC: 6.9 10*3/uL (ref 4.0–10.5)
nRBC: 0 % (ref 0.0–0.2)

## 2019-07-13 LAB — COMPREHENSIVE METABOLIC PANEL
ALT: 19 U/L (ref 0–44)
AST: 21 U/L (ref 15–41)
Albumin: 4.2 g/dL (ref 3.5–5.0)
Alkaline Phosphatase: 51 U/L (ref 38–126)
Anion gap: 8 (ref 5–15)
BUN: 13 mg/dL (ref 8–23)
CO2: 24 mmol/L (ref 22–32)
Calcium: 9.2 mg/dL (ref 8.9–10.3)
Chloride: 108 mmol/L (ref 98–111)
Creatinine, Ser: 1.28 mg/dL — ABNORMAL HIGH (ref 0.61–1.24)
GFR calc Af Amer: >60 mL/min (ref >60)
GFR calc non Af Amer: 56 mL/min — ABNORMAL LOW (ref >60)
Glucose, Bld: 103 mg/dL — ABNORMAL HIGH (ref 70–99)
Potassium: 4 mmol/L (ref 3.5–5.1)
Sodium: 140 mmol/L (ref 135–145)
Total Bilirubin: 1.1 mg/dL (ref 0.3–1.2)
Total Protein: 7.6 g/dL (ref 6.5–8.1)

## 2019-07-13 NOTE — Progress Notes (Signed)
Pt here for follow up. No concerns voiced.  

## 2019-07-13 NOTE — Progress Notes (Signed)
Hematology/Oncology follow-up progress note [facility] [phone] [phone]   Patient Care Team: Gennette Pac, [facility] as PCP - General (Family Medicine) End, Harrell Gave, MD as PCP - Cardiology (Cardiology)  REFERRING PROVIDER: Dr.Tahiliani Margretta Sidle CHIEF COMPLAINTS/REASON FOR VISIT:  Follow-up for DVT  HISTORY OF PRESENTING ILLNESS:  Zachary Sweeney is a  74 y.o.  male with PMH listed below who was referred to me for evaluation of DVT Patient recently established care with gastroenterology for consultation and management of chronic history of diffuse abdominal pain as well as epigastric pain.  Not related to meals, exacerbated when he bends down.  Not associated with nausea or vomiting. Denies any weight loss, fever, chills, night sweats.  Appetite is fair. Denies hematochezia, hematuria, hematemesis, epistaxis, black tarry stool or easy bruising.  Endoscopy procedures were recommended for further evaluation.  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  Patient was referred by gastroenterology to me for further evaluation and peri-procedure anticoagulation recommendation. Patient also was found to have an abdominal wall hernia which caused this pain with bending down.  He was also referred to see surgery Dr. Hampton Abbot for further evaluation.  06/25/2017, patient had US venous bilateral lower extremity which showed a focal area of partial compressibility popliteal Vein on the right, compatible with a partially occlusive DVT.  The borders are smooth and this has a subacute appearance. Left lower extremity no DVT.   Patient recalls that he had right foot pain and swelling, as the initial presentation which prompted ultrasound evaluation. Patient was started on Eliquis after ED discussed his case with vascular surgery Dr. Lucky Cowboy. Patient was taken off Eliquis in November 2019. Presented to [place] emergency room on  03/30/2018 complaining right lower extremity pain and swelling again which is similar to when he had a blood clot.  Patient was concerned that he could have developed blood clots again so he restarted taking Eliquis for the past week prior to presenting to the emergency room.  He mentions to a friend at the church and they urged him to go to emergency room. In the ED, ultrasound showed nonocclusive right lower extremity DVT. 04/10/2018, he had CT abdomen pelvis done for evaluation of chronic abdominal pain.  CT showed no acute findings in the abdomen and pelvis.  Nonobstructive punctate stones over the lower pole collecting system of the right kidney.  Small umbilical hernia containing only peritoneal fat.  Aortic atherosclerosis.  Patient reports feeling well today.  Right lower extremity pain and swelling has improved.  He is taking Eliquis 5 mg twice daily since February 2020.  Tolerates well.  No bleeding events.  #  GI work-up done on 07/24/2018. Colonoscopy showed colon polyps which were resected and retrieved.  Diverticulosis.  Biopsy showed tubular adenoma.  Recommend repeat colonoscopy in 3 years negative for high-grade dysplasia and malignancy. EGD showed salmon-colored mucosa suggestive of Barrett's esophagus.  Biopsied.  Per GI note, biopsy showed evidence of acid reflux.  Patient was started on Pepcid.  #  07/10/2018 US venous lower extremity unilateral right findings compatible with resolving isolated right popliteal DVT. Recommend patient to be switched to Eliquis 2.5 mg twice daily for long-term anticoagulation maintenance.  INTERVAL HISTORY Zachary Sweeney is a 74 y.o. male who has above history reviewed by me today presents for follow up visit for management of DVT Problems and complaints are listed below: During interval, patient had hernia repair done.  He is going to have a repeat  sleep study. Currently on Eliquis 2.5 mg twice daily.  He has no new complaints. Review of Systems    Constitutional: Negative for appetite change, chills, fatigue, fever and unexpected weight change.  HENT:   Negative for hearing loss and voice change.   Eyes: Negative for eye problems and icterus.  Respiratory: Negative for chest tightness, cough and shortness of breath.   Cardiovascular: Negative for chest pain and leg swelling.  Gastrointestinal: Negative for abdominal distention, abdominal pain, nausea and vomiting.  Endocrine: Negative for hot flashes.  Genitourinary: Negative for difficulty urinating, dysuria and frequency.   Musculoskeletal: Negative for arthralgias.  Skin: Negative for itching and rash.  Neurological: Negative for light-headedness and numbness.  Hematological: Negative for adenopathy. Does not bruise/bleed easily.  Psychiatric/Behavioral: Negative for confusion.    MEDICAL HISTORY:  Past Medical History:  Diagnosis Date  . Acid reflux   . Back injury   . Colon polyp   . DVT (deep venous thrombosis) ([place])    a. 06/2017 LE U/S: Right popliteal vein DVT.  Marland Kitchen Dyspnea on exertion    a. 06/2017 CTA Chest: No PE. No significant coronary Ca2+, centrilobular and paraseptal emphysema;  b.  07/2017 Echo: EF 60-65%, no rwma, Gr1 DD, mildly dil LA. Nl RV fxn; c. 07/2017 MV: Small, severe defect involving apical inf and apical segments - only on rest images consistent w/ artifact. No ischemia or scar.  . Emphysema of lung ([place])    a. 06/2017 CTA Chest: Centrilobular and paraseptal emphysema, with mild geographic ground-glass opacity likely representing small airway dzs.  . Erythrocytosis 04/17/2019  . Hypertension   . Sleep apnea   . Torn ligament     SURGICAL HISTORY: Past Surgical History:  Procedure Laterality Date  . COLONOSCOPY  2008  . COLONOSCOPY WITH PROPOFOL N/A 02/27/2017   Procedure: COLONOSCOPY WITH PROPOFOL;  Surgeon: Robert Bellow, MD;  Location: [facility] ENDOSCOPY;  Service: Endoscopy;  Laterality: N/A;  . COLONOSCOPY WITH PROPOFOL N/A 07/24/2018    Procedure: COLONOSCOPY WITH PROPOFOL;  Surgeon: Virgel Manifold, MD;  Location: [facility] ENDOSCOPY;  Service: Endoscopy;  Laterality: N/A;  . ESOPHAGOGASTRODUODENOSCOPY (EGD) WITH PROPOFOL N/A 02/27/2017   Procedure: ESOPHAGOGASTRODUODENOSCOPY (EGD) WITH PROPOFOL;  Surgeon: Robert Bellow, MD;  Location: [facility] ENDOSCOPY;  Service: Endoscopy;  Laterality: N/A;  . ESOPHAGOGASTRODUODENOSCOPY (EGD) WITH PROPOFOL N/A 07/24/2018   Procedure: ESOPHAGOGASTRODUODENOSCOPY (EGD) WITH PROPOFOL;  Surgeon: Virgel Manifold, MD;  Location: [facility] ENDOSCOPY;  Service: Endoscopy;  Laterality: N/A;  . HERNIA REPAIR Right    inguinal  . KNEE SURGERY Left   . NECK SURGERY    . ROTATOR CUFF REPAIR Right   . UMBILICAL HERNIA REPAIR N/A 04/22/2019   Procedure: HERNIA REPAIR UMBILICAL ADULT;  Surgeon: Ronny Bacon, MD;  Location: [facility] ORS;  Service: General;  Laterality: N/A;    SOCIAL HISTORY: Social History   Socioeconomic History  . Marital status: Single    Spouse name: Not on file  . Number of children: Not on file  . Years of education: Not on file  . Highest education level: Not on file  Occupational History  . Occupation: retired  Tobacco Use  . Smoking status: Former Smoker    Packs/day: 0.25    Years: 30.00    Pack years: 7.50    Types: Cigarettes    Quit date: 02/19/1994    Years since quitting: 25.4  . Smokeless tobacco: Never Used  Substance and Sexual Activity  . Alcohol use: No  . Drug use:  No  . Sexual activity: Not on file  Other Topics Concern  . Not on file  Social History Narrative  . Not on file   Social Determinants of Health   Financial Resource Strain:   . Difficulty of Paying Living Expenses:   Food Insecurity:   . Worried About Charity fundraiser in the Last Year:   . Arboriculturist in the Last Year:   Transportation Needs:   . Film/video editor (Medical):   Marland Kitchen Lack of Transportation (Non-Medical):   Physical Activity:   . Days of Exercise per Week:    . Minutes of Exercise per Session:   Stress:   . Feeling of Stress :   Social Connections:   . Frequency of Communication with Friends and Family:   . Frequency of Social Gatherings with Friends and Family:   . Attends Religious Services:   . Active Member of Clubs or Organizations:   . Attends Archivist Meetings:   Marland Kitchen Marital Status:   Intimate Partner Violence:   . Fear of Current or Ex-Partner:   . Emotionally Abused:   Marland Kitchen Physically Abused:   . Sexually Abused:     FAMILY HISTORY: Family History  Problem Relation Age of Onset  . Lung cancer Mother   . Heart disease Father   . Prostate cancer Maternal Uncle     ALLERGIES:  has No Known Allergies.  MEDICATIONS:  Current Outpatient Medications  Medication Sig Dispense Refill  . albuterol (VENTOLIN HFA) 108 (90 Base) MCG/ACT inhaler SMARTSIG:2 Puff(s) By Mouth Every 4-6 Hours PRN    . apixaban (ELIQUIS) 2.5 MG TABS tablet Take 1 tablet (2.5 mg total) by mouth 2 (two) times daily. 180 tablet 1  . clotrimazole (LOTRIMIN) 1 % cream Apply 1 application topically daily.     Marland Kitchen diltiazem (CARDIZEM CD) 300 MG 24 hr capsule Take 1 capsule (300 mg total) by mouth daily. 90 capsule 3  . ketoconazole (NIZORAL) 2 % cream Apply 1 application topically 2 (two) times daily as needed for irritation.     Marland Kitchen ketoconazole (NIZORAL) 2 % shampoo Apply 1 application topically once a week.     Marland Kitchen omeprazole (PRILOSEC) 20 MG capsule Take 1 capsule (20 mg total) by mouth daily. 30 capsule 6  . SPIRIVA RESPIMAT 2.5 MCG/ACT AERS SMARTSIG:2 Inhalation By Mouth Daily     No current facility-administered medications for this visit.     PHYSICAL EXAMINATION: ECOG PERFORMANCE STATUS: 0 - Asymptomatic Vitals:   07/13/19 1332  BP: (!) 142/86  Pulse: 73  Resp: 18  Temp: (!) 96.6 F (35.9 C)   Filed Weights   07/13/19 1332  Weight: 299 lb 1.6 oz (135.7 kg)    Physical Exam Constitutional:      General: He is not in acute  distress. HENT:     Head: Normocephalic and atraumatic.  Eyes:     General: No scleral icterus.    Pupils: Pupils are equal, round, and reactive to light.  Cardiovascular:     Rate and Rhythm: Normal rate and regular rhythm.     Heart sounds: Normal heart sounds.  Pulmonary:     Effort: Pulmonary effort is normal. No respiratory distress.     Breath sounds: No wheezing.  Abdominal:     General: Bowel sounds are normal. There is no distension.     Palpations: Abdomen is soft. There is no mass.     Tenderness: There is no abdominal tenderness.  Musculoskeletal:        General: No deformity. Normal range of motion.     Cervical back: Normal range of motion and neck supple.  Skin:    General: Skin is warm and dry.     Findings: No erythema or rash.  Neurological:     Mental Status: He is alert and oriented to person, place, and time. Mental status is at baseline.     Cranial Nerves: No cranial nerve deficit.     Coordination: Coordination normal.  Psychiatric:        Mood and Affect: Mood normal.        Behavior: Behavior normal.        Thought Content: Thought content normal.     RADIOGRAPHIC STUDIES: I have personally reviewed the radiological images as listed and agreed with the findings in the report.  CMP Latest Ref Rng & Units 07/13/2019  Glucose 70 - 99 mg/dL 103(H)  BUN 8 - 23 mg/dL 13  Creatinine 0.61 - 1.24 mg/dL 1.28(H)  Sodium 135 - 145 mmol/L 140  Potassium 3.5 - 5.1 mmol/L 4.0  Chloride 98 - 111 mmol/L 108  CO2 22 - 32 mmol/L 24  Calcium 8.9 - 10.3 mg/dL 9.2  Total Protein 6.5 - 8.1 g/dL 7.6  Total Bilirubin 0.3 - 1.2 mg/dL 1.1  Alkaline Phos 38 - 126 U/L 51  AST 15 - 41 U/L 21  ALT 0 - 44 U/L 19   CBC Latest Ref Rng & Units 07/13/2019  WBC 4.0 - 10.5 K/uL 6.9  Hemoglobin 13.0 - 17.0 g/dL 17.6(H)  Hematocrit 39.0 - 52.0 % 50.1  Platelets 150 - 400 K/uL 145(L)    LABORATORY DATA:  I have reviewed the data as listed Lab Results  Component Value Date    WBC 6.9 07/13/2019   HGB 17.6 (H) 07/13/2019   HCT 50.1 07/13/2019   MCV 87.7 07/13/2019   PLT 145 (L) 07/13/2019   Recent Labs    10/20/18 0939 10/20/18 0939 03/18/19 1526 04/17/19 0941 07/13/19 1316  NA 138   < > 137 137 140  K 3.9   < > 4.2 3.6 4.0  CL 107   < > 105 106 108  CO2 24   < > 25 21* 24  GLUCOSE 104*   < > 97 119* 103*  BUN 14   < > 13 10 13   CREATININE 1.26*   < > 1.27* 1.04 1.28*  CALCIUM 9.2   < > 9.4 9.2 9.2  GFRNONAA 57*   < > 56* >60 56*  GFRAA >60   < > >60 >60 >60  PROT 7.7  --   --  7.9 7.6  ALBUMIN 4.2  --   --  4.1 4.2  AST 24  --   --  25 21  ALT 20  --   --  20 19  ALKPHOS 45  --   --  50 51  BILITOT 1.2  --   --  1.3* 1.1   < > = values in this interval not displayed.   Iron/TIBC/Ferritin/ %Sat No results found for: IRON, TIBC, FERRITIN, IRONPCTSAT      ASSESSMENT & PLAN:  1. Erythrocytosis   2. History of DVT of lower extremity   3. Chronic anticoagulation    #History of recurrent DVT. Continue Eliquis 2.5 mg twice daily for maintenance.  #Erythrocytosis, Hemoglobin trended down to 17.6, hematocrit 50.1. Small amount of blood loss from recent hernia repair may have contributed  to the improvement of blood work. I recommend phlebotomy 300cc x 1  given that hematocrit is above 50.1. He is scheduled to have a repeat sleep study done and future CPAP use may further help reducing his erythrocytosis  He can follow-up in 2 months    Orders Placed This Encounter  Procedures  . CBC with Differential/Platelet    Standing Status:   Future    Standing Expiration Date:   07/12/2020  . Comprehensive metabolic panel    Standing Status:   Future    Standing Expiration Date:   07/12/2020    All questions were answered. The patient knows to call the clinic with any problems questions or concerns.  Earlie Server, MD, PhD Hematology Oncology [facility] Pager- [phone] 07/13/2019

## 2019-09-11 ENCOUNTER — Inpatient Hospital Stay: Payer: Medicare Other

## 2019-09-11 ENCOUNTER — Inpatient Hospital Stay: Payer: Medicare Other | Attending: Oncology

## 2019-09-11 ENCOUNTER — Encounter: Payer: Self-pay | Admitting: Oncology

## 2019-09-11 ENCOUNTER — Inpatient Hospital Stay (HOSPITAL_BASED_OUTPATIENT_CLINIC_OR_DEPARTMENT_OTHER): Payer: Medicare Other | Admitting: Oncology

## 2019-09-11 ENCOUNTER — Other Ambulatory Visit: Payer: Self-pay

## 2019-09-11 VITALS — BP 134/79 | HR 67 | Temp 97.9°F | Resp 18 | Ht 73.0 in | Wt 303.8 lb

## 2019-09-11 DIAGNOSIS — Z7901 Long term (current) use of anticoagulants: Secondary | ICD-10-CM | POA: Diagnosis not present

## 2019-09-11 DIAGNOSIS — G4730 Sleep apnea, unspecified: Secondary | ICD-10-CM | POA: Diagnosis not present

## 2019-09-11 DIAGNOSIS — J439 Emphysema, unspecified: Secondary | ICD-10-CM | POA: Diagnosis not present

## 2019-09-11 DIAGNOSIS — D751 Secondary polycythemia: Secondary | ICD-10-CM

## 2019-09-11 DIAGNOSIS — Z86718 Personal history of other venous thrombosis and embolism: Secondary | ICD-10-CM

## 2019-09-11 DIAGNOSIS — K219 Gastro-esophageal reflux disease without esophagitis: Secondary | ICD-10-CM | POA: Insufficient documentation

## 2019-09-11 DIAGNOSIS — Z79899 Other long term (current) drug therapy: Secondary | ICD-10-CM | POA: Insufficient documentation

## 2019-09-11 DIAGNOSIS — Z87891 Personal history of nicotine dependence: Secondary | ICD-10-CM | POA: Insufficient documentation

## 2019-09-11 DIAGNOSIS — G8929 Other chronic pain: Secondary | ICD-10-CM | POA: Insufficient documentation

## 2019-09-11 DIAGNOSIS — Z8042 Family history of malignant neoplasm of prostate: Secondary | ICD-10-CM | POA: Diagnosis not present

## 2019-09-11 DIAGNOSIS — Z8249 Family history of ischemic heart disease and other diseases of the circulatory system: Secondary | ICD-10-CM | POA: Insufficient documentation

## 2019-09-11 DIAGNOSIS — I10 Essential (primary) hypertension: Secondary | ICD-10-CM | POA: Insufficient documentation

## 2019-09-11 DIAGNOSIS — Z801 Family history of malignant neoplasm of trachea, bronchus and lung: Secondary | ICD-10-CM | POA: Insufficient documentation

## 2019-09-11 LAB — CBC WITH DIFFERENTIAL/PLATELET
Abs Immature Granulocytes: 0.02 10*3/uL (ref 0.00–0.07)
Basophils Absolute: 0.1 10*3/uL (ref 0.0–0.1)
Basophils Relative: 1 %
Eosinophils Absolute: 0.2 10*3/uL (ref 0.0–0.5)
Eosinophils Relative: 2 %
HCT: 48.2 % (ref 39.0–52.0)
Hemoglobin: 16.7 g/dL (ref 13.0–17.0)
Immature Granulocytes: 0 %
Lymphocytes Relative: 35 %
Lymphs Abs: 2.5 10*3/uL (ref 0.7–4.0)
MCH: 30.4 pg (ref 26.0–34.0)
MCHC: 34.6 g/dL (ref 30.0–36.0)
MCV: 87.6 fL (ref 80.0–100.0)
Monocytes Absolute: 0.6 10*3/uL (ref 0.1–1.0)
Monocytes Relative: 9 %
Neutro Abs: 3.8 10*3/uL (ref 1.7–7.7)
Neutrophils Relative %: 53 %
Platelets: 149 10*3/uL — ABNORMAL LOW (ref 150–400)
RBC: 5.5 MIL/uL (ref 4.22–5.81)
RDW: 13.2 % (ref 11.5–15.5)
WBC: 7.1 10*3/uL (ref 4.0–10.5)
nRBC: 0 % (ref 0.0–0.2)

## 2019-09-11 LAB — COMPREHENSIVE METABOLIC PANEL
ALT: 22 U/L (ref 0–44)
AST: 25 U/L (ref 15–41)
Albumin: 4.1 g/dL (ref 3.5–5.0)
Alkaline Phosphatase: 58 U/L (ref 38–126)
Anion gap: 6 (ref 5–15)
BUN: 14 mg/dL (ref 8–23)
CO2: 27 mmol/L (ref 22–32)
Calcium: 9.4 mg/dL (ref 8.9–10.3)
Chloride: 105 mmol/L (ref 98–111)
Creatinine, Ser: 1.22 mg/dL (ref 0.61–1.24)
GFR calc Af Amer: >60 mL/min (ref >60)
GFR calc non Af Amer: 59 mL/min — ABNORMAL LOW (ref >60)
Glucose, Bld: 93 mg/dL (ref 70–99)
Potassium: 4.4 mmol/L (ref 3.5–5.1)
Sodium: 138 mmol/L (ref 135–145)
Total Bilirubin: 1 mg/dL (ref 0.3–1.2)
Total Protein: 7.5 g/dL (ref 6.5–8.1)

## 2019-09-11 MED ORDER — APIXABAN 2.5 MG PO TABS: 2.5000 mg | ORAL_TABLET | Freq: Two times a day (BID) | ORAL | 1 refills | Status: DC

## 2019-09-11 NOTE — Progress Notes (Signed)
Hematology/Oncology follow-up progress note [facility] Telephone:([phone] [phone]) [phone]   Patient Care Team: Gennette Pac, [place] as PCP - General (Family Medicine) End, Harrell Gave, MD as PCP - Cardiology (Cardiology)  REFERRING PROVIDER: Dr.Tahiliani Margretta Sidle CHIEF COMPLAINTS/REASON FOR VISIT:  Follow-up for DVT  HISTORY OF PRESENTING ILLNESS:  Zachary Sweeney is a  74 y.o.  male with PMH listed below who was referred to me for evaluation of DVT Patient recently established care with gastroenterology for consultation and management of chronic history of diffuse abdominal pain as well as epigastric pain.  Not related to meals, exacerbated when he bends down.  Not associated with nausea or vomiting. Denies any weight loss, fever, chills, night sweats.  Appetite is fair. Denies hematochezia, hematuria, hematemesis, epistaxis, black tarry stool or easy bruising.  Endoscopy procedures were recommended for further evaluation.  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  Patient was referred by gastroenterology to me for further evaluation and peri-procedure anticoagulation recommendation. Patient also was found to have an abdominal wall hernia which caused this pain with bending down.  He was also referred to see surgery Dr. Hampton Abbot for further evaluation.  06/25/2017, patient had US venous bilateral lower extremity which showed a focal area of partial compressibility popliteal Vein on the right, compatible with a partially occlusive DVT.  The borders are smooth and this has a subacute appearance. Left lower extremity no DVT.   Patient recalls that he had right foot pain and swelling, as the initial presentation which prompted ultrasound evaluation. Patient was started on Eliquis after ED discussed his case with vascular surgery Dr. Lucky Cowboy. Patient was taken off Eliquis in November 2019. Presented to [facility] emergency room on  03/30/2018 complaining right lower extremity pain and swelling again which is similar to when he had a blood clot.  Patient was concerned that he could have developed blood clots again so he restarted taking Eliquis for the past week prior to presenting to the emergency room.  He mentions to a friend at the church and they urged him to go to emergency room. In the ED, ultrasound showed nonocclusive right lower extremity DVT. 04/10/2018, he had CT abdomen pelvis done for evaluation of chronic abdominal pain.  CT showed no acute findings in the abdomen and pelvis.  Nonobstructive punctate stones over the lower pole collecting system of the right kidney.  Small umbilical hernia containing only peritoneal fat.  Aortic atherosclerosis.  Patient reports feeling well today.  Right lower extremity pain and swelling has improved.  He is taking Eliquis 5 mg twice daily since February 2020.  Tolerates well.  No bleeding events.  #  GI work-up done on 07/24/2018. Colonoscopy showed colon polyps which were resected and retrieved.  Diverticulosis.  Biopsy showed tubular adenoma.  Recommend repeat colonoscopy in 3 years negative for high-grade dysplasia and malignancy. EGD showed salmon-colored mucosa suggestive of Barrett's esophagus.  Biopsied.  Per GI note, biopsy showed evidence of acid reflux.  Patient was started on Pepcid.  #  07/10/2018 US venous lower extremity unilateral right findings compatible with resolving isolated right popliteal DVT. Recommend patient to be switched to Eliquis 2.5 mg twice daily for long-term anticoagulation maintenance.  INTERVAL HISTORY Zachary Sweeney is a 74 y.o. male who has above history reviewed by me today presents for follow up visit for DVT and erythrocytosis.  Problems and complaints are listed below: He takes eliquis 2.5mg  BID. No bleeding events.  He has intermittent right lower  extremity swelling, chronic.     Review of Systems  Constitutional: Negative for appetite  change, chills, fatigue, fever and unexpected weight change.  HENT:   Negative for hearing loss and voice change.   Eyes: Negative for eye problems and icterus.  Respiratory: Negative for chest tightness, cough and shortness of breath.   Cardiovascular: Positive for leg swelling. Negative for chest pain.  Gastrointestinal: Negative for abdominal distention, abdominal pain, nausea and vomiting.  Endocrine: Negative for hot flashes.  Genitourinary: Negative for difficulty urinating, dysuria and frequency.   Musculoskeletal: Negative for arthralgias.  Skin: Negative for itching and rash.  Neurological: Negative for light-headedness and numbness.  Hematological: Negative for adenopathy. Does not bruise/bleed easily.  Psychiatric/Behavioral: Negative for confusion.    MEDICAL HISTORY:  Past Medical History:  Diagnosis Date  . Acid reflux   . Back injury   . Colon polyp   . DVT (deep venous thrombosis) ([place])    a. 06/2017 LE U/S: Right popliteal vein DVT.  Marland Kitchen Dyspnea on exertion    a. 06/2017 CTA Chest: No PE. No significant coronary Ca2+, centrilobular and paraseptal emphysema;  b.  07/2017 Echo: EF 60-65%, no rwma, Gr1 DD, mildly dil LA. Nl RV fxn; c. 07/2017 MV: Small, severe defect involving apical inf and apical segments - only on rest images consistent w/ artifact. No ischemia or scar.  . Emphysema of lung ([place])    a. 06/2017 CTA Chest: Centrilobular and paraseptal emphysema, with mild geographic ground-glass opacity likely representing small airway dzs.  . Erythrocytosis 04/17/2019  . Hypertension   . Sleep apnea   . Torn ligament     SURGICAL HISTORY: Past Surgical History:  Procedure Laterality Date  . COLONOSCOPY  2008  . COLONOSCOPY WITH PROPOFOL N/A 02/27/2017   Procedure: COLONOSCOPY WITH PROPOFOL;  Surgeon: Robert Bellow, MD;  Location: [facility] ENDOSCOPY;  Service: Endoscopy;  Laterality: N/A;  . COLONOSCOPY WITH PROPOFOL N/A 07/24/2018   Procedure: COLONOSCOPY WITH PROPOFOL;   Surgeon: Virgel Manifold, MD;  Location: [facility] ENDOSCOPY;  Service: Endoscopy;  Laterality: N/A;  . ESOPHAGOGASTRODUODENOSCOPY (EGD) WITH PROPOFOL N/A 02/27/2017   Procedure: ESOPHAGOGASTRODUODENOSCOPY (EGD) WITH PROPOFOL;  Surgeon: Robert Bellow, MD;  Location: [facility] ENDOSCOPY;  Service: Endoscopy;  Laterality: N/A;  . ESOPHAGOGASTRODUODENOSCOPY (EGD) WITH PROPOFOL N/A 07/24/2018   Procedure: ESOPHAGOGASTRODUODENOSCOPY (EGD) WITH PROPOFOL;  Surgeon: Virgel Manifold, MD;  Location: [facility] ENDOSCOPY;  Service: Endoscopy;  Laterality: N/A;  . HERNIA REPAIR Right    inguinal  . KNEE SURGERY Left   . NECK SURGERY    . ROTATOR CUFF REPAIR Right   . UMBILICAL HERNIA REPAIR N/A 04/22/2019   Procedure: HERNIA REPAIR UMBILICAL ADULT;  Surgeon: Ronny Bacon, MD;  Location: [facility] ORS;  Service: General;  Laterality: N/A;    SOCIAL HISTORY: Social History   Socioeconomic History  . Marital status: Single    Spouse name: Not on file  . Number of children: Not on file  . Years of education: Not on file  . Highest education level: Not on file  Occupational History  . Occupation: retired  Tobacco Use  . Smoking status: Former Smoker    Packs/day: 0.25    Years: 30.00    Pack years: 7.50    Types: Cigarettes    Quit date: 02/19/1994    Years since quitting: 25.5  . Smokeless tobacco: Never Used  Vaping Use  . Vaping Use: Never used  Substance and Sexual Activity  . Alcohol use: No  . Drug  use: No  . Sexual activity: Not on file  Other Topics Concern  . Not on file  Social History Narrative  . Not on file   Social Determinants of Health   Financial Resource Strain:   . Difficulty of Paying Living Expenses:   Food Insecurity:   . Worried About Charity fundraiser in the Last Year:   . Arboriculturist in the Last Year:   Transportation Needs:   . Film/video editor (Medical):   Marland Kitchen Lack of Transportation (Non-Medical):   Physical Activity:   . Days of Exercise per Week:    . Minutes of Exercise per Session:   Stress:   . Feeling of Stress :   Social Connections:   . Frequency of Communication with Friends and Family:   . Frequency of Social Gatherings with Friends and Family:   . Attends Religious Services:   . Active Member of Clubs or Organizations:   . Attends Archivist Meetings:   Marland Kitchen Marital Status:   Intimate Partner Violence:   . Fear of Current or Ex-Partner:   . Emotionally Abused:   Marland Kitchen Physically Abused:   . Sexually Abused:     FAMILY HISTORY: Family History  Problem Relation Age of Onset  . Lung cancer Mother   . Heart disease Father   . Prostate cancer Maternal Uncle     ALLERGIES:  has No Known Allergies.  MEDICATIONS:  Current Outpatient Medications  Medication Sig Dispense Refill  . albuterol (VENTOLIN HFA) 108 (90 Base) MCG/ACT inhaler SMARTSIG:2 Puff(s) By Mouth Every 4-6 Hours PRN    . apixaban (ELIQUIS) 2.5 MG TABS tablet Take 1 tablet (2.5 mg total) by mouth 2 (two) times daily. 180 tablet 1  . diltiazem (CARDIZEM CD) 300 MG 24 hr capsule Take 1 capsule (300 mg total) by mouth daily. 90 capsule 3  . ketoconazole (NIZORAL) 2 % cream Apply 1 application topically 2 (two) times daily as needed for irritation.     Marland Kitchen omeprazole (PRILOSEC) 20 MG capsule Take 1 capsule (20 mg total) by mouth daily. 30 capsule 6  . SPIRIVA RESPIMAT 2.5 MCG/ACT AERS SMARTSIG:2 Inhalation By Mouth Daily    . clotrimazole (LOTRIMIN) 1 % cream Apply 1 application topically daily.  (Patient not taking: Reported on 09/11/2019)    . ketoconazole (NIZORAL) 2 % shampoo Apply 1 application topically once a week.  (Patient not taking: Reported on 09/11/2019)     No current facility-administered medications for this visit.     PHYSICAL EXAMINATION: ECOG PERFORMANCE STATUS: 0 - Asymptomatic Vitals:   09/11/19 1328  BP: (!) 134/79  Pulse: 67  Resp: 18  Temp: 97.9 F (36.6 C)  SpO2: 99%   Filed Weights   09/11/19 1328  Weight: (!) 303 lb  12.8 oz (137.8 kg)    Physical Exam Constitutional:      General: He is not in acute distress. HENT:     Head: Normocephalic and atraumatic.  Eyes:     General: No scleral icterus.    Pupils: Pupils are equal, round, and reactive to light.  Cardiovascular:     Rate and Rhythm: Normal rate and regular rhythm.     Heart sounds: Normal heart sounds.  Pulmonary:     Effort: Pulmonary effort is normal. No respiratory distress.     Breath sounds: No wheezing.  Abdominal:     General: Bowel sounds are normal. There is no distension.     Palpations: Abdomen is  soft. There is no mass.     Tenderness: There is no abdominal tenderness.  Musculoskeletal:        General: No deformity. Normal range of motion.     Cervical back: Normal range of motion and neck supple.  Skin:    General: Skin is warm and dry.     Findings: No erythema or rash.  Neurological:     Mental Status: He is alert and oriented to person, place, and time. Mental status is at baseline.     Cranial Nerves: No cranial nerve deficit.     Coordination: Coordination normal.  Psychiatric:        Mood and Affect: Mood normal.        Behavior: Behavior normal.        Thought Content: Thought content normal.     RADIOGRAPHIC STUDIES: I have personally reviewed the radiological images as listed and agreed with the findings in the report.  CMP Latest Ref Rng & Units 09/11/2019  Glucose 70 - 99 mg/dL 93  BUN 8 - 23 mg/dL 14  Creatinine 0.61 - 1.24 mg/dL 1.22  Sodium 135 - 145 mmol/L 138  Potassium 3.5 - 5.1 mmol/L 4.4  Chloride 98 - 111 mmol/L 105  CO2 22 - 32 mmol/L 27  Calcium 8.9 - 10.3 mg/dL 9.4  Total Protein 6.5 - 8.1 g/dL 7.5  Total Bilirubin 0.3 - 1.2 mg/dL 1.0  Alkaline Phos 38 - 126 U/L 58  AST 15 - 41 U/L 25  ALT 0 - 44 U/L 22   CBC Latest Ref Rng & Units 09/11/2019  WBC 4.0 - 10.5 K/uL 7.1  Hemoglobin 13.0 - 17.0 g/dL 16.7  Hematocrit 39 - 52 % 48.2  Platelets 150 - 400 K/uL 149(L)    LABORATORY  DATA:  I have reviewed the data as listed Lab Results  Component Value Date   WBC 7.1 09/11/2019   HGB 16.7 09/11/2019   HCT 48.2 09/11/2019   MCV 87.6 09/11/2019   PLT 149 (L) 09/11/2019   Recent Labs    04/17/19 0941 07/13/19 1316 09/11/19 1313  NA 137 140 138  K 3.6 4.0 4.4  CL 106 108 105  CO2 21* 24 27  GLUCOSE 119* 103* 93  BUN 10 13 14   CREATININE 1.04 1.28* 1.22  CALCIUM 9.2 9.2 9.4  GFRNONAA >60 56* 59*  GFRAA >60 >60 >60  PROT 7.9 7.6 7.5  ALBUMIN 4.1 4.2 4.1  AST 25 21 25   ALT 20 19 22   ALKPHOS 50 51 58  BILITOT 1.3* 1.1 1.0   Iron/TIBC/Ferritin/ %Sat No results found for: IRON, TIBC, FERRITIN, IRONPCTSAT      ASSESSMENT & PLAN:  1. History of DVT of lower extremity   2. Chronic anticoagulation   3. Erythrocytosis    #History of recurrent DVT. Continue Eliquis 2.5 mg twice daily for maintenance. Chronic low extremity edema, likely vein sufficiency.  Recommend leg elevation, compression stocking.  #Erythrocytosis, status post phlebotomy at the last visit. Labs are reviewed and discussed with patient.  No need for phlebotomy today. Recommend patient to continue to follow-up with primary care provider to arrange CPAP machine.  He can follow-up in 6 months    Orders Placed This Encounter  Procedures  . CBC with Differential/Platelet    Standing Status:   Future    Standing Expiration Date:   09/10/2020  . Comprehensive metabolic panel    Standing Status:   Future    Standing Expiration Date:  09/10/2020    All questions were answered. The patient knows to call the clinic with any problems questions or concerns.  Earlie Server, MD, PhD Hematology Oncology [facility] Pager- [phone] 09/11/2019

## 2019-09-11 NOTE — Progress Notes (Signed)
No new changes noted today 

## 2020-03-15 ENCOUNTER — Ambulatory Visit: Payer: Medicare Other | Admitting: Oncology

## 2020-03-15 ENCOUNTER — Other Ambulatory Visit: Payer: Medicare Other

## 2020-03-16 ENCOUNTER — Inpatient Hospital Stay: Payer: Medicare Other | Admitting: Oncology

## 2020-03-16 ENCOUNTER — Telehealth: Payer: Self-pay | Admitting: Oncology

## 2020-03-16 ENCOUNTER — Inpatient Hospital Stay: Payer: Medicare Other

## 2020-03-16 NOTE — Telephone Encounter (Signed)
Pt called to r/s appointment for today, pt requests call back to do so.

## 2020-03-17 NOTE — Telephone Encounter (Signed)
Please call pt to r/s

## 2020-03-28 ENCOUNTER — Inpatient Hospital Stay (HOSPITAL_BASED_OUTPATIENT_CLINIC_OR_DEPARTMENT_OTHER): Payer: Medicare Other | Admitting: Oncology

## 2020-03-28 ENCOUNTER — Inpatient Hospital Stay: Payer: Medicare Other

## 2020-03-28 ENCOUNTER — Inpatient Hospital Stay: Payer: Medicare Other | Attending: Oncology

## 2020-03-28 ENCOUNTER — Encounter: Payer: Self-pay | Admitting: Oncology

## 2020-03-28 VITALS — BP 137/84 | HR 66 | Temp 97.5°F | Resp 18 | Wt 298.8 lb

## 2020-03-28 DIAGNOSIS — K219 Gastro-esophageal reflux disease without esophagitis: Secondary | ICD-10-CM | POA: Insufficient documentation

## 2020-03-28 DIAGNOSIS — I10 Essential (primary) hypertension: Secondary | ICD-10-CM | POA: Insufficient documentation

## 2020-03-28 DIAGNOSIS — Z86718 Personal history of other venous thrombosis and embolism: Secondary | ICD-10-CM

## 2020-03-28 DIAGNOSIS — Z7901 Long term (current) use of anticoagulants: Secondary | ICD-10-CM

## 2020-03-28 DIAGNOSIS — Z79899 Other long term (current) drug therapy: Secondary | ICD-10-CM | POA: Insufficient documentation

## 2020-03-28 DIAGNOSIS — D751 Secondary polycythemia: Secondary | ICD-10-CM | POA: Insufficient documentation

## 2020-03-28 DIAGNOSIS — K439 Ventral hernia without obstruction or gangrene: Secondary | ICD-10-CM | POA: Insufficient documentation

## 2020-03-28 DIAGNOSIS — Z8616 Personal history of COVID-19: Secondary | ICD-10-CM | POA: Insufficient documentation

## 2020-03-28 DIAGNOSIS — Z87891 Personal history of nicotine dependence: Secondary | ICD-10-CM | POA: Diagnosis not present

## 2020-03-28 LAB — CBC WITH DIFFERENTIAL/PLATELET
Abs Immature Granulocytes: 0.03 10*3/uL (ref 0.00–0.07)
Basophils Absolute: 0 10*3/uL (ref 0.0–0.1)
Basophils Relative: 1 %
Eosinophils Absolute: 0.4 10*3/uL (ref 0.0–0.5)
Eosinophils Relative: 5 %
HCT: 45.4 % (ref 39.0–52.0)
Hemoglobin: 15.4 g/dL (ref 13.0–17.0)
Immature Granulocytes: 0 %
Lymphocytes Relative: 33 %
Lymphs Abs: 2.4 10*3/uL (ref 0.7–4.0)
MCH: 30.3 pg (ref 26.0–34.0)
MCHC: 33.9 g/dL (ref 30.0–36.0)
MCV: 89.2 fL (ref 80.0–100.0)
Monocytes Absolute: 0.7 10*3/uL (ref 0.1–1.0)
Monocytes Relative: 9 %
Neutro Abs: 3.8 10*3/uL (ref 1.7–7.7)
Neutrophils Relative %: 52 %
Platelets: 153 10*3/uL (ref 150–400)
RBC: 5.09 MIL/uL (ref 4.22–5.81)
RDW: 14 % (ref 11.5–15.5)
WBC: 7.3 10*3/uL (ref 4.0–10.5)
nRBC: 0 % (ref 0.0–0.2)

## 2020-03-28 LAB — COMPREHENSIVE METABOLIC PANEL
ALT: 24 U/L (ref 0–44)
AST: 23 U/L (ref 15–41)
Albumin: 3.5 g/dL (ref 3.5–5.0)
Alkaline Phosphatase: 60 U/L (ref 38–126)
Anion gap: 7 (ref 5–15)
BUN: 8 mg/dL (ref 8–23)
CO2: 26 mmol/L (ref 22–32)
Calcium: 9.1 mg/dL (ref 8.9–10.3)
Chloride: 104 mmol/L (ref 98–111)
Creatinine, Ser: 1.12 mg/dL (ref 0.61–1.24)
GFR, Estimated: >60 mL/min (ref >60)
Glucose, Bld: 109 mg/dL — ABNORMAL HIGH (ref 70–99)
Potassium: 3.8 mmol/L (ref 3.5–5.1)
Sodium: 137 mmol/L (ref 135–145)
Total Bilirubin: 0.7 mg/dL (ref 0.3–1.2)
Total Protein: 7.1 g/dL (ref 6.5–8.1)

## 2020-03-28 MED ORDER — APIXABAN 2.5 MG PO TABS: 2.5000 mg | ORAL_TABLET | Freq: Two times a day (BID) | ORAL | 1 refills | Status: DC

## 2020-03-28 NOTE — Progress Notes (Signed)
Patient reports having COVID 3 weeks ago.

## 2020-03-28 NOTE — Progress Notes (Signed)
Hematology/Oncology follow-up progress note [facility] Telephone:([phone] [phone]) [phone]   Patient Care Team: Gennette Pac, [place] as PCP - General (Family Medicine) End, Harrell Gave, MD as PCP - Cardiology (Cardiology)  REFERRING PROVIDER: Dr.Tahiliani Margretta Sidle CHIEF COMPLAINTS/REASON FOR VISIT:  Follow-up for DVT  HISTORY OF PRESENTING ILLNESS:  Zachary Sweeney is a  75 y.o.  male with PMH listed below who was referred to me for evaluation of DVT Patient recently established care with gastroenterology for consultation and management of chronic history of diffuse abdominal pain as well as epigastric pain.  Not related to meals, exacerbated when he bends down.  Not associated with nausea or vomiting. Denies any weight loss, fever, chills, night sweats.  Appetite is fair. Denies hematochezia, hematuria, hematemesis, epistaxis, black tarry stool or easy bruising.  Endoscopy procedures were recommended for further evaluation.  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  Patient was referred by gastroenterology to me for further evaluation and peri-procedure anticoagulation recommendation. Patient also was found to have an abdominal wall hernia which caused this pain with bending down.  He was also referred to see surgery Dr. Hampton Abbot for further evaluation.  06/25/2017, patient had US venous bilateral lower extremity which showed a focal area of partial compressibility popliteal Vein on the right, compatible with a partially occlusive DVT.  The borders are smooth and this has a subacute appearance. Left lower extremity no DVT.   Patient recalls that he had right foot pain and swelling, as the initial presentation which prompted ultrasound evaluation. Patient was started on Eliquis after ED discussed his case with vascular surgery Dr. Lucky Cowboy. Patient was taken off Eliquis in November 2019. Presented to [facility] emergency room on  03/30/2018 complaining right lower extremity pain and swelling again which is similar to when he had a blood clot.  Patient was concerned that he could have developed blood clots again so he restarted taking Eliquis for the past week prior to presenting to the emergency room.  He mentions to a friend at the church and they urged him to go to emergency room. In the ED, ultrasound showed nonocclusive right lower extremity DVT. 04/10/2018, he had CT abdomen pelvis done for evaluation of chronic abdominal pain.  CT showed no acute findings in the abdomen and pelvis.  Nonobstructive punctate stones over the lower pole collecting system of the right kidney.  Small umbilical hernia containing only peritoneal fat.  Aortic atherosclerosis.  Patient reports feeling well today.  Right lower extremity pain and swelling has improved.  He is taking Eliquis 5 mg twice daily since February 2020.  Tolerates well.  No bleeding events.  #  GI work-up done on 07/24/2018. Colonoscopy showed colon polyps which were resected and retrieved.  Diverticulosis.  Biopsy showed tubular adenoma.  Recommend repeat colonoscopy in 3 years negative for high-grade dysplasia and malignancy. EGD showed salmon-colored mucosa suggestive of Barrett's esophagus.  Biopsied.  Per GI note, biopsy showed evidence of acid reflux.  Patient was started on Pepcid.  #  07/10/2018 US venous lower extremity unilateral right findings compatible with resolving isolated right popliteal DVT. Recommend patient to be switched to Eliquis 2.5 mg twice daily for long-term anticoagulation maintenance.  INTERVAL HISTORY Zachary Sweeney is a 75 y.o. male who has above history reviewed by me today presents for follow up visit for DVT and erythrocytosis.  Problems and complaints are listed below: He takes eliquis 2.5mg  BID. No bleeding events.  He has intermittent right lower  extremity swelling, chronic.  Patient had COVID-19 infection 3 weeks ago.  Today he denies  shortness of breath, fever or chills. He has not received his CPAP machine due to the supply chain shortage.  Review of Systems  Constitutional: Negative for appetite change, chills, fatigue, fever and unexpected weight change.  HENT:   Negative for hearing loss and voice change.   Eyes: Negative for eye problems and icterus.  Respiratory: Negative for chest tightness, cough and shortness of breath.   Cardiovascular: Positive for leg swelling. Negative for chest pain.  Gastrointestinal: Negative for abdominal distention, abdominal pain, nausea and vomiting.  Endocrine: Negative for hot flashes.  Genitourinary: Negative for difficulty urinating, dysuria and frequency.   Musculoskeletal: Negative for arthralgias.  Skin: Negative for itching and rash.  Neurological: Negative for light-headedness and numbness.  Hematological: Negative for adenopathy. Does not bruise/bleed easily.  Psychiatric/Behavioral: Negative for confusion.    MEDICAL HISTORY:  Past Medical History:  Diagnosis Date  . Acid reflux   . Back injury   . Colon polyp   . DVT (deep venous thrombosis) ([place])    a. 06/2017 LE U/S: Right popliteal vein DVT.  Marland Kitchen Dyspnea on exertion    a. 06/2017 CTA Chest: No PE. No significant coronary Ca2+, centrilobular and paraseptal emphysema;  b.  07/2017 Echo: EF 60-65%, no rwma, Gr1 DD, mildly dil LA. Nl RV fxn; c. 07/2017 MV: Small, severe defect involving apical inf and apical segments - only on rest images consistent w/ artifact. No ischemia or scar.  . Emphysema of lung ([place])    a. 06/2017 CTA Chest: Centrilobular and paraseptal emphysema, with mild geographic ground-glass opacity likely representing small airway dzs.  . Erythrocytosis 04/17/2019  . Hypertension   . Sleep apnea   . Torn ligament     SURGICAL HISTORY: Past Surgical History:  Procedure Laterality Date  . COLONOSCOPY  2008  . COLONOSCOPY WITH PROPOFOL N/A 02/27/2017   Procedure: COLONOSCOPY WITH PROPOFOL;  Surgeon:  Robert Bellow, MD;  Location: [facility] ENDOSCOPY;  Service: Endoscopy;  Laterality: N/A;  . COLONOSCOPY WITH PROPOFOL N/A 07/24/2018   Procedure: COLONOSCOPY WITH PROPOFOL;  Surgeon: Virgel Manifold, MD;  Location: [facility] ENDOSCOPY;  Service: Endoscopy;  Laterality: N/A;  . ESOPHAGOGASTRODUODENOSCOPY (EGD) WITH PROPOFOL N/A 02/27/2017   Procedure: ESOPHAGOGASTRODUODENOSCOPY (EGD) WITH PROPOFOL;  Surgeon: Robert Bellow, MD;  Location: [facility] ENDOSCOPY;  Service: Endoscopy;  Laterality: N/A;  . ESOPHAGOGASTRODUODENOSCOPY (EGD) WITH PROPOFOL N/A 07/24/2018   Procedure: ESOPHAGOGASTRODUODENOSCOPY (EGD) WITH PROPOFOL;  Surgeon: Virgel Manifold, MD;  Location: [facility] ENDOSCOPY;  Service: Endoscopy;  Laterality: N/A;  . HERNIA REPAIR Right    inguinal  . KNEE SURGERY Left   . NECK SURGERY    . ROTATOR CUFF REPAIR Right   . UMBILICAL HERNIA REPAIR N/A 04/22/2019   Procedure: HERNIA REPAIR UMBILICAL ADULT;  Surgeon: Ronny Bacon, MD;  Location: [facility] ORS;  Service: General;  Laterality: N/A;    SOCIAL HISTORY: Social History   Socioeconomic History  . Marital status: Single    Spouse name: Not on file  . Number of children: Not on file  . Years of education: Not on file  . Highest education level: Not on file  Occupational History  . Occupation: retired  Tobacco Use  . Smoking status: Former Smoker    Packs/day: 0.25    Years: 30.00    Pack years: 7.50    Types: Cigarettes    Quit date: 02/19/1994    Years since quitting: 26.1  .  Smokeless tobacco: Never Used  Vaping Use  . Vaping Use: Never used  Substance and Sexual Activity  . Alcohol use: No  . Drug use: No  . Sexual activity: Not on file  Other Topics Concern  . Not on file  Social History Narrative  . Not on file   Social Determinants of Health   Financial Resource Strain: Not on file  Food Insecurity: Not on file  Transportation Needs: Not on file  Physical Activity: Not on file  Stress: Not on file  Social  Connections: Not on file  Intimate Partner Violence: Not on file    FAMILY HISTORY: Family History  Problem Relation Age of Onset  . Lung cancer Mother   . Heart disease Father   . Prostate cancer Maternal Uncle     ALLERGIES:  has No Known Allergies.  MEDICATIONS:  Current Outpatient Medications  Medication Sig Dispense Refill  . albuterol (VENTOLIN HFA) 108 (90 Base) MCG/ACT inhaler SMARTSIG:2 Puff(s) By Mouth Every 4-6 Hours PRN    . apixaban (ELIQUIS) 2.5 MG TABS tablet Take 1 tablet (2.5 mg total) by mouth 2 (two) times daily. 180 tablet 1  . clotrimazole (LOTRIMIN) 1 % cream Apply 1 application topically daily.  (Patient not taking: Reported on 09/11/2019)    . diltiazem (CARDIZEM CD) 300 MG 24 hr capsule Take 1 capsule (300 mg total) by mouth daily. 90 capsule 3  . ketoconazole (NIZORAL) 2 % cream Apply 1 application topically 2 (two) times daily as needed for irritation.     Marland Kitchen ketoconazole (NIZORAL) 2 % shampoo Apply 1 application topically once a week.  (Patient not taking: Reported on 09/11/2019)    . omeprazole (PRILOSEC) 20 MG capsule Take 1 capsule (20 mg total) by mouth daily. 30 capsule 6  . SPIRIVA RESPIMAT 2.5 MCG/ACT AERS SMARTSIG:2 Inhalation By Mouth Daily     No current facility-administered medications for this visit.     PHYSICAL EXAMINATION: ECOG PERFORMANCE STATUS: 0 - Asymptomatic Vitals:   03/28/20 1057  BP: 137/84  Pulse: 66  Resp: 18  Temp: (!) 97.5 F (36.4 C)   Filed Weights   03/28/20 1057  Weight: 298 lb 12.8 oz (135.5 kg)    Physical Exam Constitutional:      General: He is not in acute distress. HENT:     Head: Normocephalic and atraumatic.  Eyes:     General: No scleral icterus.    Pupils: Pupils are equal, round, and reactive to light.  Cardiovascular:     Rate and Rhythm: Normal rate and regular rhythm.     Heart sounds: Normal heart sounds.  Pulmonary:     Effort: Pulmonary effort is normal. No respiratory distress.      Breath sounds: No wheezing.  Abdominal:     General: Bowel sounds are normal. There is no distension.     Palpations: Abdomen is soft. There is no mass.     Tenderness: There is no abdominal tenderness.  Musculoskeletal:        General: No deformity. Normal range of motion.     Cervical back: Normal range of motion and neck supple.  Skin:    General: Skin is warm and dry.     Findings: No erythema or rash.  Neurological:     Mental Status: He is alert and oriented to person, place, and time. Mental status is at baseline.     Cranial Nerves: No cranial nerve deficit.     Coordination: Coordination normal.  Psychiatric:        Mood and Affect: Mood normal.        Behavior: Behavior normal.        Thought Content: Thought content normal.     RADIOGRAPHIC STUDIES: I have personally reviewed the radiological images as listed and agreed with the findings in the report.  CMP Latest Ref Rng & Units 09/11/2019  Glucose 70 - 99 mg/dL 93  BUN 8 - 23 mg/dL 14  Creatinine 0.61 - 1.24 mg/dL 1.22  Sodium 135 - 145 mmol/L 138  Potassium 3.5 - 5.1 mmol/L 4.4  Chloride 98 - 111 mmol/L 105  CO2 22 - 32 mmol/L 27  Calcium 8.9 - 10.3 mg/dL 9.4  Total Protein 6.5 - 8.1 g/dL 7.5  Total Bilirubin 0.3 - 1.2 mg/dL 1.0  Alkaline Phos 38 - 126 U/L 58  AST 15 - 41 U/L 25  ALT 0 - 44 U/L 22   CBC Latest Ref Rng & Units 09/11/2019  WBC 4.0 - 10.5 K/uL 7.1  Hemoglobin 13.0 - 17.0 g/dL 16.7  Hematocrit 39.0 - 52.0 % 48.2  Platelets 150 - 400 K/uL 149(L)    LABORATORY DATA:  I have reviewed the data as listed Lab Results  Component Value Date   WBC 7.1 09/11/2019   HGB 16.7 09/11/2019   HCT 48.2 09/11/2019   MCV 87.6 09/11/2019   PLT 149 (L) 09/11/2019   Recent Labs    04/17/19 0941 07/13/19 1316 09/11/19 1313  NA 137 140 138  K 3.6 4.0 4.4  CL 106 108 105  CO2 21* 24 27  GLUCOSE 119* 103* 93  BUN 10 13 14   CREATININE 1.04 1.28* 1.22  CALCIUM 9.2 9.2 9.4  GFRNONAA >60 56* 59*   GFRAA >60 >60 >60  PROT 7.9 7.6 7.5  ALBUMIN 4.1 4.2 4.1  AST 25 21 25   ALT 20 19 22   ALKPHOS 50 51 58  BILITOT 1.3* 1.1 1.0   Iron/TIBC/Ferritin/ %Sat No results found for: IRON, TIBC, FERRITIN, IRONPCTSAT      ASSESSMENT & PLAN:  1. History of DVT of lower extremity   2. Chronic anticoagulation    #History of recurrent DVT. Continue Eliquis 2.5 mg twice daily for maintenance.  He tolerates well. Chronic low extremity edema, likely vein sufficiency.  Recommend leg elevation, compression stocking.  #Erythrocytosis, status post phlebotomy at the last visit.  Labs reviewed and discussed with patient  No need for phlebotomy today.   Encourage patient to get and start use CPAP machine once available to him.Marland Kitchen  He can follow-up in 6 months    Orders Placed This Encounter  Procedures  . CBC with Differential/Platelet    Standing Status:   Future    Standing Expiration Date:   03/28/2021  . Comprehensive metabolic panel    Standing Status:   Future    Standing Expiration Date:   03/28/2021    All questions were answered. The patient knows to call the clinic with any problems questions or concerns.  Earlie Server, MD, PhD Hematology Oncology [facility] Pager- [phone] 03/28/2020

## 2020-05-25 ENCOUNTER — Ambulatory Visit (INDEPENDENT_AMBULATORY_CARE_PROVIDER_SITE_OTHER): Payer: Medicare Other | Admitting: Podiatry

## 2020-05-25 ENCOUNTER — Encounter: Payer: Self-pay | Admitting: Podiatry

## 2020-05-25 ENCOUNTER — Other Ambulatory Visit: Payer: Self-pay

## 2020-05-25 DIAGNOSIS — B351 Tinea unguium: Secondary | ICD-10-CM | POA: Diagnosis not present

## 2020-05-25 DIAGNOSIS — Z7901 Long term (current) use of anticoagulants: Secondary | ICD-10-CM

## 2020-05-25 DIAGNOSIS — B353 Tinea pedis: Secondary | ICD-10-CM | POA: Diagnosis not present

## 2020-05-25 DIAGNOSIS — L309 Dermatitis, unspecified: Secondary | ICD-10-CM | POA: Diagnosis not present

## 2020-05-25 DIAGNOSIS — M79674 Pain in right toe(s): Secondary | ICD-10-CM

## 2020-05-25 DIAGNOSIS — M79675 Pain in left toe(s): Secondary | ICD-10-CM

## 2020-05-25 MED ORDER — CLOTRIMAZOLE-BETAMETHASONE 1-0.05 % EX CREA: 1.0000 "application " | TOPICAL_CREAM | Freq: Two times a day (BID) | CUTANEOUS | 2 refills | Status: DC

## 2020-05-25 NOTE — Progress Notes (Signed)
  Subjective:  Patient ID: Zachary Sweeney, male    DOB: 13-Jan-1946,  MRN: [id number]  Chief Complaint  Patient presents with  . Nail Problem    Nail trim RFC    75 y.o. male presents with the above complaint. History confirmed with patient.  Has thickened elongated toenails he cannot cut.  Also a rash on the top of the foot  Objective:  Physical Exam: warm, good capillary refill, no trophic changes or ulcerative lesions, normal DP and PT pulses and normal sensory exam.  Thickened elongated toenails with yellow discoloration, subungual debris and pain.  Also has a dry eczematous rash on the dorsal and plantar foot  Assessment:   1. Onychomycosis   2. Pain due to onychomycosis of toenails of both feet   3. Long term (current) use of anticoagulants      Plan:  Patient was evaluated and treated and all questions answered.  Discussed the etiology and treatment options for the condition in detail with the patient. Educated patient on the topical and oral treatment options for mycotic nails. Recommended debridement of the nails today. Sharp and mechanical debridement performed of all painful and mycotic nails today. Nails debrided in length and thickness using a nail nipper to level of comfort. Discussed treatment options including appropriate shoe gear. Follow up as needed for painful nails.  Discussed the etiology and treatment options for tinea pedis.  I believe this is what this rash is.  If not improving next visit with clotrimazole could consider scraping or biopsy.  Discussed topical and oral treatment.  Recommended topical treatment with 2% ketoconazole cream.  This was sent to the patient's pharmacy.  Also discussed appropriate foot hygiene, use of antifungal spray such as Tinactin in shoes, as well as cleaning her foot surfaces such as showers and bathroom floors with bleach.   Return in about 3 months (around 08/24/2020) for routine foot care, painful nails.

## 2020-08-29 ENCOUNTER — Ambulatory Visit: Payer: Medicare Other | Admitting: Podiatry

## 2020-09-01 ENCOUNTER — Ambulatory Visit (INDEPENDENT_AMBULATORY_CARE_PROVIDER_SITE_OTHER): Payer: Medicare Other | Admitting: Podiatry

## 2020-09-01 ENCOUNTER — Other Ambulatory Visit: Payer: Self-pay

## 2020-09-01 ENCOUNTER — Encounter: Payer: Self-pay | Admitting: Podiatry

## 2020-09-01 DIAGNOSIS — B351 Tinea unguium: Secondary | ICD-10-CM

## 2020-09-01 DIAGNOSIS — M79675 Pain in left toe(s): Secondary | ICD-10-CM

## 2020-09-01 DIAGNOSIS — Z7901 Long term (current) use of anticoagulants: Secondary | ICD-10-CM

## 2020-09-01 DIAGNOSIS — M79674 Pain in right toe(s): Secondary | ICD-10-CM | POA: Diagnosis not present

## 2020-09-01 NOTE — Progress Notes (Signed)
This patient returns to my office for at risk foot care.  This patient requires this care by a professional since this patient will be at risk due to having coagulation defect.  This patient is unable to cut nails himself since the patient cannot reach his nails.These nails are painful walking and wearing shoes.  This patient presents for at risk foot care today.  General Appearance  Alert, conversant and in no acute stress.  Vascular  Dorsalis pedis and posterior tibial  pulses are palpable  bilaterally.  Capillary return is within normal limits  bilaterally. Temperature is within normal limits  bilaterally.  Neurologic  Senn-Weinstein monofilament wire test within normal limits  bilaterally. Muscle power within normal limits bilaterally.  Nails Thick disfigured discolored nails with subungual debris  from hallux to fifth toes bilaterally. No evidence of bacterial infection or drainage bilaterally.  Orthopedic  No limitations of motion  feet .  No crepitus or effusions noted.  No bony pathology or digital deformities noted.  Skin  normotropic skin with no porokeratosis noted bilaterally.  No signs of infections or ulcers noted.     Onychomycosis  Pain in right toes  Pain in left toes  Consent was obtained for treatment procedures.   Mechanical debridement of nails 1-5  bilaterally performed with a nail nipper.  Filed with dremel without incident.    Return office visit    3 months                  Told patient to return for periodic foot care and evaluation due to potential at risk complications.   Gardiner Barefoot DPM

## 2020-09-26 ENCOUNTER — Inpatient Hospital Stay (HOSPITAL_BASED_OUTPATIENT_CLINIC_OR_DEPARTMENT_OTHER): Payer: Medicare Other | Admitting: Oncology

## 2020-09-26 ENCOUNTER — Inpatient Hospital Stay: Payer: Medicare Other | Attending: Oncology

## 2020-09-26 ENCOUNTER — Encounter: Payer: Self-pay | Admitting: Oncology

## 2020-09-26 ENCOUNTER — Inpatient Hospital Stay: Payer: Medicare Other

## 2020-09-26 VITALS — BP 173/90 | HR 73 | Temp 96.0°F | Resp 18 | Wt 305.0 lb

## 2020-09-26 DIAGNOSIS — Z79899 Other long term (current) drug therapy: Secondary | ICD-10-CM | POA: Diagnosis not present

## 2020-09-26 DIAGNOSIS — Z86718 Personal history of other venous thrombosis and embolism: Secondary | ICD-10-CM | POA: Insufficient documentation

## 2020-09-26 DIAGNOSIS — D751 Secondary polycythemia: Secondary | ICD-10-CM

## 2020-09-26 DIAGNOSIS — Z7901 Long term (current) use of anticoagulants: Secondary | ICD-10-CM

## 2020-09-26 LAB — COMPREHENSIVE METABOLIC PANEL
ALT: 20 U/L (ref 0–44)
AST: 27 U/L (ref 15–41)
Albumin: 4 g/dL (ref 3.5–5.0)
Alkaline Phosphatase: 49 U/L (ref 38–126)
Anion gap: 6 (ref 5–15)
BUN: 9 mg/dL (ref 8–23)
CO2: 28 mmol/L (ref 22–32)
Calcium: 8.7 mg/dL — ABNORMAL LOW (ref 8.9–10.3)
Chloride: 102 mmol/L (ref 98–111)
Creatinine, Ser: 1.25 mg/dL — ABNORMAL HIGH (ref 0.61–1.24)
GFR, Estimated: >60 mL/min (ref >60)
Glucose, Bld: 95 mg/dL (ref 70–99)
Potassium: 3.8 mmol/L (ref 3.5–5.1)
Sodium: 136 mmol/L (ref 135–145)
Total Bilirubin: 0.7 mg/dL (ref 0.3–1.2)
Total Protein: 7.3 g/dL (ref 6.5–8.1)

## 2020-09-26 LAB — CBC WITH DIFFERENTIAL/PLATELET
Abs Immature Granulocytes: 0.02 10*3/uL (ref 0.00–0.07)
Basophils Absolute: 0 10*3/uL (ref 0.0–0.1)
Basophils Relative: 1 %
Eosinophils Absolute: 0.3 10*3/uL (ref 0.0–0.5)
Eosinophils Relative: 4 %
HCT: 49 % (ref 39.0–52.0)
Hemoglobin: 16.8 g/dL (ref 13.0–17.0)
Immature Granulocytes: 0 %
Lymphocytes Relative: 37 %
Lymphs Abs: 2.3 10*3/uL (ref 0.7–4.0)
MCH: 30.3 pg (ref 26.0–34.0)
MCHC: 34.3 g/dL (ref 30.0–36.0)
MCV: 88.3 fL (ref 80.0–100.0)
Monocytes Absolute: 0.5 10*3/uL (ref 0.1–1.0)
Monocytes Relative: 9 %
Neutro Abs: 3 10*3/uL (ref 1.7–7.7)
Neutrophils Relative %: 49 %
Platelets: 143 10*3/uL — ABNORMAL LOW (ref 150–400)
RBC: 5.55 MIL/uL (ref 4.22–5.81)
RDW: 14.2 % (ref 11.5–15.5)
WBC: 6.1 10*3/uL (ref 4.0–10.5)
nRBC: 0 % (ref 0.0–0.2)

## 2020-09-26 MED ORDER — APIXABAN 2.5 MG PO TABS: 2.5000 mg | ORAL_TABLET | Freq: Two times a day (BID) | ORAL | 1 refills | Status: DC

## 2020-09-26 NOTE — Progress Notes (Signed)
Patient here for oncology follow-up appointment, concerns of left leg swelling and SOB

## 2020-09-26 NOTE — Progress Notes (Signed)
Siwtch t Hematology/Oncology follow-up progress note [facility] [phone] [phone]   Patient Care Team: Gennette Pac, FNP as PCP - General (Family Medicine) End, Harrell Gave, MD as PCP - Cardiology (Cardiology)  REFERRING PROVIDER: Dr.Tahiliani Margretta Sidle CHIEF COMPLAINTS/REASON FOR VISIT:  Follow-up for DVT  HISTORY OF PRESENTING ILLNESS:  Zachary Sweeney is a  75 y.o.  male with PMH listed below who was referred to me for evaluation of DVT Patient recently established care with gastroenterology for consultation and management of chronic history of diffuse abdominal pain as well as epigastric pain.  Not related to meals, exacerbated when he bends down.  Not associated with nausea or vomiting. Denies any weight loss, fever, chills, night sweats.  Appetite is fair. Denies hematochezia, hematuria, hematemesis, epistaxis, black tarry stool or easy bruising.  Endoscopy procedures were recommended for further evaluation.  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  Patient was referred by gastroenterology to me for further evaluation and peri-procedure anticoagulation recommendation. Patient also was found to have an abdominal wall hernia which caused this pain with bending down.  He was also referred to see surgery Dr. Hampton Abbot for further evaluation.  06/25/2017, patient had US venous bilateral lower extremity which showed a focal area of partial compressibility popliteal Vein on the right, compatible with a partially occlusive DVT.  The borders are smooth and this has a subacute appearance. Left lower extremity no DVT.   Patient recalls that he had right foot pain and swelling, as the initial presentation which prompted ultrasound evaluation. Patient was started on Eliquis after ED discussed his case with vascular surgery Dr. Lucky Cowboy. Patient was taken off Eliquis in November 2019. Presented to [facility] emergency room on  03/30/2018 complaining right lower extremity pain and swelling again which is similar to when he had a blood clot.  Patient was concerned that he could have developed blood clots again so he restarted taking Eliquis for the past week prior to presenting to the emergency room.  He mentions to a friend at the church and they urged him to go to emergency room. In the ED, ultrasound showed nonocclusive right lower extremity DVT. 04/10/2018, he had CT abdomen pelvis done for evaluation of chronic abdominal pain.  CT showed no acute findings in the abdomen and pelvis.  Nonobstructive punctate stones over the lower pole collecting system of the right kidney.  Small umbilical hernia containing only peritoneal fat.  Aortic atherosclerosis.  Patient reports feeling well today.  Right lower extremity pain and swelling has improved.  He is taking Eliquis 5 mg twice daily since February 2020.  Tolerates well.  No bleeding events.  #  GI work-up done on 07/24/2018. Colonoscopy showed colon polyps which were resected and retrieved.  Diverticulosis.  Biopsy showed tubular adenoma.  Recommend repeat colonoscopy in 3 years negative for high-grade dysplasia and malignancy. EGD showed salmon-colored mucosa suggestive of Barrett's esophagus.  Biopsied.  Per GI note, biopsy showed evidence of acid reflux.  Patient was started on Pepcid.  #  07/10/2018 US venous lower extremity unilateral right findings compatible with resolving isolated right popliteal DVT. Recommend patient to be switched to Eliquis 2.5 mg twice daily for long-term anticoagulation maintenance.  INTERVAL HISTORY Zachary Sweeney is a 75 y.o. male who has above history reviewed by me today presents for follow up visit for DVT and erythrocytosis.  Problems and complaints are listed below: He takes eliquis 2.'5mg'$  BID. No bleeding events.  He has intermittent  right lower extremity swelling chronic.  Today he noticed some left lower extremity swelling as well.  No  calf tenderness or erythema.   Patient has sleep apnea, he reports that he has not received his CPAP  Due to supply chain shortage.  Patient reports that it was packed ordered twice.   Review of Systems  Constitutional:  Negative for appetite change, chills, fatigue, fever and unexpected weight change.  HENT:   Negative for hearing loss and voice change.   Eyes:  Negative for eye problems and icterus.  Respiratory:  Negative for chest tightness, cough and shortness of breath.   Cardiovascular:  Positive for leg swelling. Negative for chest pain.  Gastrointestinal:  Negative for abdominal distention, abdominal pain, nausea and vomiting.  Endocrine: Negative for hot flashes.  Genitourinary:  Negative for difficulty urinating, dysuria and frequency.   Musculoskeletal:  Negative for arthralgias.  Skin:  Negative for itching and rash.  Neurological:  Negative for light-headedness and numbness.  Hematological:  Negative for adenopathy. Does not bruise/bleed easily.  Psychiatric/Behavioral:  Negative for confusion.    MEDICAL HISTORY:  Past Medical History:  Diagnosis Date   Acid reflux    Back injury    Colon polyp    DVT (deep venous thrombosis) ([place])    a. 06/2017 LE U/S: Right popliteal vein DVT.   Dyspnea on exertion    a. 06/2017 CTA Chest: No PE. No significant coronary Ca2+, centrilobular and paraseptal emphysema;  b.  07/2017 Echo: EF 60-65%, no rwma, Gr1 DD, mildly dil LA. Nl RV fxn; c. 07/2017 MV: Small, severe defect involving apical inf and apical segments - only on rest images consistent w/ artifact. No ischemia or scar.   Emphysema of lung ([place])    a. 06/2017 CTA Chest: Centrilobular and paraseptal emphysema, with mild geographic ground-glass opacity likely representing small airway dzs.   Erythrocytosis 04/17/2019   Hypertension    Sleep apnea    Torn ligament     SURGICAL HISTORY: Past Surgical History:  Procedure Laterality Date   COLONOSCOPY  2008   COLONOSCOPY WITH  PROPOFOL N/A 02/27/2017   Procedure: COLONOSCOPY WITH PROPOFOL;  Surgeon: Robert Bellow, MD;  Location: [facility] ENDOSCOPY;  Service: Endoscopy;  Laterality: N/A;   COLONOSCOPY WITH PROPOFOL N/A 07/24/2018   Procedure: COLONOSCOPY WITH PROPOFOL;  Surgeon: Virgel Manifold, MD;  Location: [facility] ENDOSCOPY;  Service: Endoscopy;  Laterality: N/A;   ESOPHAGOGASTRODUODENOSCOPY (EGD) WITH PROPOFOL N/A 02/27/2017   Procedure: ESOPHAGOGASTRODUODENOSCOPY (EGD) WITH PROPOFOL;  Surgeon: Robert Bellow, MD;  Location: [facility] ENDOSCOPY;  Service: Endoscopy;  Laterality: N/A;   ESOPHAGOGASTRODUODENOSCOPY (EGD) WITH PROPOFOL N/A 07/24/2018   Procedure: ESOPHAGOGASTRODUODENOSCOPY (EGD) WITH PROPOFOL;  Surgeon: Virgel Manifold, MD;  Location: [facility] ENDOSCOPY;  Service: Endoscopy;  Laterality: N/A;   HERNIA REPAIR Right    inguinal   KNEE SURGERY Left    NECK SURGERY     ROTATOR CUFF REPAIR Right    UMBILICAL HERNIA REPAIR N/A 04/22/2019   Procedure: HERNIA REPAIR UMBILICAL ADULT;  Surgeon: Ronny Bacon, MD;  Location: [facility] ORS;  Service: General;  Laterality: N/A;    SOCIAL HISTORY: Social History   Socioeconomic History   Marital status: Single    Spouse name: Not on file   Number of children: Not on file   Years of education: Not on file   Highest education level: Not on file  Occupational History   Occupation: retired  Tobacco Use   Smoking status: Former    Packs/day: 0.25  Years: 30.00    Pack years: 7.50    Types: Cigarettes    Quit date: 02/19/1994    Years since quitting: 26.6   Smokeless tobacco: Never  Vaping Use   Vaping Use: Never used  Substance and Sexual Activity   Alcohol use: No   Drug use: No   Sexual activity: Not on file  Other Topics Concern   Not on file  Social History Narrative   Not on file   Social Determinants of Health   Financial Resource Strain: Not on file  Food Insecurity: Not on file  Transportation Needs: Not on file  Physical Activity: Not on  file  Stress: Not on file  Social Connections: Not on file  Intimate Partner Violence: Not on file    FAMILY HISTORY: Family History  Problem Relation Age of Onset   Lung cancer Mother    Heart disease Father    Prostate cancer Maternal Uncle     ALLERGIES:  has No Known Allergies.  MEDICATIONS:  Current Outpatient Medications  Medication Sig Dispense Refill   albuterol (VENTOLIN HFA) 108 (90 Base) MCG/ACT inhaler SMARTSIG:2 Puff(s) By Mouth Every 4-6 Hours PRN     clotrimazole-betamethasone (LOTRISONE) cream Apply 1 application topically 2 (two) times daily. 45 g 2   diltiazem (CARDIZEM CD) 300 MG 24 hr capsule Take 1 capsule (300 mg total) by mouth daily. 90 capsule 3   ketoconazole (NIZORAL) 2 % cream Apply 1 application topically 2 (two) times daily as needed for irritation.      omeprazole (PRILOSEC) 20 MG capsule Take 1 capsule (20 mg total) by mouth daily. 30 capsule 6   umeclidinium-vilanterol (ANORO ELLIPTA) 62.5-25 MCG/INH AEPB Inhale into the lungs.     apixaban (ELIQUIS) 2.5 MG TABS tablet Take 1 tablet (2.5 mg total) by mouth 2 (two) times daily. 180 tablet 1   clotrimazole (LOTRIMIN) 1 % cream Apply 1 application topically daily.  (Patient not taking: No sig reported)     ketoconazole (NIZORAL) 2 % shampoo Apply 1 application topically once a week.  (Patient not taking: No sig reported)     No current facility-administered medications for this visit.     PHYSICAL EXAMINATION: ECOG PERFORMANCE STATUS: 0 - Asymptomatic Vitals:   09/26/20 1319  BP: (!) 173/90  Pulse: 73  Resp: 18  Temp: (!) 96 F (35.6 C)  SpO2: 98%   Filed Weights   09/26/20 1319  Weight: (!) 305 lb (138.3 kg)    Physical Exam Constitutional:      General: He is not in acute distress.    Appearance: He is obese.  HENT:     Head: Normocephalic and atraumatic.  Eyes:     General: No scleral icterus.    Pupils: Pupils are equal, round, and reactive to light.  Cardiovascular:      Rate and Rhythm: Normal rate and regular rhythm.     Heart sounds: Normal heart sounds.  Pulmonary:     Effort: Pulmonary effort is normal. No respiratory distress.     Breath sounds: No wheezing.  Abdominal:     General: Bowel sounds are normal. There is no distension.     Palpations: Abdomen is soft. There is no mass.     Tenderness: There is no abdominal tenderness.  Musculoskeletal:        General: No deformity. Normal range of motion.     Cervical back: Normal range of motion and neck supple.     Comments: Trace bilateral  lower extremity edema.   Skin:    General: Skin is warm and dry.     Findings: No erythema or rash.  Neurological:     Mental Status: He is alert and oriented to person, place, and time. Mental status is at baseline.     Cranial Nerves: No cranial nerve deficit.     Coordination: Coordination normal.  Psychiatric:        Mood and Affect: Mood normal.        Behavior: Behavior normal.        Thought Content: Thought content normal.    RADIOGRAPHIC STUDIES: I have personally reviewed the radiological images as listed and agreed with the findings in the report.  CMP Latest Ref Rng & Units 09/26/2020  Glucose 70 - 99 mg/dL 95  BUN 8 - 23 mg/dL 9  Creatinine 0.61 - 1.24 mg/dL 1.25(H)  Sodium 135 - 145 mmol/L 136  Potassium 3.5 - 5.1 mmol/L 3.8  Chloride 98 - 111 mmol/L 102  CO2 22 - 32 mmol/L 28  Calcium 8.9 - 10.3 mg/dL 8.7(L)  Total Protein 6.5 - 8.1 g/dL 7.3  Total Bilirubin 0.3 - 1.2 mg/dL 0.7  Alkaline Phos 38 - 126 U/L 49  AST 15 - 41 U/L 27  ALT 0 - 44 U/L 20   CBC Latest Ref Rng & Units 09/26/2020  WBC 4.0 - 10.5 K/uL 6.1  Hemoglobin 13.0 - 17.0 g/dL 16.8  Hematocrit 39.0 - 52.0 % 49.0  Platelets 150 - 400 K/uL 143(L)    LABORATORY DATA:  I have reviewed the data as listed Lab Results  Component Value Date   WBC 6.1 09/26/2020   HGB 16.8 09/26/2020   HCT 49.0 09/26/2020   MCV 88.3 09/26/2020   PLT 143 (L) 09/26/2020   Recent Labs     03/28/20 1029 09/26/20 1252  NA 137 136  K 3.8 3.8  CL 104 102  CO2 26 28  GLUCOSE 109* 95  BUN 8 9  CREATININE 1.12 1.25*  CALCIUM 9.1 8.7*  GFRNONAA >60 >60  PROT 7.1 7.3  ALBUMIN 3.5 4.0  AST 23 27  ALT 24 20  ALKPHOS 60 49  BILITOT 0.7 0.7    Iron/TIBC/Ferritin/ %Sat No results found for: IRON, TIBC, FERRITIN, IRONPCTSAT      ASSESSMENT & PLAN:  1. History of DVT of lower extremity   2. Chronic anticoagulation   3. Erythrocytosis    #History of recurrent DVT. Continue Eliquis 2.5 mg twice daily for maintenance.  He tolerates well. Chronic lower extremity edema, probably vein insufficiency.  His body habitus probably contribute to that as well. Recommend leg elevation, compression stocking.  #Erythrocytosis, status post phlebotomy at the last visit.  Labs reviewed and discussed with patient  No need for phlebotomy today. Encourage patient to get and start use CPAP machine once available to him.Marland Kitchen  He can follow-up in 6 months    Orders Placed This Encounter  Procedures   CBC with Differential/Platelet    Standing Status:   Future    Standing Expiration Date:   09/26/2021   Comprehensive metabolic panel    Standing Status:   Future    Standing Expiration Date:   09/26/2021    All questions were answered. The patient knows to call the clinic with any problems questions or concerns.  Earlie Server, MD, PhD Hematology Oncology [facility] at [facility] Pager- [phone] 09/26/2020

## 2020-10-07 ENCOUNTER — Other Ambulatory Visit: Payer: Self-pay | Admitting: Physician Assistant

## 2020-10-07 DIAGNOSIS — G8929 Other chronic pain: Secondary | ICD-10-CM

## 2020-10-07 DIAGNOSIS — M545 Low back pain, unspecified: Secondary | ICD-10-CM

## 2020-10-19 ENCOUNTER — Ambulatory Visit
Admission: RE | Admit: 2020-10-19 | Discharge: 2020-10-19 | Disposition: A | Discharge status: Home / Self Care | Payer: Medicare Other | Attending: Physician Assistant | Admitting: Physician Assistant

## 2020-10-19 ENCOUNTER — Ambulatory Visit
Admission: RE | Admit: 2020-10-19 | Discharge: 2020-10-19 | Disposition: A | Discharge status: Home / Self Care | Payer: Medicare Other | Source: Ambulatory Visit | Attending: Physician Assistant | Admitting: Physician Assistant

## 2020-10-19 DIAGNOSIS — G8929 Other chronic pain: Secondary | ICD-10-CM | POA: Diagnosis present

## 2020-10-19 DIAGNOSIS — M545 Low back pain, unspecified: Secondary | ICD-10-CM | POA: Insufficient documentation

## 2020-10-20 ENCOUNTER — Other Ambulatory Visit: Payer: Self-pay | Admitting: Physician Assistant

## 2020-10-20 DIAGNOSIS — G8929 Other chronic pain: Secondary | ICD-10-CM

## 2020-10-20 DIAGNOSIS — M545 Low back pain, unspecified: Secondary | ICD-10-CM

## 2020-12-08 ENCOUNTER — Ambulatory Visit: Payer: Medicare Other | Admitting: Podiatry

## 2020-12-14 ENCOUNTER — Ambulatory Visit
Admission: RE | Admit: 2020-12-14 | Discharge: 2020-12-14 | Disposition: A | Discharge status: Home / Self Care | Payer: Medicare Other | Attending: Family Medicine | Admitting: Family Medicine

## 2020-12-14 ENCOUNTER — Other Ambulatory Visit: Payer: Self-pay | Admitting: Family Medicine

## 2020-12-14 ENCOUNTER — Ambulatory Visit
Admission: RE | Admit: 2020-12-14 | Discharge: 2020-12-14 | Disposition: A | Discharge status: Home / Self Care | Payer: Medicare Other | Source: Ambulatory Visit | Attending: Family Medicine | Admitting: Family Medicine

## 2020-12-14 DIAGNOSIS — M25562 Pain in left knee: Secondary | ICD-10-CM | POA: Diagnosis present

## 2021-01-18 ENCOUNTER — Other Ambulatory Visit: Payer: Self-pay | Admitting: General Surgery

## 2021-01-18 ENCOUNTER — Other Ambulatory Visit: Payer: Self-pay

## 2021-01-18 ENCOUNTER — Ambulatory Visit
Admission: RE | Admit: 2021-01-18 | Discharge: 2021-01-18 | Disposition: A | Discharge status: Home / Self Care | Payer: Medicare Other | Source: Ambulatory Visit | Attending: General Surgery | Admitting: General Surgery

## 2021-01-18 DIAGNOSIS — K420 Umbilical hernia with obstruction, without gangrene: Secondary | ICD-10-CM | POA: Insufficient documentation

## 2021-01-20 ENCOUNTER — Ambulatory Visit: Payer: Self-pay | Admitting: General Surgery

## 2021-01-20 NOTE — H&P (Signed)
PATIENT PROFILE: Zachary Sweeney is a 75 y.o. male who presents to the Clinic for consultation at the request of Dr. Brynda Greathouse for evaluation of recurrent umbilical hernia.   PCP:  Marden Noble, MD   HISTORY OF PRESENT ILLNESS: Zachary Sweeney reports he has been having pain in the periumbilical area.  Pain radiates to the left periumbilical area.  Pain aggravated by bending and certain abdominal wall movement.  Alleviating factor is resting.  Pain is aggravating.  He denies abdominal distention nausea or vomiting.   Patient had umbilical hernia repair around a year ago.  He endorses that he did well after the surgery but in the last few months the pain restarted.  He is concerned about recurrence.     PROBLEM LIST:         Problem List  Date Reviewed: 03/24/2020          Noted    BPH (benign prostatic hypertrophy) Unknown    Hypertension Unknown    Obesity 06/26/2013      GENERAL REVIEW OF SYSTEMS:    General ROS: negative for - chills, fatigue, fever, weight gain or weight loss Allergy and Immunology ROS: negative for - hives  Hematological and Lymphatic ROS: negative for - bleeding problems or bruising, negative for palpable nodes Endocrine ROS: negative for - heat or cold intolerance, hair changes Respiratory ROS: negative for - cough, shortness of breath or wheezing Cardiovascular ROS: no chest pain or palpitations GI ROS: negative for nausea, vomiting, diarrhea, constipation.  Positive for abdominal pain Musculoskeletal ROS: negative for - joint swelling or muscle pain Neurological ROS: negative for - confusion, syncope Dermatological ROS: negative for pruritus and rash Psychiatric: negative for anxiety, depression, difficulty sleeping and memory loss   MEDICATIONS: Current Medications        Current Outpatient Medications  Medication Sig Dispense Refill   albuterol 90 mcg/actuation inhaler Inhale 2 inhalations into the lungs every 6 (six) hours as needed       ANORO ELLIPTA  62.5-25 mcg/actuation inhaler INHALE 1 PUFF INTO THE LUNGS EVERY DAY 1 each 5   apixaban (ELIQUIS) 2.5 mg tablet Take 2.5 mg by mouth 2 (two) times daily       esomeprazole (NEXIUM) 20 MG DR capsule Take 20 mg by mouth 2 (two) times daily.        No current facility-administered medications for this visit.        ALLERGIES: Patient has no known allergies.   PAST MEDICAL HISTORY:     Past Medical History:  Diagnosis Date   BPH (benign prostatic hypertrophy)     Hypertension     Obesity 06/26/2013      PAST SURGICAL HISTORY:      Past Surgical History:  Procedure Laterality Date   ARTHROSCOPIC ROTATOR CUFF REPAIR       COLONOSCOPY   2002    Polyps [facility]. Alveta [facility] at [facility]   COLONOSCOPY   07/17/11    Dr. Angus Seller @ [facility] - Adenomatous Polyps   KNEE ARTHROSCOPY          FAMILY HISTORY:      Family History  Problem Relation Age of Onset   Lung cancer Mother     Cardiomyopathy (Abnormal function of the heart muscle) Father     Glaucoma Sister        SOCIAL HISTORY: Social History           Socioeconomic History   Marital status: Life Partner  Tobacco  Use   Smoking status: Former      Packs/day: 1.00      Types: Cigarettes      Quit date: 06/26/1993      Years since quitting: 27.5   Smokeless tobacco: Never  Substance and Sexual Activity   Alcohol use: No        PHYSICAL EXAM:    Vitals:    01/17/21 1607  BP: (!) 166/88  Pulse: 81    Body mass index is 38.65 kg/m. Weight: (!) 136.5 kg (301 lb)    GENERAL: Alert, active, oriented x3   HEENT: Pupils equal reactive to light. Extraocular movements are intact. Sclera clear. Palpebral conjunctiva normal red color.Pharynx clear.   NECK: Supple with no palpable mass and no adenopathy.   LUNGS: Sound clear with no rales rhonchi or wheezes.   HEART: Regular rhythm S1 and S2 without murmur.   ABDOMEN: Soft and depressible, nontender with no palpable mass, no hepatomegaly.  Indurated umbilical area due to scar  tissue.  Due to obesity and scar tissue, unable to appreciate any recurrent umbilical hernia   EXTREMITIES: Well-developed well-nourished symmetrical with no dependent edema.   NEUROLOGICAL: Awake alert oriented, facial expression symmetrical, moving all extremities.   REVIEW OF DATA: I have reviewed the following data today:      No visits with results within 3 Month(s) from this visit.  Latest known visit with results is:  Ancillary Procedure on 02/26/2020  Component Date Value   LV Ejection Fraction (%) 02/26/2020 55    Aortic Valve Stenosis Gr* 02/26/2020 none    Aortic Valve Regurgitati* 02/26/2020 trivial    Mitral Valve Stenosis Gr* 02/26/2020 none    Mitral Valve Regurgitati* 02/26/2020 trivial    Tricuspid Valve Regurgit* 02/26/2020 trivial    Tricuspid Valve Regurgit* 02/26/2020 2.8    Right Ventricle Systolic* 02/72/5366 44.0    LV End Diastolic Diamete* 34/74/2595 5    LV End Systolic Diameter* 63/87/5643 3.2    LV Septum Wall Thickness* 02/26/2020 0.97    LV Posterior Wall Thickn* 02/26/2020 0.95    Left Atrium Diameter (cm) 02/26/2020 4.2       ASSESSMENT: Zachary Sweeney is a 75 y.o. male presenting for consultation for recurrent umbilical hernia.   Patient with concern of recurrent umbilical hernia due to pain on the periumbilical area.  Physical exam very difficult to appreciate an obvious umbilical hernia due to the scar and patient's obesity.  Patient will need CT scan of the abdominal pelvis for further evaluation of possible recurrence of umbilical hernia.  I oriented the patient that if there is a recurrence of the hernia I would recommend minimally invasive approach for umbilical hernia repair.  I discussed with the patient about robotic assisted laparoscopic umbilical hernia repair.  I discussed with the patient the benefit of surgery and the risk that includes injury to intestine, infection, bleeding, fistulas, recurrence, chronic pain, among others.  The patient  report he understood and agreed to proceed.   CT scan of the abdomen shows recurrent umbilical hernia with incarcerated fat.  Patient oriented about results.  He was oriented about surgical management, recommendation and risk of surgery.  He reports he understood and agreed to proceed.   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  Glinda Natzke Cintron-Diaz, MD  

## 2021-01-20 NOTE — H&P (View-Only) (Signed)
PATIENT PROFILE: Zachary Sweeney is a 75 y.o. male who presents to the Clinic for consultation at the request of Dr. Brynda Greathouse for evaluation of recurrent umbilical hernia.   PCP:  Marden Noble, MD   HISTORY OF PRESENT ILLNESS: Zachary Sweeney reports he has been having pain in the periumbilical area.  Pain radiates to the left periumbilical area.  Pain aggravated by bending and certain abdominal wall movement.  Alleviating factor is resting.  Pain is aggravating.  He denies abdominal distention nausea or vomiting.   Patient had umbilical hernia repair around a year ago.  He endorses that he did well after the surgery but in the last few months the pain restarted.  He is concerned about recurrence.     PROBLEM LIST:         Problem List  Date Reviewed: 03/24/2020          Noted    BPH (benign prostatic hypertrophy) Unknown    Hypertension Unknown    Obesity 06/26/2013      GENERAL REVIEW OF SYSTEMS:    General ROS: negative for - chills, fatigue, fever, weight gain or weight loss Allergy and Immunology ROS: negative for - hives  Hematological and Lymphatic ROS: negative for - bleeding problems or bruising, negative for palpable nodes Endocrine ROS: negative for - heat or cold intolerance, hair changes Respiratory ROS: negative for - cough, shortness of breath or wheezing Cardiovascular ROS: no chest pain or palpitations GI ROS: negative for nausea, vomiting, diarrhea, constipation.  Positive for abdominal pain Musculoskeletal ROS: negative for - joint swelling or muscle pain Neurological ROS: negative for - confusion, syncope Dermatological ROS: negative for pruritus and rash Psychiatric: negative for anxiety, depression, difficulty sleeping and memory loss   MEDICATIONS: Current Medications        Current Outpatient Medications  Medication Sig Dispense Refill   albuterol 90 mcg/actuation inhaler Inhale 2 inhalations into the lungs every 6 (six) hours as needed       ANORO ELLIPTA  62.5-25 mcg/actuation inhaler INHALE 1 PUFF INTO THE LUNGS EVERY DAY 1 each 5   apixaban (ELIQUIS) 2.5 mg tablet Take 2.5 mg by mouth 2 (two) times daily       esomeprazole (NEXIUM) 20 MG DR capsule Take 20 mg by mouth 2 (two) times daily.        No current facility-administered medications for this visit.        ALLERGIES: Patient has no known allergies.   PAST MEDICAL HISTORY:     Past Medical History:  Diagnosis Date   BPH (benign prostatic hypertrophy)     Hypertension     Obesity 06/26/2013      PAST SURGICAL HISTORY:      Past Surgical History:  Procedure Laterality Date   ARTHROSCOPIC ROTATOR CUFF REPAIR       COLONOSCOPY   2002    Polyps - Dr. Alveta He[facility]lich at [facility]   COLONOSCOPY   07/17/11    Dr. Angus Seller @ [facility] - Adenomatous Polyps   KNEE ARTHROSCOPY          FAMILY HISTORY:      Family History  Problem Relation Age of Onset   Lung cancer Mother     Cardiomyopathy (Abnormal function of the heart muscle) Father     Glaucoma Sister        SOCIAL HISTORY: Social History           Socioeconomic History   Marital status: Life Partner  Tobacco  Use   Smoking status: Former      Packs/day: 1.00      Types: Cigarettes      Quit date: 06/26/1993      Years since quitting: 27.5   Smokeless tobacco: Never  Substance and Sexual Activity   Alcohol use: No        PHYSICAL EXAM:    Vitals:    01/17/21 1607  BP: (!) 166/88  Pulse: 81    Body mass index is 38.65 kg/m. Weight: (!) 136.5 kg (301 lb)    GENERAL: Alert, active, oriented x3   HEENT: Pupils equal reactive to light. Extraocular movements are intact. Sclera clear. Palpebral conjunctiva normal red color.Pharynx clear.   NECK: Supple with no palpable mass and no adenopathy.   LUNGS: Sound clear with no rales rhonchi or wheezes.   HEART: Regular rhythm S1 and S2 without murmur.   ABDOMEN: Soft and depressible, nontender with no palpable mass, no hepatomegaly.  Indurated umbilical area due to scar  tissue.  Due to obesity and scar tissue, unable to appreciate any recurrent umbilical hernia   EXTREMITIES: Well-developed well-nourished symmetrical with no dependent edema.   NEUROLOGICAL: Awake alert oriented, facial expression symmetrical, moving all extremities.   REVIEW OF DATA: I have reviewed the following data today:      No visits with results within 3 Month(s) from this visit.  Latest known visit with results is:  Ancillary Procedure on 02/26/2020  Component Date Value   LV Ejection Fraction (%) 02/26/2020 55    Aortic Valve Stenosis Gr* 02/26/2020 none    Aortic Valve Regurgitati* 02/26/2020 trivial    Mitral Valve Stenosis Gr* 02/26/2020 none    Mitral Valve Regurgitati* 02/26/2020 trivial    Tricuspid Valve Regurgit* 02/26/2020 trivial    Tricuspid Valve Regurgit* 02/26/2020 2.8    Right Ventricle Systolic* 14/43/1540 08.6    LV End Diastolic Diamete* 76/19/5093 5    LV End Systolic Diameter* 26/71/2458 3.2    LV Septum Wall Thickness* 02/26/2020 0.97    LV Posterior Wall Thickn* 02/26/2020 0.95    Left Atrium Diameter (cm) 02/26/2020 4.2       ASSESSMENT: Zachary Sweeney is a 75 y.o. male presenting for consultation for recurrent umbilical hernia.   Patient with concern of recurrent umbilical hernia due to pain on the periumbilical area.  Physical exam very difficult to appreciate an obvious umbilical hernia due to the scar and patient's obesity.  Patient will need CT scan of the abdominal pelvis for further evaluation of possible recurrence of umbilical hernia.  I oriented the patient that if there is a recurrence of the hernia I would recommend minimally invasive approach for umbilical hernia repair.  I discussed with the patient about robotic assisted laparoscopic umbilical hernia repair.  I discussed with the patient the benefit of surgery and the risk that includes injury to intestine, infection, bleeding, fistulas, recurrence, chronic pain, among others.  The patient  report he understood and agreed to proceed.   CT scan of the abdomen shows recurrent umbilical hernia with incarcerated fat.  Patient oriented about results.  He was oriented about surgical management, recommendation and risk of surgery.  He reports he understood and agreed to proceed.   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  Herbert Pun, MD

## 2021-01-26 ENCOUNTER — Other Ambulatory Visit: Payer: Self-pay

## 2021-01-26 ENCOUNTER — Other Ambulatory Visit
Admission: RE | Admit: 2021-01-26 | Discharge: 2021-01-26 | Disposition: A | Discharge status: Home / Self Care | Payer: Medicare Other | Source: Ambulatory Visit | Attending: General Surgery | Admitting: General Surgery

## 2021-01-26 DIAGNOSIS — I10 Essential (primary) hypertension: Secondary | ICD-10-CM

## 2021-01-26 NOTE — Patient Instructions (Addendum)
Your procedure is scheduled on: 02/01/21 - Wednesday Report to the Registration Desk on the 1st floor of the [facility]. To find out your arrival time, please call [phone] between 1PM - 3PM on: 01/31/21 - Tuesday Report To [facility] for Labs/EKG on 01/30/21 at 11:30 am.  REMEMBER: Instructions that are not followed completely may result in serious medical risk, up to and including death; or upon the discretion of your surgeon and anesthesiologist your surgery may need to be rescheduled.  Do not eat food after midnight the night before surgery.  No gum chewing, lozengers or hard candies.  You may however, drink CLEAR liquids up to 2 hours before you are scheduled to arrive for your surgery. Do not drink anything within 2 hours of your scheduled arrival time.  Clear liquids include: - water  - apple juice without pulp - gatorade (not RED, PURPLE, OR BLUE) - black coffee or tea (Do NOT add milk or creamers to the coffee or tea) Do NOT drink anything that is not on this list.  TAKE THESE MEDICATIONS THE MORNING OF SURGERY WITH A SIP OF WATER:  - omeprazole (PRILOSEC) 20 MG capsule, (take one the night before and one on the morning of surgery - helps to prevent nausea after surgery.) - umeclidinium-vilanterol (ANORO ELLIPTA) 62.5-25 MCG/INH AEPB - diltiazem (CARDIZEM CD) 300 MG 24 hr capsule  Use albuterol (VENTOLIN HFA) 108 (90 Base) MCG/ACT inhaler on the day of surgery and bring to the hospital.  Follow recommendations from Cardiologist, Pulmonologist or PCP regarding stopping Aspirin, Coumadin, Plavix, Eliquis, Pradaxa, or Pletal. Do Not take Eliquis on 12/12, 12/13, and do not take the day of surgery.  One week prior to surgery: Stop Anti-inflammatories (NSAIDS) such as Advil, Aleve, Ibuprofen, Motrin, Naproxen, Naprosyn and Aspirin based products such as Excedrin, Goodys Powder, BC Powder.  Stop ANY OVER THE COUNTER supplements until after surgery.  You may  however, continue to take Tylenol if needed for pain up until the day of surgery.  No Alcohol for 24 hours before or after surgery.  No Smoking including e-cigarettes for 24 hours prior to surgery.  No chewable tobacco products for at least 6 hours prior to surgery.  No nicotine patches on the day of surgery.  Do not use any "recreational" drugs for at least a week prior to your surgery.  Please be advised that the combination of cocaine and anesthesia may have negative outcomes, up to and including death. If you test positive for cocaine, your surgery will be cancelled.  On the morning of surgery brush your teeth with toothpaste and water, you may rinse your mouth with mouthwash if you wish. Do not swallow any toothpaste or mouthwash.  Use CHG Soap or wipes as directed on instruction sheet.  Do not wear jewelry, make-up, hairpins, clips or nail polish.  Do not wear lotions, powders, or perfumes.   Do not shave body from the neck down 48 hours prior to surgery just in case you cut yourself which could leave a site for infection.  Also, freshly shaved skin may become irritated if using the CHG soap.  Contact lenses, hearing aids and dentures may not be worn into surgery.  Do not bring valuables to the hospital. [facility] is not responsible for any missing/lost belongings or valuables.   Notify your doctor if there is any change in your medical condition (cold, fever, infection).  Wear comfortable clothing (specific to your surgery type) to the hospital.  After  surgery, you can help prevent lung complications by doing breathing exercises.  Take deep breaths and cough every 1-2 hours. Your doctor may order a device called an Incentive Spirometer to help you take deep breaths. When coughing or sneezing, hold a pillow firmly against your incision with both hands. This is called "splinting." Doing this helps protect your incision. It also decreases belly discomfort.  If you are being  admitted to the hospital overnight, leave your suitcase in the car. After surgery it may be brought to your room.  If you are being discharged the day of surgery, you will not be allowed to drive home. You will need a responsible adult (18 years or older) to drive you home and stay with you that night.   If you are taking public transportation, you will need to have a responsible adult (18 years or older) with you. Please confirm with your physician that it is acceptable to use public transportation.   Please call the [facility] at [phone] if you have any questions about these instructions.  Surgery Visitation Policy:  Patients undergoing a surgery or procedure may have one family member or support person with them as long as that person is not COVID-19 positive or experiencing its symptoms.  That person may remain in the waiting area during the procedure and may rotate out with other people.  Inpatient Visitation:    Visiting hours are 7 a.m. to 8 p.m. Up to two visitors ages 16+ are allowed at one time in a patient room. The visitors may rotate out with other people during the day. Visitors must check out when they leave, or other visitors will not be allowed. One designated support person may remain overnight. The visitor must pass COVID-19 screenings, use hand sanitizer when entering and exiting the patient's room and wear a mask at all times, including in the patient's room. Patients must also wear a mask when staff or their visitor are in the room. Masking is required regardless of vaccination status.

## 2021-01-30 ENCOUNTER — Encounter: Payer: Self-pay | Admitting: Urgent Care

## 2021-01-30 ENCOUNTER — Other Ambulatory Visit
Admission: RE | Admit: 2021-01-30 | Discharge: 2021-01-30 | Disposition: A | Discharge status: Home / Self Care | Payer: Medicare Other | Source: Ambulatory Visit | Attending: General Surgery | Admitting: General Surgery

## 2021-01-30 ENCOUNTER — Other Ambulatory Visit: Payer: Self-pay

## 2021-01-30 DIAGNOSIS — I10 Essential (primary) hypertension: Secondary | ICD-10-CM

## 2021-01-30 DIAGNOSIS — Z01818 Encounter for other preprocedural examination: Secondary | ICD-10-CM | POA: Insufficient documentation

## 2021-01-30 LAB — CBC
HCT: 50.4 % (ref 39.0–52.0)
Hemoglobin: 17.2 g/dL — ABNORMAL HIGH (ref 13.0–17.0)
MCH: 29.9 pg (ref 26.0–34.0)
MCHC: 34.1 g/dL (ref 30.0–36.0)
MCV: 87.7 fL (ref 80.0–100.0)
Platelets: 166 10*3/uL (ref 150–400)
RBC: 5.75 MIL/uL (ref 4.22–5.81)
RDW: 13.5 % (ref 11.5–15.5)
WBC: 7.7 10*3/uL (ref 4.0–10.5)
nRBC: 0 % (ref 0.0–0.2)

## 2021-01-30 LAB — BASIC METABOLIC PANEL
Anion gap: 6 (ref 5–15)
BUN: 11 mg/dL (ref 8–23)
CO2: 26 mmol/L (ref 22–32)
Calcium: 9.3 mg/dL (ref 8.9–10.3)
Chloride: 102 mmol/L (ref 98–111)
Creatinine, Ser: 1.19 mg/dL (ref 0.61–1.24)
GFR, Estimated: >60 mL/min (ref >60)
Glucose, Bld: 94 mg/dL (ref 70–99)
Potassium: 4 mmol/L (ref 3.5–5.1)
Sodium: 134 mmol/L — ABNORMAL LOW (ref 135–145)

## 2021-02-01 ENCOUNTER — Other Ambulatory Visit: Payer: Self-pay

## 2021-02-01 ENCOUNTER — Encounter
Admission: RE | Disposition: A | Discharge status: Home / Self Care | Payer: Self-pay | Source: Home / Self Care | Attending: General Surgery

## 2021-02-01 ENCOUNTER — Ambulatory Visit
Admission: RE | Admit: 2021-02-01 | Discharge: 2021-02-01 | Disposition: A | Discharge status: Home / Self Care | Payer: Medicare Other | Attending: General Surgery | Admitting: General Surgery

## 2021-02-01 ENCOUNTER — Ambulatory Visit: Payer: Medicare Other | Admitting: Certified Registered Nurse Anesthetist

## 2021-02-01 ENCOUNTER — Encounter: Payer: Self-pay | Admitting: General Surgery

## 2021-02-01 DIAGNOSIS — G4730 Sleep apnea, unspecified: Secondary | ICD-10-CM | POA: Insufficient documentation

## 2021-02-01 DIAGNOSIS — K4031 Unilateral inguinal hernia, with obstruction, without gangrene, recurrent: Secondary | ICD-10-CM | POA: Diagnosis not present

## 2021-02-01 DIAGNOSIS — K449 Diaphragmatic hernia without obstruction or gangrene: Secondary | ICD-10-CM | POA: Diagnosis not present

## 2021-02-01 DIAGNOSIS — J449 Chronic obstructive pulmonary disease, unspecified: Secondary | ICD-10-CM | POA: Insufficient documentation

## 2021-02-01 DIAGNOSIS — K219 Gastro-esophageal reflux disease without esophagitis: Secondary | ICD-10-CM | POA: Insufficient documentation

## 2021-02-01 DIAGNOSIS — I10 Essential (primary) hypertension: Secondary | ICD-10-CM | POA: Diagnosis not present

## 2021-02-01 DIAGNOSIS — R0600 Dyspnea, unspecified: Secondary | ICD-10-CM | POA: Diagnosis not present

## 2021-02-01 DIAGNOSIS — Z7901 Long term (current) use of anticoagulants: Secondary | ICD-10-CM | POA: Diagnosis not present

## 2021-02-01 DIAGNOSIS — Z87891 Personal history of nicotine dependence: Secondary | ICD-10-CM | POA: Diagnosis not present

## 2021-02-01 HISTORY — PX: INSERTION OF MESH: SHX5868

## 2021-02-01 SURGERY — REPAIR, HERNIA, UMBILICAL, ROBOT-ASSISTED
Anesthesia: General | Site: Abdomen

## 2021-02-01 MED ORDER — CHLORHEXIDINE GLUCONATE 0.12 % MT SOLN: 15.0000 mL | Freq: Once | OROMUCOSAL | Status: AC

## 2021-02-01 MED ORDER — FENTANYL CITRATE (PF) 100 MCG/2ML IJ SOLN
INTRAMUSCULAR | Status: DC | PRN
  Administered 2021-02-01 (×4): 50 ug via INTRAVENOUS

## 2021-02-01 MED ORDER — ACETAMINOPHEN 10 MG/ML IV SOLN
INTRAVENOUS | Status: AC
  Filled 2021-02-01: qty 100

## 2021-02-01 MED ORDER — CHLORHEXIDINE GLUCONATE 0.12 % MT SOLN
OROMUCOSAL | Status: AC
  Administered 2021-02-01: 07:00:00 15 mL via OROMUCOSAL
  Filled 2021-02-01: qty 15

## 2021-02-01 MED ORDER — LIDOCAINE HCL (CARDIAC) PF 100 MG/5ML IV SOSY
PREFILLED_SYRINGE | INTRAVENOUS | Status: DC | PRN
  Administered 2021-02-01: 100 mg via INTRAVENOUS

## 2021-02-01 MED ORDER — CEFAZOLIN SODIUM-DEXTROSE 2-4 GM/100ML-% IV SOLN
2.0000 g | INTRAVENOUS | Status: AC
  Administered 2021-02-01: 08:00:00 3 g via INTRAVENOUS

## 2021-02-01 MED ORDER — SUGAMMADEX SODIUM 500 MG/5ML IV SOLN
INTRAVENOUS | Status: AC
  Filled 2021-02-01: qty 5

## 2021-02-01 MED ORDER — MORPHINE SULFATE (PF) 2 MG/ML IV SOLN
INTRAVENOUS | Status: AC
  Administered 2021-02-01: 11:00:00 2 mg via INTRAVENOUS
  Filled 2021-02-01: qty 3

## 2021-02-01 MED ORDER — OXYCODONE HCL 5 MG PO TABS
ORAL_TABLET | ORAL | Status: AC
  Administered 2021-02-01: 11:00:00 5 mg via ORAL
  Filled 2021-02-01: qty 1

## 2021-02-01 MED ORDER — BUPIVACAINE-EPINEPHRINE (PF) 0.25% -1:200000 IJ SOLN
INTRAMUSCULAR | Status: AC
  Filled 2021-02-01: qty 30

## 2021-02-01 MED ORDER — ONDANSETRON HCL 4 MG/2ML IJ SOLN
INTRAMUSCULAR | Status: DC | PRN
  Administered 2021-02-01: 4 mg via INTRAVENOUS

## 2021-02-01 MED ORDER — SUGAMMADEX SODIUM 200 MG/2ML IV SOLN
INTRAVENOUS | Status: DC | PRN
  Administered 2021-02-01: 500 mg via INTRAVENOUS

## 2021-02-01 MED ORDER — OXYCODONE HCL 5 MG PO TABS: 5.0000 mg | ORAL_TABLET | Freq: Once | ORAL | Status: AC | PRN

## 2021-02-01 MED ORDER — LACTATED RINGERS IV SOLN: INTRAVENOUS | Status: DC

## 2021-02-01 MED ORDER — MORPHINE SULFATE (PF) 4 MG/ML IV SOLN
INTRAVENOUS | Status: AC
  Filled 2021-02-01: qty 1

## 2021-02-01 MED ORDER — FENTANYL CITRATE (PF) 100 MCG/2ML IJ SOLN
25.0000 ug | INTRAMUSCULAR | Status: DC | PRN
  Administered 2021-02-01 (×3): 25 ug via INTRAVENOUS

## 2021-02-01 MED ORDER — FENTANYL CITRATE (PF) 100 MCG/2ML IJ SOLN
INTRAMUSCULAR | Status: AC
  Filled 2021-02-01: qty 2

## 2021-02-01 MED ORDER — HYDROCODONE-ACETAMINOPHEN 5-325 MG PO TABS: 1.0000 | ORAL_TABLET | ORAL | 0 refills | Status: AC | PRN

## 2021-02-01 MED ORDER — FENTANYL CITRATE (PF) 100 MCG/2ML IJ SOLN
INTRAMUSCULAR | Status: AC
  Administered 2021-02-01: 10:00:00 25 ug via INTRAVENOUS
  Filled 2021-02-01: qty 2

## 2021-02-01 MED ORDER — ORAL CARE MOUTH RINSE: 15.0000 mL | Freq: Once | OROMUCOSAL | Status: AC

## 2021-02-01 MED ORDER — MORPHINE SULFATE (PF) 2 MG/ML IV SOLN
INTRAVENOUS | Status: AC
  Filled 2021-02-01: qty 1

## 2021-02-01 MED ORDER — ROCURONIUM BROMIDE 100 MG/10ML IV SOLN
INTRAVENOUS | Status: DC | PRN
  Administered 2021-02-01 (×2): 20 mg via INTRAVENOUS
  Administered 2021-02-01 (×2): 30 mg via INTRAVENOUS

## 2021-02-01 MED ORDER — CEFAZOLIN SODIUM-DEXTROSE 2-4 GM/100ML-% IV SOLN
INTRAVENOUS | Status: AC
  Filled 2021-02-01: qty 100

## 2021-02-01 MED ORDER — BUPIVACAINE-EPINEPHRINE (PF) 0.25% -1:200000 IJ SOLN
INTRAMUSCULAR | Status: DC | PRN
  Administered 2021-02-01: 30 mL

## 2021-02-01 MED ORDER — SUCCINYLCHOLINE CHLORIDE 200 MG/10ML IV SOSY
PREFILLED_SYRINGE | INTRAVENOUS | Status: DC | PRN
  Administered 2021-02-01: 140 mg via INTRAVENOUS

## 2021-02-01 MED ORDER — PROPOFOL 10 MG/ML IV BOLUS
INTRAVENOUS | Status: DC | PRN
  Administered 2021-02-01: 150 mg via INTRAVENOUS

## 2021-02-01 MED ORDER — DEXAMETHASONE SODIUM PHOSPHATE 10 MG/ML IJ SOLN
INTRAMUSCULAR | Status: DC | PRN
  Administered 2021-02-01: 10 mg via INTRAVENOUS

## 2021-02-01 MED ORDER — DEXMEDETOMIDINE (PRECEDEX) IN NS 20 MCG/5ML (4 MCG/ML) IV SYRINGE
PREFILLED_SYRINGE | INTRAVENOUS | Status: DC | PRN
  Administered 2021-02-01 (×2): 4 ug via INTRAVENOUS
  Administered 2021-02-01: 8 ug via INTRAVENOUS
  Administered 2021-02-01: 4 ug via INTRAVENOUS

## 2021-02-01 MED ORDER — MORPHINE SULFATE (PF) 2 MG/ML IV SOLN
2.0000 mg | INTRAVENOUS | Status: DC | PRN
  Administered 2021-02-01 (×3): 2 mg via INTRAVENOUS

## 2021-02-01 MED ORDER — PROPOFOL 10 MG/ML IV BOLUS
INTRAVENOUS | Status: AC
  Filled 2021-02-01: qty 20

## 2021-02-01 MED ORDER — ACETAMINOPHEN 10 MG/ML IV SOLN
INTRAVENOUS | Status: DC | PRN
  Administered 2021-02-01: 1000 mg via INTRAVENOUS

## 2021-02-01 MED ORDER — OXYCODONE HCL 5 MG/5ML PO SOLN: 5.0000 mg | Freq: Once | ORAL | Status: AC | PRN

## 2021-02-01 SURGICAL SUPPLY — 47 items
ADH SKN CLS APL DERMABOND .7 (GAUZE/BANDAGES/DRESSINGS) ×2
BAG INFUSER PRESSURE 100CC (MISCELLANEOUS) IMPLANT
BLADE SURG SZ11 CARB STEEL (BLADE) ×3 IMPLANT
COVER TIP SHEARS 8 DVNC (MISCELLANEOUS) ×2 IMPLANT
COVER TIP SHEARS 8MM DA VINCI (MISCELLANEOUS) ×1
DERMABOND ADVANCED (GAUZE/BANDAGES/DRESSINGS) ×1
DERMABOND ADVANCED .7 DNX12 (GAUZE/BANDAGES/DRESSINGS) ×2 IMPLANT
DRAPE ARM DVNC X/XI (DISPOSABLE) ×6 IMPLANT
DRAPE COLUMN DVNC XI (DISPOSABLE) ×2 IMPLANT
DRAPE DA VINCI XI ARM (DISPOSABLE) ×3
DRAPE DA VINCI XI COLUMN (DISPOSABLE) ×1
ELECT REM PT RETURN 9FT ADLT (ELECTROSURGICAL) ×3
ELECTRODE REM PT RTRN 9FT ADLT (ELECTROSURGICAL) ×2 IMPLANT
GAUZE 4X4 16PLY ~~LOC~~+RFID DBL (SPONGE) ×3 IMPLANT
GLOVE SURG ENC MOIS LTX SZ6.5 (GLOVE) ×6 IMPLANT
GLOVE SURG UNDER POLY LF SZ6.5 (GLOVE) ×6 IMPLANT
GOWN STRL REUS W/ TWL LRG LVL3 (GOWN DISPOSABLE) ×6 IMPLANT
GOWN STRL REUS W/TWL LRG LVL3 (GOWN DISPOSABLE) ×9
IRRIGATOR SUCT 8 DISP DVNC XI (IRRIGATION / IRRIGATOR) IMPLANT
IRRIGATOR SUCTION 8MM XI DISP (IRRIGATION / IRRIGATOR)
IV NS 1000ML (IV SOLUTION)
IV NS 1000ML BAXH (IV SOLUTION) IMPLANT
KIT PINK PAD W/HEAD ARE REST (MISCELLANEOUS) ×3
KIT PINK PAD W/HEAD ARM REST (MISCELLANEOUS) ×2 IMPLANT
LABEL OR SOLS (LABEL) ×3 IMPLANT
MANIFOLD NEPTUNE II (INSTRUMENTS) ×3 IMPLANT
MESH VENTRALIGHT ST 6X8 (Mesh Specialty) ×3 IMPLANT
MESH VENTRLGHT ELLIPSE 8X6XMFL (Mesh Specialty) IMPLANT
NDL INSUFFLATION 14GA 120MM (NEEDLE) ×2 IMPLANT
NEEDLE HYPO 22GX1.5 SAFETY (NEEDLE) ×3 IMPLANT
NEEDLE INSUFFLATION 14GA 120MM (NEEDLE) ×3 IMPLANT
NS IRRIG 500ML POUR BTL (IV SOLUTION) ×3 IMPLANT
OBTURATOR OPTICAL STANDARD 8MM (TROCAR) ×1
OBTURATOR OPTICAL STND 8 DVNC (TROCAR) ×2
OBTURATOR OPTICALSTD 8 DVNC (TROCAR) ×2 IMPLANT
PACK LAP CHOLECYSTECTOMY (MISCELLANEOUS) ×3 IMPLANT
SEAL CANN UNIV 5-8 DVNC XI (MISCELLANEOUS) ×6 IMPLANT
SEAL XI 5MM-8MM UNIVERSAL (MISCELLANEOUS) ×3
SET TUBE SMOKE EVAC HIGH FLOW (TUBING) ×3 IMPLANT
SOLUTION ELECTROLUBE (MISCELLANEOUS) ×3 IMPLANT
SUT MNCRL 4-0 (SUTURE) ×3
SUT MNCRL 4-0 27XMFL (SUTURE) ×2
SUT STRATAFIX PDS 30 CT-1 (SUTURE) ×3 IMPLANT
SUT VICRYL 0 AB UR-6 (SUTURE) ×3 IMPLANT
SUT VLOC 90 2/L VL 12 GS22 (SUTURE) ×7 IMPLANT
SUTURE MNCRL 4-0 27XMF (SUTURE) ×2 IMPLANT
WATER STERILE IRR 500ML POUR (IV SOLUTION) ×2 IMPLANT

## 2021-02-01 NOTE — Op Note (Signed)
Preoperative diagnosis: Recurrent Umbilical Hernia  Postoperative diagnosis: Recurrent umbilical incarcerated hernia  Procedure: Robotic assisted laparoscopic recurrent umbilical incarcerated hernia repair with mesh  Anesthesia: General  Surgeon: Dr. Windell Moment  Wound Classification: Clean  Specimen: None  Complications: None  Estimated Blood Loss: 57ml  Indications: Patient is a 75 y.o. male developed a recurrent umbilical hernia. This was a recurrent, symptomatic incarcerated and repair was indicated.   Findings: 5 cm x 4 cm hernia 2. Repair achieved with closure of the anterior fascia at midline and 20 x 15 cm Ventraligh mesh 3. Adequate hemostasis  Description of procedure: The patient was brought to the operating room and general anesthesia was induced. A time-out was completed verifying correct patient, procedure, site, positioning, and implant(s) and/or special equipment prior to beginning this procedure. Antibiotics were administered prior to making the incision. SCDs placed. The anterior abdominal wall was prepped and draped in the standard sterile fashion.   Palmer's point chosen for entry.  Veress needle placed and abdomen insufflated to 15cm without any dramatic increase in pressure.  Needle removed and optiview technique used to place 56mm port at same point.  No injury noted during placement.  Two additional ports, 48mm x2 along left lateral aspect placed.  A 12 mm trocar was exchanged on the left upper area. Xi robot then docked into place.  Hernia contents noted and reduced with combination of blunt, sharp dissection with scissors and fenestrated forceps.  Hemostasis achieved throughout this portion.  Once all hernia contents reduced, there was noted to be a 5 cm x 4 cm hernia.    Insufflation dropped to 31mm and transfacial suture with 0 stratafix used to primarily close defect under minimal tension. Ventralight protected 20 x 15 cm mesh was placed within the abdominal  cavity through 26mm port and secured to the abdominal wall centered over the defect using the 0 stratafix previously used to primarily close defect.  The mesh was then circumferentially sutured into the anterior abdominal wall using 2-0 VLock x3.  Any bleeding noted during this portion was no longer actively bleeding by end of securing mesh and tightening the suture.    Robot was undocked.  The 19mm cannula was removed and port site was closed using PMI device and 0 vicryl suture, ensuring no bowels were injured during this process.  Abdomen then desufflated while camera within abdomen to ensure no signs of new bleed prior to removing camera and rest of ports completely.  All skin incisions closed with runninrg 4-0 Monocryl in a subcuticular fashion.  All wounds then dressed with Dermabond.  Patient was then successfully awakened and transferred to PACU in stable condition.  At the end of the procedure sponge and instrument counts were correct.

## 2021-02-01 NOTE — Anesthesia Preprocedure Evaluation (Signed)
Anesthesia Evaluation  Patient identified by MRN, date of birth, ID band Patient awake    Reviewed: Allergy & Precautions, H&P , NPO status , Patient's Chart, lab work & pertinent test results  History of Anesthesia Complications Negative for: history of anesthetic complications  Airway Mallampati: II  TM Distance: >3 FB Neck ROM: full    Dental  (+) Upper Dentures Multiple missing lower teeth:   Pulmonary sleep apnea and Continuous Positive Airway Pressure Ventilation , COPD,  COPD inhaler, Not current smoker, former smoker,    breath sounds clear to auscultation       Cardiovascular hypertension, (-) angina+ DOE  (-) Past MI and (-) Cardiac Stents (-) dysrhythmias  Rhythm:regular Rate:Normal     Neuro/Psych negative neurological ROS  negative psych ROS   GI/Hepatic Neg liver ROS, hiatal hernia, GERD  ,  Endo/Other  Obesity BMI 40  Renal/GU      Musculoskeletal   Abdominal   Peds  Hematology negative hematology ROS (+)   Anesthesia Other Findings Past Medical History: No date: Acid reflux No date: Back injury No date: Colon polyp No date: DVT (deep venous thrombosis) ([place])     Comment:  a. 06/2017 LE U/S: Right popliteal vein DVT. No date: Dyspnea on exertion     Comment:  a. 06/2017 CTA Chest: No PE. No significant coronary               Ca2+, centrilobular and paraseptal emphysema;  b.  07/2017              Echo: EF 60-65%, no rwma, Gr1 DD, mildly dil LA. Nl RV               fxn; c. 07/2017 MV: Small, severe defect involving apical               inf and apical segments - only on rest images consistent               w/ artifact. No ischemia or scar. No date: Emphysema of lung ([place])     Comment:  a. 06/2017 CTA Chest: Centrilobular and paraseptal               emphysema, with mild geographic ground-glass opacity               likely representing small airway dzs. 04/17/2019: Erythrocytosis No date:  Hypertension No date: Sleep apnea No date: Torn ligament  Past Surgical History: 2008: COLONOSCOPY 02/27/2017: COLONOSCOPY WITH PROPOFOL; N/A     Comment:  Procedure: COLONOSCOPY WITH PROPOFOL;  Surgeon: Robert Bellow, MD;  Location: [facility] ENDOSCOPY;  Service:               Endoscopy;  Laterality: N/A; 07/24/2018: COLONOSCOPY WITH PROPOFOL; N/A     Comment:  Procedure: COLONOSCOPY WITH PROPOFOL;  Surgeon:               Virgel Manifold, MD;  Location: [facility] ENDOSCOPY;                Service: Endoscopy;  Laterality: N/A; 02/27/2017: ESOPHAGOGASTRODUODENOSCOPY (EGD) WITH PROPOFOL; N/A     Comment:  Procedure: ESOPHAGOGASTRODUODENOSCOPY (EGD) WITH               PROPOFOL;  Surgeon: Robert Bellow, MD;  Location:               [facility] ENDOSCOPY;  Service: Endoscopy;  Laterality: N/A; 07/24/2018: ESOPHAGOGASTRODUODENOSCOPY (EGD) WITH PROPOFOL; N/A     Comment:  Procedure: ESOPHAGOGASTRODUODENOSCOPY (EGD) WITH               PROPOFOL;  Surgeon: Virgel Manifold, MD;  Location:               [facility] ENDOSCOPY;  Service: Endoscopy;  Laterality: N/A; No date: HERNIA REPAIR; Right     Comment:  inguinal No date: KNEE SURGERY; Left No date: NECK SURGERY No date: ROTATOR CUFF REPAIR; Right 06/20/7614: UMBILICAL HERNIA REPAIR; N/A     Comment:  Procedure: HERNIA REPAIR UMBILICAL ADULT;  Surgeon:               Ronny Bacon, MD;  Location: [facility] ORS;  Service:               General;  Laterality: N/A;  BMI    Body Mass Index: 39.58 kg/m      Reproductive/Obstetrics negative OB ROS                             Anesthesia Physical Anesthesia Plan  ASA: 3  Anesthesia Plan: General ETT   Post-op Pain Management:    Induction:   PONV Risk Score and Plan: Ondansetron, Dexamethasone and Treatment may vary due to age or medical condition  Airway Management Planned:   Additional Equipment:   Intra-op Plan:   Post-operative Plan:   Informed  Consent: I have reviewed the patients History and Physical, chart, labs and discussed the procedure including the risks, benefits and alternatives for the proposed anesthesia with the patient or authorized representative who has indicated his/her understanding and acceptance.     Dental Advisory Given  Plan Discussed with: Anesthesiologist, CRNA and Surgeon  Anesthesia Plan Comments:         Anesthesia Quick Evaluation

## 2021-02-01 NOTE — Anesthesia Procedure Notes (Signed)
Procedure Name: Intubation Date/Time: 02/01/2021 7:40 AM Performed by: Lily Peer, Sinai Mahany, CRNA Pre-anesthesia Checklist: Patient identified, Emergency Drugs available, Suction available and Patient being monitored Patient Re-evaluated:Patient Re-evaluated prior to induction Oxygen Delivery Method: Circle system utilized Preoxygenation: Pre-oxygenation with 100% oxygen Induction Type: IV induction Ventilation: Mask ventilation without difficulty and Oral airway inserted - appropriate to patient size Laryngoscope Size: McGraph and 4 Grade View: Grade I Tube type: Oral Tube size: 7.5 mm Number of attempts: 1 Airway Equipment and Method: Stylet and Oral airway Placement Confirmation: ETT inserted through vocal cords under direct vision, positive ETCO2 and breath sounds checked- equal and bilateral Secured at: 23 cm Tube secured with: Tape Dental Injury: Teeth and Oropharynx as per pre-operative assessment

## 2021-02-01 NOTE — Interval H&P Note (Signed)
History and Physical Interval Note:  02/01/2021 6:59 AM  Zachary Sweeney  has presented today for surgery, with the diagnosis of G01.7 Recurrent umbilical hernia w/ incarceration.  The various methods of treatment have been discussed with the patient and family. After consideration of risks, benefits and other options for treatment, the patient has consented to  Procedure(s): XI [place] (N/A) as a surgical intervention.  The patient's history has been reviewed, patient examined, no change in status, stable for surgery.  I have reviewed the patient's chart and labs.  Questions were answered to the patient's satisfaction.     Herbert Pun

## 2021-02-01 NOTE — Progress Notes (Signed)
Pt states pain is 25/10 after 16mcg of Fentanyl. Pt states pain has not gotten any better. Dr. Lubertha Basque notified. Acknowledged. Orders received. See [facility]

## 2021-02-01 NOTE — Anesthesia Postprocedure Evaluation (Signed)
Anesthesia Post Note  Patient: Zachary Sweeney  Procedure(s) Performed: XI ROBOT ASSISTED UMBILICAL HERNIA REPAIR (Abdomen) INSERTION OF MESH  Patient location during evaluation: PACU Anesthesia Type: General Level of consciousness: awake and alert Pain management: pain level controlled Vital Signs Assessment: post-procedure vital signs reviewed and stable Respiratory status: spontaneous breathing, nonlabored ventilation, respiratory function stable and patient connected to nasal cannula oxygen Cardiovascular status: blood pressure returned to baseline and stable Postop Assessment: no apparent nausea or vomiting Anesthetic complications: no   No notable events documented.   Last Vitals:  Vitals:   02/01/21 1200 02/01/21 1217  BP: (!) 144/68 135/69  Pulse: 76 71  Resp: 16 14  Temp: 36.4 C (!) 36.2 C  SpO2: 93% 95%    Last Pain:  Vitals:   02/01/21 1217  TempSrc: Temporal  PainSc: [place]

## 2021-02-01 NOTE — Transfer of Care (Signed)
Immediate Anesthesia Transfer of Care Note  Patient: Zachary Sweeney  Procedure(s) Performed: XI ROBOT ASSISTED UMBILICAL HERNIA REPAIR (Abdomen) INSERTION OF MESH  Patient Location: PACU  Anesthesia Type:General  Level of Consciousness: awake  Airway & Oxygen Therapy: Patient Spontanous Breathing and Patient connected to face mask oxygen  Post-op Assessment: Report given to RN and Post -op Vital signs reviewed and stable  Post vital signs: Reviewed and stable  Last Vitals:  Vitals Value Taken Time  BP 156/96 02/01/21 0947  Temp    Pulse 78 02/01/21 0950  Resp 17 02/01/21 0950  SpO2 99 % 02/01/21 0950  Vitals shown include unvalidated device data.  Last Pain:  Vitals:   02/01/21 6002  TempSrc: Temporal  PainSc: 0-No pain         Complications: No notable events documented.

## 2021-02-01 NOTE — Discharge Instructions (Addendum)
  Diet: Resume home heart healthy regular diet.   Activity: No heavy lifting >20 pounds (children, pets, laundry, garbage) or strenuous activity until follow-up, but light activity and walking are encouraged. Do not drive or drink alcohol if taking narcotic pain medications.  Wound care: May shower with soapy water and pat dry (do not rub incisions), but no baths or submerging incision underwater until follow-up. (no swimming)   Medications: Resume all home medications. For mild to moderate pain: acetaminophen (Tylenol) ***or ibuprofen (if no kidney disease). Combining Tylenol with alcohol can substantially increase your risk of causing liver disease. Narcotic pain medications, if prescribed, can be used for severe pain, though may cause nausea, constipation, and drowsiness. Do not combine Tylenol and Norco within a 6 hour period as Norco contains Tylenol. If you do not need the narcotic pain medication, you do not need to fill the prescription.  Call office ([phone]) at any time if any questions, worsening pain, fevers/chills, bleeding, drainage from incision site, or other concerns.   AMBULATORY SURGERY  DISCHARGE INSTRUCTIONS   The drugs that you were given will stay in your system until tomorrow so for the next 24 hours you should not:  Drive an automobile Make any legal decisions Drink any alcoholic beverage   You may resume regular meals tomorrow.  Today it is better to start with liquids and gradually work up to solid foods.  You may eat anything you prefer, but it is better to start with liquids, then soup and crackers, and gradually work up to solid foods.   Please notify your doctor immediately if you have any unusual bleeding, trouble breathing, redness and pain at the surgery site, drainage, fever, or pain not relieved by medication.    Additional Instructions:        Please contact your physician with any problems or Same Day Surgery at [phone], Monday  through Friday 6 am to 4 pm, or [place] [facility] number at [phone].  

## 2021-03-29 ENCOUNTER — Inpatient Hospital Stay: Payer: Medicare Other

## 2021-03-29 ENCOUNTER — Inpatient Hospital Stay: Payer: Medicare Other | Admitting: Oncology

## 2021-03-29 ENCOUNTER — Inpatient Hospital Stay: Payer: Medicare Other | Attending: Oncology

## 2021-04-07 ENCOUNTER — Other Ambulatory Visit: Payer: Self-pay | Admitting: Oncology

## 2021-04-10 ENCOUNTER — Encounter: Payer: Self-pay | Admitting: Oncology

## 2021-04-11 ENCOUNTER — Telehealth: Payer: Self-pay

## 2021-04-11 NOTE — Telephone Encounter (Signed)
Spoke to pt, appts have been scheduled.

## 2021-04-11 NOTE — Telephone Encounter (Signed)
-----   Message from Earlie Server, MD sent at 04/10/2021 11:03 PM EST ----- I refilled his eliquis. He no showed on 2/8 please reschedule him to see me in 5 weeks.

## 2021-04-11 NOTE — Telephone Encounter (Signed)
Please schedule as requested by MD and inform pt of appt.

## 2021-04-25 ENCOUNTER — Emergency Department: Payer: Medicare Other

## 2021-04-25 ENCOUNTER — Other Ambulatory Visit: Payer: Self-pay

## 2021-04-25 ENCOUNTER — Inpatient Hospital Stay: Payer: Medicare Other

## 2021-04-25 ENCOUNTER — Inpatient Hospital Stay
Admission: EM | Admit: 2021-04-25 | Discharge: 2021-05-02 | DRG: 390 | Disposition: A | Discharge status: Home / Self Care | Payer: Medicare Other | Attending: Internal Medicine | Admitting: Internal Medicine

## 2021-04-25 DIAGNOSIS — Z20822 Contact with and (suspected) exposure to covid-19: Secondary | ICD-10-CM | POA: Diagnosis present

## 2021-04-25 DIAGNOSIS — R739 Hyperglycemia, unspecified: Secondary | ICD-10-CM | POA: Diagnosis present

## 2021-04-25 DIAGNOSIS — Z86718 Personal history of other venous thrombosis and embolism: Secondary | ICD-10-CM | POA: Diagnosis not present

## 2021-04-25 DIAGNOSIS — J438 Other emphysema: Secondary | ICD-10-CM | POA: Diagnosis present

## 2021-04-25 DIAGNOSIS — I10 Essential (primary) hypertension: Secondary | ICD-10-CM | POA: Diagnosis present

## 2021-04-25 DIAGNOSIS — Z801 Family history of malignant neoplasm of trachea, bronchus and lung: Secondary | ICD-10-CM | POA: Diagnosis not present

## 2021-04-25 DIAGNOSIS — J439 Emphysema, unspecified: Secondary | ICD-10-CM

## 2021-04-25 DIAGNOSIS — Z8042 Family history of malignant neoplasm of prostate: Secondary | ICD-10-CM | POA: Diagnosis not present

## 2021-04-25 DIAGNOSIS — Z79899 Other long term (current) drug therapy: Secondary | ICD-10-CM

## 2021-04-25 DIAGNOSIS — D72828 Other elevated white blood cell count: Secondary | ICD-10-CM | POA: Diagnosis present

## 2021-04-25 DIAGNOSIS — K219 Gastro-esophageal reflux disease without esophagitis: Secondary | ICD-10-CM | POA: Diagnosis present

## 2021-04-25 DIAGNOSIS — Z7901 Long term (current) use of anticoagulants: Secondary | ICD-10-CM | POA: Diagnosis not present

## 2021-04-25 DIAGNOSIS — K567 Ileus, unspecified: Secondary | ICD-10-CM | POA: Diagnosis not present

## 2021-04-25 DIAGNOSIS — K560 Paralytic ileus: Secondary | ICD-10-CM | POA: Diagnosis present

## 2021-04-25 DIAGNOSIS — E669 Obesity, unspecified: Secondary | ICD-10-CM | POA: Diagnosis present

## 2021-04-25 DIAGNOSIS — Z87891 Personal history of nicotine dependence: Secondary | ICD-10-CM

## 2021-04-25 DIAGNOSIS — E860 Dehydration: Secondary | ICD-10-CM | POA: Diagnosis present

## 2021-04-25 DIAGNOSIS — Z8249 Family history of ischemic heart disease and other diseases of the circulatory system: Secondary | ICD-10-CM | POA: Diagnosis not present

## 2021-04-25 DIAGNOSIS — I82431 Acute embolism and thrombosis of right popliteal vein: Secondary | ICD-10-CM | POA: Diagnosis present

## 2021-04-25 DIAGNOSIS — Z6839 Body mass index (BMI) 39.0-39.9, adult: Secondary | ICD-10-CM | POA: Diagnosis not present

## 2021-04-25 DIAGNOSIS — R109 Unspecified abdominal pain: Secondary | ICD-10-CM

## 2021-04-25 LAB — RESP PANEL BY RT-PCR (FLU A&B, COVID) ARPGX2
Influenza A by PCR: NEGATIVE
Influenza B by PCR: NEGATIVE
SARS Coronavirus 2 by RT PCR: NEGATIVE

## 2021-04-25 LAB — URINALYSIS, ROUTINE W REFLEX MICROSCOPIC
Bacteria, UA: NONE SEEN
Bilirubin Urine: NEGATIVE
Glucose, UA: NEGATIVE mg/dL
Ketones, ur: NEGATIVE mg/dL
Leukocytes,Ua: NEGATIVE
Nitrite: NEGATIVE
Protein, ur: NEGATIVE mg/dL
Specific Gravity, Urine: 1.006 (ref 1.005–1.030)
Squamous Epithelial / HPF: NONE SEEN (ref 0–5)
pH: 6 (ref 5.0–8.0)

## 2021-04-25 LAB — CBC
HCT: 55.1 % — ABNORMAL HIGH (ref 39.0–52.0)
Hemoglobin: 18.9 g/dL — ABNORMAL HIGH (ref 13.0–17.0)
MCH: 29.6 pg (ref 26.0–34.0)
MCHC: 34.3 g/dL (ref 30.0–36.0)
MCV: 86.4 fL (ref 80.0–100.0)
Platelets: 179 10*3/uL (ref 150–400)
RBC: 6.38 MIL/uL — ABNORMAL HIGH (ref 4.22–5.81)
RDW: 13.1 % (ref 11.5–15.5)
WBC: 12.5 10*3/uL — ABNORMAL HIGH (ref 4.0–10.5)
nRBC: 0 % (ref 0.0–0.2)

## 2021-04-25 LAB — COMPREHENSIVE METABOLIC PANEL
ALT: 25 U/L (ref 0–44)
AST: 34 U/L (ref 15–41)
Albumin: 4.5 g/dL (ref 3.5–5.0)
Alkaline Phosphatase: 53 U/L (ref 38–126)
Anion gap: 14 (ref 5–15)
BUN: 12 mg/dL (ref 8–23)
CO2: 23 mmol/L (ref 22–32)
Calcium: 10.2 mg/dL (ref 8.9–10.3)
Chloride: 96 mmol/L — ABNORMAL LOW (ref 98–111)
Creatinine, Ser: 1.14 mg/dL (ref 0.61–1.24)
GFR, Estimated: >60 mL/min (ref >60)
Glucose, Bld: 142 mg/dL — ABNORMAL HIGH (ref 70–99)
Potassium: 3.6 mmol/L (ref 3.5–5.1)
Sodium: 133 mmol/L — ABNORMAL LOW (ref 135–145)
Total Bilirubin: 1.4 mg/dL — ABNORMAL HIGH (ref 0.3–1.2)
Total Protein: 8.4 g/dL — ABNORMAL HIGH (ref 6.5–8.1)

## 2021-04-25 LAB — LIPASE, BLOOD: Lipase: 29 U/L (ref 11–51)

## 2021-04-25 MED ORDER — MORPHINE SULFATE (PF) 4 MG/ML IV SOLN
4.0000 mg | Freq: Once | INTRAVENOUS | Status: AC
  Administered 2021-04-25: 4 mg via INTRAVENOUS
  Filled 2021-04-25: qty 1

## 2021-04-25 MED ORDER — MORPHINE SULFATE (PF) 2 MG/ML IV SOLN
2.0000 mg | INTRAVENOUS | Status: DC | PRN
  Administered 2021-04-25 – 2021-04-30 (×15): 2 mg via INTRAVENOUS
  Filled 2021-04-25 (×17): qty 1

## 2021-04-25 MED ORDER — ALBUTEROL SULFATE (2.5 MG/3ML) 0.083% IN NEBU: 3.0000 mL | INHALATION_SOLUTION | RESPIRATORY_TRACT | Status: DC | PRN

## 2021-04-25 MED ORDER — ONDANSETRON HCL 4 MG/2ML IJ SOLN: 4.0000 mg | Freq: Four times a day (QID) | INTRAMUSCULAR | Status: DC | PRN

## 2021-04-25 MED ORDER — IOHEXOL 300 MG/ML  SOLN
100.0000 mL | Freq: Once | INTRAMUSCULAR | Status: AC | PRN
  Administered 2021-04-25: 100 mL via INTRAVENOUS
  Filled 2021-04-25: qty 100

## 2021-04-25 MED ORDER — MORPHINE SULFATE (PF) 2 MG/ML IV SOLN
2.0000 mg | Freq: Once | INTRAVENOUS | Status: AC
  Administered 2021-04-25: 2 mg via INTRAVENOUS

## 2021-04-25 MED ORDER — SODIUM CHLORIDE 0.9 % IV SOLN: INTRAVENOUS | Status: DC

## 2021-04-25 MED ORDER — ENOXAPARIN SODIUM 150 MG/ML IJ SOSY
1.0000 mg/kg | PREFILLED_SYRINGE | Freq: Two times a day (BID) | INTRAMUSCULAR | Status: DC
Start: 2021-04-25 — End: 2021-04-30
  Administered 2021-04-25 – 2021-04-29 (×9): 135 mg via SUBCUTANEOUS
  Filled 2021-04-25 (×11): qty 0.9

## 2021-04-25 MED ORDER — PHENOL 1.4 % MT LIQD
1.0000 | OROMUCOSAL | Status: DC | PRN
  Administered 2021-04-25 – 2021-04-27 (×6): 1 via OROMUCOSAL
  Filled 2021-04-25 (×2): qty 177

## 2021-04-25 MED ORDER — ACETAMINOPHEN 325 MG PO TABS: 650.0000 mg | ORAL_TABLET | Freq: Four times a day (QID) | ORAL | Status: DC | PRN

## 2021-04-25 MED ORDER — PANTOPRAZOLE SODIUM 40 MG IV SOLR
40.0000 mg | Freq: Every day | INTRAVENOUS | Status: DC
  Administered 2021-04-25 – 2021-05-01 (×7): 40 mg via INTRAVENOUS
  Filled 2021-04-25 (×9): qty 10

## 2021-04-25 MED ORDER — ONDANSETRON 4 MG PO TBDP: 4.0000 mg | ORAL_TABLET | Freq: Once | ORAL | Status: DC | PRN

## 2021-04-25 MED ORDER — HYDRALAZINE HCL 20 MG/ML IJ SOLN
10.0000 mg | Freq: Four times a day (QID) | INTRAMUSCULAR | Status: DC | PRN
  Administered 2021-04-25 – 2021-04-26 (×2): 10 mg via INTRAVENOUS
  Filled 2021-04-25 (×2): qty 1

## 2021-04-25 MED ORDER — UMECLIDINIUM-VILANTEROL 62.5-25 MCG/ACT IN AEPB
1.0000 | INHALATION_SPRAY | Freq: Every day | RESPIRATORY_TRACT | Status: DC
  Administered 2021-04-26 – 2021-05-02 (×7): 1 via RESPIRATORY_TRACT
  Filled 2021-04-25 (×2): qty 14

## 2021-04-25 MED ORDER — ONDANSETRON HCL 4 MG/2ML IJ SOLN
4.0000 mg | Freq: Once | INTRAMUSCULAR | Status: AC
  Administered 2021-04-25: 4 mg via INTRAVENOUS
  Filled 2021-04-25: qty 2

## 2021-04-25 MED ORDER — ACETAMINOPHEN 650 MG RE SUPP: 650.0000 mg | Freq: Four times a day (QID) | RECTAL | Status: DC | PRN

## 2021-04-25 MED ORDER — ONDANSETRON HCL 4 MG PO TABS: 4.0000 mg | ORAL_TABLET | Freq: Four times a day (QID) | ORAL | Status: DC | PRN

## 2021-04-25 NOTE — Consult Note (Signed)
Zachary Sweeney , MD [address] [place], [place], [place], [address] [id number], [place], [place], [place], [address] Phone: [phone]  Fax: [phone]  Consultation  Referring Provider:     Dr Francine Graven Primary Care Physician:  Alene Mires [name], MD Primary Gastroenterologist:  Dr. Rae [facility]          Reason for Consultation:     Ileus   Date of Admission:  04/25/2021 Date of Consultation:  04/25/2021         HPI:   Zachary Sweeney is a 76 y.o. male who was previously been seen by Dr. Bonna Gains for dyspeptic symptoms.  Endoscopy and colonoscopy in 2020 showed features suggestive of Barrett's esophagus and polyps taken off in the colon.  Patient comes into the emergency room with a history of abdominal pain bloating and swelling for the past 5 days and >  3 days with constipation.  Took stool softeners Dulcolax and laxatives.  Threw up at home.  In the ER had a CT abdomen with contrast Mild to moderate dilation of small bowel loops diffusely seen without evidence of transition point mild colonic distention also seen containing air-fluid levels throughout consistent with ileus.Presently still complains of abdominal pain , passing a l;ot of gas, has had 2 episodes of emesis since admission. Generalized abdominal discomfort.   Hemoglobin 18.9 g, creatinine 1.14, chloride 96. Past Medical History:  Diagnosis Date   Acid reflux    Back injury    Colon polyp    DVT (deep venous thrombosis) ([place])    a. 06/2017 LE U/S: Right popliteal vein DVT.   Dyspnea on exertion    a. 06/2017 CTA Chest: No PE. No significant coronary Ca2+, centrilobular and paraseptal emphysema;  b.  07/2017 Echo: EF 60-65%, no rwma, Gr1 DD, mildly dil LA. Nl RV fxn; c. 07/2017 MV: Small, severe defect involving apical inf and apical segments - only on rest images consistent w/ artifact. No ischemia or scar.   Emphysema of lung ([place])    a. 06/2017 CTA Chest: Centrilobular and paraseptal emphysema, with  mild geographic ground-glass opacity likely representing small airway dzs.   Erythrocytosis 04/17/2019   Hypertension    Sleep apnea    Torn ligament     Past Surgical History:  Procedure Laterality Date   COLONOSCOPY  2008   COLONOSCOPY WITH PROPOFOL N/A 02/27/2017   Procedure: COLONOSCOPY WITH PROPOFOL;  Surgeon: Robert Bellow, MD;  Location: [facility] END[facility]COPY;  Service: Endoscopy;  Laterality: N/A;   COLONOSCOPY WITH PROPOFOL N/A 07/24/2018   Procedure: COLONOSCOPY WITH PROPOFOL;  Surgeon: Virgel Manifold, MD;  Location: [facility]COPY;  Service: Endoscopy;  Laterality: N/A;   ESOPHAGOGASTRODUODENOSCOPY (EGD) WITH PROPOFOL N/A 02/27/2017   Procedure: ESOPHAGOGASTRODUODENOSCOPY (EGD) WITH PROPOFOL;  Surgeon: Robert Bellow, MD;  Location: [facility];  Service: Endoscopy;  Laterality: N/A;   ESOPHAGOGASTRODUODENOSCOPY (EGD) WITH PROPOFOL N/A 07/24/2018   Procedure: ESOPHAGOGASTRODUODENOSCOPY (EGD) WITH PROPOFOL;  Surgeon: Virgel Manifold, MD;  Location: [facility];  Service: Endoscopy;  Laterality: N/A;   HERNIA REPAIR Right    inguinal   INSERTION OF MESH  02/01/2021   Procedure: INSERTION OF MESH;  Surgeon: Herbert Pun, MD;  Location: [facility] ORS;  Service: General;;   KNEE SURGERY Left    NECK SURGERY     ROTATOR CUFF REPAIR Right    UMBILICAL HERNIA REPAIR N/A 04/22/2019   Procedure: HERNIA REPAIR UMBILICAL ADULT;  Surgeon: Ronny Bacon, MD;  Location: [facility] ORS;  Service:  General;  Laterality: N/A;    Prior to Admission medications   Medication Sig Start Date End Date Taking? Authorizing Provider  acetaminophen (TYLENOL) 500 MG tablet Take 1,000 mg by mouth every 6 (six) hours as needed for fever.    [provider]  albuterol (VENTOLIN HFA) 108 (90 Base) MCG/ACT inhaler 2 puffs every 4 (four) hours as needed for shortness of breath or wheezing. 07/08/19   [provider]  clotrimazole-betamethasone (LOTRISONE) cream Apply 1 application  topically 2 (two) times daily. 05/25/20   McDonald, Stephan Minister, DPM  diltiazem (CARDIZEM CD) 300 MG 24 hr capsule Take 1 capsule (300 mg total) by mouth daily. 09/04/17   Theora Gianotti, NP  ELIQUIS 2.5 MG TABS tablet TAKE 1 TABLET(2.5 MG) BY MOUTH TWICE DAILY 04/10/21   Earlie Server, MD  fluocinonide (LIDEX) 0.05 % external solution Apply 1 application topically daily.    [provider]  ibuprofen (ADVIL) 200 MG tablet Take 200 mg by mouth every 6 (six) hours as needed for moderate pain.    [provider]  omeprazole (PRILOSEC) 20 MG capsule Take 1 capsule (20 mg total) by mouth daily. Patient taking differently: Take 20 mg by mouth daily. 09/06/17 02/01/21  Flora Lipps, MD  umeclidinium-vilanterol (ANORO ELLIPTA) 62.5-25 MCG/INH AEPB Inhale 1 puff into the lungs daily. 02/03/20   [provider]    Family History  Problem Relation Age of Onset   Lung cancer Mother    Heart disease Father    Prostate cancer Maternal Uncle      Social History   Tobacco Use   Smoking status: Former    Packs/day: 0.25    Years: 30.00    Pack years: 7.50    Types: Cigarettes    Quit date: 02/19/1994    Years since quitting: 27.1   Smokeless tobacco: Never  Vaping Use   Vaping Use: Never used  Substance Use Topics   Alcohol use: No   Drug use: No    Allergies as of 04/25/2021   (No Known Allergies)    Review of Systems:    All systems reviewed and negative except where noted in HPI.   Physical Exam:  Vital signs in last 24 hours: Temp:  [97.7 F (36.5 C)] 97.7 F (36.5 C) (03/07 1237) Pulse Rate:  [103-112] 103 (03/07 1400) Resp:  [18-20] 20 (03/07 1400) BP: (144-167)/(95-99) 144/95 (03/07 1400) SpO2:  [91 %-96 %] 91 % (03/07 1400) Weight:  [891 kg] 136 kg (03/07 1246)   General:    cooperative, complains of generalized abdominal discomfort.  Head:  Normocephalic and atraumatic. Eyes:   No icterus.   Conjunctiva pink. PERRLA. Ears:  Normal auditory  acuity. Neck:  Supple; no masses or thyroidomegaly Lungs: Respirations even and unlabored. Lungs clear to auscultation bilaterally.   No wheezes, crackles, or rhonchi.  Heart:  Regular rate and rhythm;  Without murmur, clicks, rubs or gallops Abdomen:  Genertalized distension,  nontender. Diminished bowel sounds. No appreciable masses or hepatomegaly.  No rebound or guarding. Tympanic on percussion. With chaperon in room : rectal exam performed, rectal vault empty, no masses or obstruction felt.  Neurologic:  Alert and oriented x3;  grossly normal neurologically. Marland Kitchen Psych:  Alert and cooperative. Normal affect.  LAB RESULTS: Recent Labs    04/25/21 1238  WBC 12.5*  HGB 18.9*  HCT 55.1*  PLT 179   BMET Recent Labs    04/25/21 1238  NA 133*  K 3.6  CL  96*  CO2 23  GLUCOSE 142*  BUN 12  CREATININE 1.14  CALCIUM 10.2   LFT Recent Labs    04/25/21 1238  PROT 8.4*  ALBUMIN 4.5  AST 34  ALT 25  ALKPHOS 53  BILITOT 1.4*   PT/INR No results for input(s): LABPROT, INR in the last 72 hours.  STUDIES: DG Abdomen 1 View  Result Date: 04/25/2021 CLINICAL DATA:  Constipation for 3 days, nausea and vomiting today EXAM: ABDOMEN - 1 VIEW COMPARISON:  None FINDINGS: Gaseous distension of colon and some small bowel loops. No gas within rectum or distal sigmoid colon. Pattern could reflect ileus or distal colonic obstruction. No bowel wall thickening. Osseous structures demineralized. IMPRESSION: Gaseous distention of large and small bowel loops without definite gas in rectum or distal sigmoid colon, question ileus versus distal colonic obstruction. Electronically Signed   By: Lavonia Dana M.D.   On: 04/25/2021 13:19   CT ABDOMEN PELVIS W CONTRAST  Result Date: 04/25/2021 CLINICAL DATA:  Abdominal pain, nausea and vomiting, and constipation for several days. EXAM: CT ABDOMEN AND PELVIS WITH CONTRAST TECHNIQUE: Multidetector CT imaging of the abdomen and pelvis was performed using the  standard protocol following bolus administration of intravenous contrast. RADIATION DOSE REDUCTION: This exam was performed according to the departmental dose-optimization program which includes automated exposure control, adjustment of the mA and/or kV according to patient size and/or use of iterative reconstruction technique. CONTRAST:  178m OMNIPAQUE IOHEXOL 300 MG/ML  SOLN COMPARISON:  01/18/2021 FINDINGS: Lower Chest: No acute findings. Hepatobiliary: No hepatic masses identified. Gallbladder is unremarkable. No evidence of biliary ductal dilatation. Pancreas:  No mass or inflammatory changes. Spleen: Within normal limits in size and appearance. Adrenals/Urinary Tract: No masses identified. Tiny cyst noted in the medial midpole of the right kidney. No evidence of ureteral calculi or hydronephrosis. Stomach/Bowel: Mild-to-moderate dilatation of small bowel loops is seen diffusely, without evidence of transition point. Mild colonic distention is also seen containing air-fluid levels throughout. This is most consistent with an adynamic ileus. No evidence of bowel wall thickening or focal inflammatory process. Vascular/Lymphatic: No pathologically enlarged lymph nodes. No acute vascular findings. Aortic atherosclerotic calcification noted. Reproductive:  No mass or other significant abnormality. Other:  None. Musculoskeletal:  No suspicious bone lesions identified. IMPRESSION: Diffuse small bowel dilatation and colonic distention, most consistent with adynamic ileus. No evidence of focal inflammatory process, abscess, or mass. Aortic Atherosclerosis (ICD10-I70.0). Electronically Signed   By: JMarlaine HindM.D.   On: 04/25/2021 14:28      Impression / Plan:   EANDRANIK JEUNEis a 76y.o. y/o male presents to the emergency room with abdominal pain nausea vomiting and distention.  CT scan of the abdomen shows features of small bowel ileus and no obstruction.  Labs indicate dehydration.  Plan 1.  Conservative  management with bowel rest ,  NG tube.  Serial abdominal exams.  If there is any change for the worse with abdominal exam repeat imaging.  2.  Limit narcotics, continue IV fluids monitor electrolytes including magnesium potassium chloride and replace as required.  No indication for a GI procedure at this point of time.  If does not resolve in 24 to 48 hours will need to consider a Gastrografin small bowel follow-through which is diagnostic as well as therapeutic to help with bowel edema .   Thank you for involving me in the care of this patient.      LOS: 0 days   KJonathon Bellows MD  04/25/2021, 3:09 PM

## 2021-04-25 NOTE — H&P (Addendum)
History and Physical    Patient: Zachary Sweeney EXB:[id number] DOB: 1945/10/31 DOA: 04/25/2021 DOS: the patient was seen and examined on 04/25/2021 PCP: Zachary Burrow, MD  Patient coming from: Home  Chief Complaint:  Chief Complaint  Patient presents with   Abdominal Pain   Emesis   Constipation   HPI: Zachary Sweeney is a 76 y.o. male with medical history significant for GERD, hernia repair, DVT, COPD, sleep apnea, hypertension who presents to the ER for evaluation of a 5-day history of constipation, diffuse abdominal pain rated 10 x 10 in intensity at its worst, nausea and vomiting. Patient states he thinks " he may have a blockage". He states that he normally has 3-4 bowel movements a day but over the last 5 days has not had a BM.  He has taken multiple laxatives without any results.  On the day of admission he did have some leakage of loose stools without any relief.  He had 2 episodes of emesis on the day of admission and remains nauseous. He denies having any fever or chills, no cough, no dizziness, no lightheadedness, no headache, no blurred vision, no chest pain, no shortness of breath, no urinary symptoms. He had a CT scan of abdomen and pelvis done in the ER which showed  diffuse small bowel dilatation and colonic distention, most consistent with adynamic ileus. He will be admitted to the hospital for further evaluation. Review of Systems: As mentioned in the history of present illness. All other systems reviewed and are negative. Past Medical History:  Diagnosis Date   Acid reflux    Back injury    Colon polyp    DVT (deep venous thrombosis) ([place])    a. 06/2017 LE U/S: Right popliteal vein DVT.   Dyspnea on exertion    a. 06/2017 CTA Chest: No PE. No significant coronary Ca2+, centrilobular and paraseptal emphysema;  b.  07/2017 Echo: EF 60-65%, no rwma, Gr1 DD, mildly dil LA. Nl RV fxn; c. 07/2017 MV: Small, severe defect involving apical inf and apical segments -  only on rest images consistent w/ artifact. No ischemia or scar.   Emphysema of lung ([place])    a. 06/2017 CTA Chest: Centrilobular and paraseptal emphysema, with mild geographic ground-glass opacity likely representing small airway dzs.   Erythrocytosis 04/17/2019   Hypertension    Sleep apnea    Torn ligament    Past Surgical History:  Procedure Laterality Date   COLONOSCOPY  2008   COLONOSCOPY WITH PROPOFOL N/A 02/27/2017   Procedure: COLONOSCOPY WITH PROPOFOL;  Surgeon: Zachary Bellow, MD;  Location: [facility];  Service: Endoscopy;  Laterality: N/A;   COLONOSCOPY WITH PROPOFOL N/A 07/24/2018   Procedure: COLONOSCOPY WITH PROPOFOL;  Surgeon: Zachary Manifold, MD;  Location: [facility];  Service: Endoscopy;  Laterality: N/A;   ESOPHAGOGASTRODUODENOSCOPY (EGD) WITH PROPOFOL N/A 02/27/2017   Procedure: ESOPHAGOGASTRODUODENOSCOPY (EGD) WITH PROPOFOL;  Surgeon: Zachary Bellow, MD;  Location: [facility];  Service: Endoscopy;  Laterality: N/A;   ESOPHAGOGASTRODUODENOSCOPY (EGD) WITH PROPOFOL N/A 07/24/2018   Procedure: ESOPHAGOGASTRODUODENOSCOPY (EGD) WITH PROPOFOL;  Surgeon: Zachary Manifold, MD;  Location: [facility];  Service: Endoscopy;  Laterality: N/A;   HERNIA REPAIR Right    inguinal   INSERTION OF MESH  02/01/2021   Procedure: INSERTION OF MESH;  Surgeon: Zachary Pun, MD;  Location: [facility];  Service: General;;   KNEE SURGERY Left    NECK SURGERY     ROTATOR CUFF REPAIR Right  UMBILICAL HERNIA REPAIR N/A 04/22/2019   Procedure: HERNIA REPAIR UMBILICAL ADULT;  Surgeon: Zachary Bacon, MD;  Location: [facility];  Service: General;  Laterality: N/A;   Social History:  reports that he quit smoking about 27 years ago. His smoking use included cigarettes. He has a 7.50 pack-year smoking history. He has never used smokeless tobacco. He reports that he does not drink alcohol and does not use drugs.  No Known Allergies  Family History  Problem Relation  Age of Onset   Lung cancer Mother    Heart disease Father    Prostate cancer Maternal Uncle     Prior to Admission medications   Medication Sig Start Date End Date Taking? Authorizing Provider  acetaminophen (TYLENOL) 500 MG tablet Take 1,000 mg by mouth every 6 (six) hours as needed for fever.    [provider]  albuterol (VENTOLIN HFA) 108 (90 Base) MCG/ACT inhaler 2 puffs every 4 (four) hours as needed for shortness of breath or wheezing. 07/08/19   [provider]  clotrimazole-betamethasone (LOTRISONE) cream Apply 1 application topically 2 (two) times daily. 05/25/20   Sweeney, Zachary Minister, DPM  diltiazem (CARDIZEM CD) 300 MG 24 hr capsule Take 1 capsule (300 mg total) by mouth daily. 09/04/17   Theora Gianotti, NP  ELIQUIS 2.5 MG TABS tablet TAKE 1 TABLET(2.5 MG) BY MOUTH TWICE DAILY 04/10/21   Earlie Server, MD  fluocinonide (LIDEX) 0.05 % external solution Apply 1 application topically daily.    [provider]  ibuprofen (ADVIL) 200 MG tablet Take 200 mg by mouth every 6 (six) hours as needed for moderate pain.    [provider]  omeprazole (PRILOSEC) 20 MG capsule Take 1 capsule (20 mg total) by mouth daily. Patient taking differently: Take 20 mg by mouth daily. 09/06/17 02/01/21  Flora Lipps, MD  umeclidinium-vilanterol (ANORO ELLIPTA) 62.5-25 MCG/INH AEPB Inhale 1 puff into the lungs daily. 02/03/20   [provider]    Physical Exam: Vitals:   04/25/21 1237 04/25/21 1246 04/25/21 1400  BP: (!) 167/99  (!) 144/95  Pulse: (!) 112  (!) 103  Resp: 18  20  Temp: 97.7 F (36.5 C)    TempSrc: Oral    SpO2: 96%  91%  Weight:  136 kg   Height:  '6\' 1"'$  (1.854 m)    Physical Exam Vitals and nursing note reviewed.  Constitutional:      Appearance: He is obese.  HENT:     Head: Normocephalic and atraumatic.     Mouth/Throat:     Comments: Dry mucous membranes Eyes:     Extraocular Movements: Extraocular movements intact.   Cardiovascular:     Rate and Rhythm: Tachycardia present.  Pulmonary:     Effort: Pulmonary effort is normal.  Abdominal:     General: Bowel sounds are increased. There is distension.     Tenderness: There is generalized abdominal tenderness.     Comments: Diffusely tender, distended, hypoactive bowel sounds  Skin:    General: Skin is warm and dry.  Neurological:     General: No focal deficit present.     Mental Status: He is alert.  Psychiatric:        Mood and Affect: Mood normal.        Behavior: Behavior normal.    Data Reviewed: Relevant notes from primary care and specialist visits, past discharge summaries as available in EHR, including Care Everywhere. Prior diagnostic testing as pertinent to current admission diagnoses Updated medications  and problem lists for reconciliation ED course, including vitals, labs, imaging, treatment and response to treatment Triage notes, nursing and pharmacy notes and ED provider's notes Notable results as noted in HPI Labs reviewed.  Sodium 133, potassium 3.6, chloride 96, glucose 142, BUN 12, creatinine 1.14, white count 12.5, hemoglobin 18.9, platelet count 179 CT scan of abdomen and pelvis shows Diffuse small bowel dilatation and colonic distention, most consistent with adynamic ileus. No evidence of focal inflammatory process, abscess, or mass. Abdominal x-ray shows gaseous distention of large and small bowel loops without definite gas in rectum or distal sigmoid colon, question ileus versus distal colonic obstruction. There are no new results to review at this time.  Assessment and Plan: * Ileus ([place]) Patient presents to the ER for evaluation of diffuse abdominal pain associated with constipation, nausea and vomiting. He has a history of recent hernia repair CT scan of abdomen and pelvis shows diffuse small bowel dilatation and colonic distention, most consistent with adynamic ileus. 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  All questions and concerns have been addressed.  They verbalized understanding and agree with the plan.  Severity of Illness: The appropriate patient status for this patient is INPATIENT. Inpatient status is judged to be reasonable and necessary in order to provide the required intensity of service to ensure the patient's safety. The patient's presenting symptoms, physical exam findings, and initial radiographic and laboratory data in the context of their chronic comorbidities is felt to place them at high risk for further clinical deterioration. Furthermore, it is not anticipated that the patient will be medically stable for discharge from the hospital within 2 midnights of admission.   * I certify that at the point of admission it is my clinical judgment that the patient will require inpatient hospital care spanning beyond 2 midnights from the point of admission due to high intensity of service, high risk for further deterioration and high frequency of  surveillance required.*  Author: Collier Bullock, MD 04/25/2021 3:40 PM  For on call review [email or web address].

## 2021-04-25 NOTE — Assessment & Plan Note (Signed)
Stable and not acutely exacerbated Continue as needed bronchodilator therapy and inhaled steroids 

## 2021-04-25 NOTE — Assessment & Plan Note (Addendum)
IV PPI until taking PO

## 2021-04-25 NOTE — ED Provider Notes (Addendum)
? ?[facility] ?Provider Note ? ? ? Event Date/Time  ? First MD Initiated Contact with Patient 04/25/21 1249   ?  (approximate) ? ? ?History  ? ?Abdominal Pain, Emesis, and Constipation ? ? ?HPI ? ?Zachary Sweeney is a 76 y.o. male with history of abdominal hernia, htn, diverticulosis presents with abdominal pain, bloating and swelling of the abdomen, constipation for 3 days, now have watery stool but thinks there may be a blockage.  Took stool softener, ducoloax, and another laxative without relief.  Denies fever/chills.  Did have an episode of vomiting which made him very concerned about a blockage ? ?  ? ? ?Physical Exam  ? ?Triage Vital Signs: ?ED Triage Vitals  ?Enc Vitals Group  ?   BP 04/25/21 1237 (!) 167/99  ?   Pulse Rate 04/25/21 1237 (!) 112  ?   Resp 04/25/21 1237 18  ?   Temp 04/25/21 1237 97.7 ?F (36.5 ?C)  ?   Temp Source 04/25/21 1237 Oral  ?   SpO2 04/25/21 1237 96 %  ?   Weight 04/25/21 1246 299 lb 13.2 oz (136 kg)  ?   Height 04/25/21 1246 '6\' 1"'$  (1.854 m)  ?   Head Circumference --   ?   Peak Flow --   ?   Pain Score --   ?   Pain Loc --   ?   Pain Edu? --   ?   Excl. in [address]? --   ? ? ?Most recent vital signs: ?Vitals:  ? 04/25/21 1237 04/25/21 1400  ?BP: (!) 167/99 (!) 144/95  ?Pulse: (!) 112 (!) 103  ?Resp: 18 20  ?Temp: 97.7 ?F (36.5 ?C)   ?SpO2: 96% 91%  ? ? ? ?General: Awake, no distress.   ?CV:  Good peripheral perfusion.  Tachycardic ?Resp:  Normal effort. Lungs cta ?Abd:  Abdomen is distended, tender, high pitched bowel sounds  ?Other:   ? ? ?ED Results / Procedures / Treatments  ? ?Labs ?(all labs ordered are listed, but only abnormal results are displayed) ?Labs Reviewed  ?COMPREHENSIVE METABOLIC PANEL - Abnormal; Notable for the following components:  ?    Result Value  ? Sodium 133 (*)   ? Chloride 96 (*)   ? Glucose, Bld 142 (*)   ? Total Protein 8.4 (*)   ? Total Bilirubin 1.4 (*)   ? All other components within normal limits  ?CBC - Abnormal; Notable for the  following components:  ? WBC 12.5 (*)   ? RBC 6.38 (*)   ? Hemoglobin 18.9 (*)   ? HCT 55.1 (*)   ? All other components within normal limits  ?URINALYSIS, ROUTINE W REFLEX MICROSCOPIC - Abnormal; Notable for the following components:  ? Color, Urine YELLOW (*)   ? APPearance CLEAR (*)   ? Hgb urine dipstick SMALL (*)   ? All other components within normal limits  ?RESP PANEL BY RT-PCR (FLU A&B, COVID) ARPGX2  ?LIPASE, BLOOD  ? ? ? ?EKG ? ? ? ? ?RADIOLOGY ?Abdomen 1 view ?CT abd pelvis with iv contrast ? ? ? ?PROCEDURES: ? ? ?Procedures ? ? ?MEDICATIONS ORDERED IN ED: ?Medications  ?ondansetron (ZOFRAN-ODT) disintegrating tablet 4 mg (has no administration in time range)  ?enoxaparin (LOVENOX) injection 135 mg (has no administration in time range)  ?albuterol (PROVENTIL) (2.5 MG/3ML) 0.083% nebulizer solution 3 mL (has no administration in time range)  ?umeclidinium-vilanterol (ANORO ELLIPTA) 62.5-25 MCG/ACT 1 puff (has no administration in  time range)  ?0.9 %  sodium chloride infusion (has no administration in time range)  ?acetaminophen (TYLENOL) tablet 650 mg (has no administration in time range)  ?  Or  ?acetaminophen (TYLENOL) suppository 650 mg (has no administration in time range)  ?ondansetron (ZOFRAN) tablet 4 mg (has no administration in time range)  ?  Or  ?ondansetron (ZOFRAN) injection 4 mg (has no administration in time range)  ?pantoprazole (PROTONIX) injection 40 mg (has no administration in time range)  ?morphine (PF) 4 MG/ML injection 4 mg (4 mg Intravenous Given 04/25/21 1340)  ?ondansetron (ZOFRAN) injection 4 mg (4 mg Intravenous Given 04/25/21 1340)  ?iohexol (OMNIPAQUE) 300 MG/ML solution 100 mL (100 mLs Intravenous Contrast Given 04/25/21 1409)  ? ? ? ?IMPRESSION / MDM / ASSESSMENT AND PLAN / ED COURSE  ?I reviewed the triage vital signs and the nursing notes. ?             ?               ? ?Differential diagnosis includes, but is not limited to, SBO, diverticulitis, ileus, constipation ? ?Labs  with elevated WBC of 12.5, while H&H are elevated, lipase is normal, comprehensive metabolic panel has an elevated glucose but is otherwise normal for the patient, ua is negative for infection  ? ?X-ray of the abdomen 1 view shows dilated loops of bowel, concerns of ileus versus blockage, this was independently reviewed by me.  Confirmed by radiology ? ?CT abdomen/pelvis with IV contrast ordered ?CT abdomen/pelvis was reviewed by me.  Does show ileus ? ?Patient continues to have a lot of abdominal pain.  Will admit for ileus.  Pain control.  Consult to gastroenterology.  Consult hospitalist consult to surgery ? ?Spoke with Dr. Francine Graven, will be admitting the patient.  He is in stable condition.  Patient is still complaining of pain.  She is requesting we consult surgery.  Secure message was sent to Dr. Dahlia Byes for consult ?Dr Corky Downs had advised to talk with GI, Dr Vicente Males will consult on floor ?Dr Dahlia Byes recommends NG tube.   ? ? ?  ? ? ?FINAL CLINICAL IMPRESSION(S) / ED DIAGNOSES  ? ?Final diagnoses:  ?Ileus ([place])  ? ? ? ?Rx / DC Orders  ? ?ED Discharge Orders   ? ? None  ? ?  ? ? ? ?Note:  This document was prepared using Dragon voice recognition software and may include unintentional dictation errors. ? ?  ?Versie Starks, PA-C ?04/25/21 1507 ? ?  ?Versie Starks, PA-C ?04/25/21 1513 ? ?  ?Lavonia Drafts, MD ?04/28/21 [phone] ? ?

## 2021-04-25 NOTE — ED Notes (Signed)
Pt placed on2L O2 via [place] secondary to low O2 sat, pt satting 88% on room air, 91% on 2L ?

## 2021-04-25 NOTE — Plan of Care (Signed)

## 2021-04-25 NOTE — Assessment & Plan Note (Addendum)
BP uncontrolled due to pain, PO meds held. As needed IV hydralazine. Continue home Cardizem

## 2021-04-25 NOTE — Assessment & Plan Note (Addendum)
Patient presents to the ER for evaluation of diffuse abdominal pain associated with constipation, nausea and vomiting. He has a history of recent hernia repair CT scan of abdomen and pelvis shows diffuse small bowel dilatation and colonic distention, most consistent with adynamic ileus.  3/13: Had increased distention and abdominal pain with advancement of diet yesterday.  Surgery again made n.p.o. then advance to clears, advancing to full liquids this afternoon.  --NG tube has been removed --Diet advancement per general surgery --Pain control as needed --Antiemetics as needed --IV PPI, switch back to oral if tolerating tomorrow -- Off IV fluids --General surgery following, appreciate recommendations

## 2021-04-25 NOTE — Consult Note (Signed)
Patient ID: Zachary Sweeney, male   DOB: 05-26-1945, 76 y.o.   MRN: [id number]  HPI Zachary Sweeney is a 76 y.o. male seen in consultation at the request of Ms. Fisher PA C.  Comes in with a 4 to 5-day history of abdominal pain.  He also reports nausea and vomiting.  The pain is severe intermittent and colicky type.  Pain is also diffuse and does not radiate.  He also has associated diarrhea.  While here in the ER he had multiple episode of diarrhea.  He also has taken multiple laxative.  History is taken from both the patient self and the daughter. No history of hematochezia or melena. He did have a CT scan of the abdomen pelvis that I personally reviewed showing evidence of diffuse bowel dilation in the small intestine and large intestine.  There is no evidence of a discrete transition point Hemoglobin 18.9 g, creatinine 1.14, chloride 96. WBC 12.5. Does have surgical history significant for initial repair of an umbilical hernia by Dr. Christian Mate 2 years ago.  He did have recurrence and had another repair this time robotically with mesh by Dr. Peyton Najjar. 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 He does have dyspnea on exertion.  He is not very active. Does have history of COPD.     HPI  Past Medical History:  Diagnosis Date   Acid reflux    Back injury    Colon polyp    DVT (deep venous thrombosis) ([place])    a. 06/2017 LE U/S: Right popliteal vein DVT.   Dyspnea on exertion    a. 06/2017 CTA Chest: No PE. No significant coronary Ca2+, centrilobular and paraseptal emphysema;  b.  07/2017 Echo: EF 60-65%, no rwma, Gr1 DD, mildly dil LA. Nl RV fxn; c. 07/2017 MV: Small, severe defect involving apical inf and apical segments - only on rest images consistent w/ artifact. No ischemia or scar.   Emphysema of lung ([place])    a. 06/2017 CTA Chest: Centrilobular and paraseptal emphysema, with mild geographic ground-glass opacity likely representing small airway dzs.   Erythrocytosis 04/17/2019    Hypertension    Sleep apnea    Torn ligament     Past Surgical History:  Procedure Laterality Date   COLONOSCOPY  2008   COLONOSCOPY WITH PROPOFOL N/A 02/27/2017   Procedure: COLONOSCOPY WITH PROPOFOL;  Surgeon: Robert Bellow, MD;  Location: [facility] ENDOSCOPY;  Service: Endoscopy;  Laterality: N/A;   COLONOSCOPY WITH PROPOFOL N/A 07/24/2018   Procedure: COLONOSCOPY WITH PROPOFOL;  Surgeon: Virgel Manifold, MD;  Location: [facility] ENDOSCOPY;  Service: Endoscopy;  Laterality: N/A;   ESOPHAGOGASTRODUODENOSCOPY (EGD) WITH PROPOFOL N/A 02/27/2017   Procedure: ESOPHAGOGASTRODUODENOSCOPY (EGD) WITH PROPOFOL;  Surgeon: Robert Bellow, MD;  Location: [facility] ENDOSCOPY;  Service: Endoscopy;  Laterality: N/A;   ESOPHAGOGASTRODUODENOSCOPY (EGD) WITH PROPOFOL N/A 07/24/2018   Procedure: ESOPHAGOGASTRODUODENOSCOPY (EGD) WITH PROPOFOL;  Surgeon: Virgel Manifold, MD;  Location: [facility] ENDOSCOPY;  Service: Endoscopy;  Laterality: N/A;   HERNIA REPAIR Right    inguinal   INSERTION OF MESH  02/01/2021   Procedure: INSERTION OF MESH;  Surgeon: Herbert Pun, MD;  Location: [facility] ORS;  Service: General;;   KNEE SURGERY Left    NECK SURGERY     ROTATOR CUFF REPAIR Right    UMBILICAL HERNIA REPAIR N/A 04/22/2019   Procedure: HERNIA REPAIR UMBILICAL ADULT;  Surgeon: Ronny Bacon, MD;  Location: [facility] ORS;  Service: General;  Laterality: N/A;    Family  History  Problem Relation Age of Onset   Lung cancer Mother    Heart disease Father    Prostate cancer Maternal Uncle     Social History Social History   Tobacco Use   Smoking status: Former    Packs/day: 0.25    Years: 30.00    Pack years: 7.50    Types: Cigarettes    Quit date: 02/19/1994    Years since quitting: 27.1   Smokeless tobacco: Never  Vaping Use   Vaping Use: Never used  Substance Use Topics   Alcohol use: No   Drug use: No    No Known Allergies  Current Facility-Administered Medications  Medication Dose Route Frequency  Provider Last Rate Last Admin   0.9 %  sodium chloride infusion   Intravenous Continuous Agbata, Tochukwu, MD       acetaminophen (TYLENOL) tablet 650 mg  650 mg Oral Q6H PRN Agbata, Tochukwu, MD       Or   acetaminophen (TYLENOL) suppository 650 mg  650 mg Rectal Q6H PRN Agbata, Tochukwu, MD       albuterol (PROVENTIL) (2.5 MG/3ML) 0.083% nebulizer solution 3 mL  3 mL Inhalation Q4H PRN Agbata, Tochukwu, MD       enoxaparin (LOVENOX) injection 135 mg  1 mg/kg Subcutaneous Q12H Agbata, Tochukwu, MD       hydrALAZINE (APRESOLINE) injection 10 mg  10 mg Intravenous Q6H PRN Agbata, Tochukwu, MD       morphine (PF) 2 MG/ML injection 2 mg  2 mg Intravenous Q4H PRN Agbata, Tochukwu, MD   2 mg at 04/25/21 1530   ondansetron (ZOFRAN) tablet 4 mg  4 mg Oral Q6H PRN Agbata, Tochukwu, MD       Or   ondansetron (ZOFRAN) injection 4 mg  4 mg Intravenous Q6H PRN Agbata, Tochukwu, MD       pantoprazole (PROTONIX) injection 40 mg  40 mg Intravenous Daily Agbata, Tochukwu, MD   40 mg at 04/25/21 1601   phenol (CHLORASEPTIC) mouth spray 1 spray  1 spray Mouth/Throat PRN Agbata, Tochukwu, MD       umeclidinium-vilanterol (ANORO ELLIPTA) 62.5-25 MCG/ACT 1 puff  1 puff Inhalation Daily Agbata, Tochukwu, MD       Current Outpatient Medications  Medication Sig Dispense Refill   acetaminophen (TYLENOL) 500 MG tablet Take 1,000 mg by mouth every 6 (six) hours as needed for fever.     albuterol (VENTOLIN HFA) 108 (90 Base) MCG/ACT inhaler 2 puffs every 4 (four) hours as needed for shortness of breath or wheezing.     diltiazem (CARDIZEM CD) 300 MG 24 hr capsule Take 1 capsule (300 mg total) by mouth daily. 90 capsule 3   ELIQUIS 2.5 MG TABS tablet TAKE 1 TABLET(2.5 MG) BY MOUTH TWICE DAILY 60 tablet 0   fluocinonide (LIDEX) 0.05 % external solution Apply 1 application topically daily.     ibuprofen (ADVIL) 200 MG tablet Take 200 mg by mouth every 6 (six) hours as needed for moderate pain.     omeprazole (PRILOSEC) 20  MG capsule Take 1 capsule (20 mg total) by mouth daily. (Patient taking differently: Take 20 mg by mouth daily.) 30 capsule 6   umeclidinium-vilanterol (ANORO ELLIPTA) 62.5-25 MCG/INH AEPB Inhale 1 puff into the lungs daily.     clotrimazole-betamethasone (LOTRISONE) cream Apply 1 application topically 2 (two) times daily. (Patient not taking: Reported on 04/25/2021) 45 g 2     Review of Systems Full ROS  was asked and was negative  except for the information on the HPI  Physical Exam Blood pressure (!) 160/103, pulse (!) 110, temperature 98 F (36.7 C), resp. rate 18, height '6\' 1"'$  (1.854 m), weight 136 kg, SpO2 96 %. CONSTITUTIONAL: NAD. EYES: Pupils are equal, round,  Sclera are non-icteric. EARS, NOSE, MOUTH AND THROAT: He is wearing a mask, hearing is intact to voice. LYMPH NODES:  Lymph nodes in the neck are normal. RESPIRATORY:  Lungs are clear. There is normal respiratory effort, with equal breath sounds bilaterally, and without pathologic use of accessory muscles. CARDIOVASCULAR: Heart is regular without murmurs, gallops, or rubs. GI: The abdomen is  soft, distended with diffuse tenderness but without peritonitis, no rebound. There are no palpable masses. There is no hepatosplenomegaly. There are decreased bowel sounds  GU: Rectal deferred.   MUSCULOSKELETAL: Normal muscle strength and tone. No cyanosis or edema.   SKIN: Turgor is good and there are no pathologic skin lesions or ulcers. NEUROLOGIC: Motor and sensation is grossly normal. Cranial nerves are grossly intact. PSYCH:  Oriented to person, place and time. Affect is normal.  Data Reviewed  I have personally reviewed the patient's imaging, laboratory findings and medical records.    Assessment/Plan 76 year old male with ileus versus enteritis, there is no evidence of a definitive transition point.  We will place NG tube to suction and perform serial abdominal exams.  Replete appropriate electrolytes.  We will also repeat an  abdominal x-ray in the morning.  No need for emergent surgical intervention at this time.  We will be happy to follow him while he is in the hospital  Please note that I spent 75 minutes's encounter including personally reviewing imaging studies, extensive review of medical records, placing orders, coordinating his care and performing appropriate documentation.    Caroleen Hamman, MD FACS General Surgeon 04/25/2021, 5:23 PM

## 2021-04-25 NOTE — ED Notes (Signed)
ED TO INPATIENT HANDOFF REPORT  ED Nurse Name and Phone #: Darnelle Maffucci [phone]  S Name/Age/Gender Zachary Sweeney 76 y.o. male Room/Bed: ED43A/ED43A  Code Status   Code Status: Full Code  Home/SNF/Other Home Patient oriented to: self, place, time, and situation Is this baseline? Yes   Triage Complete: Triage complete  Chief Complaint Ileus [facility]) [K56.7]  Triage Note Pt comes into the ED via EMS from home with c/o constipation for the past 3 days, N/V today having loose stool on arrival, has taken some OTC meds for constipation..   126/95 163/115 [id number] OJ50 99%RA   Allergies No Known Allergies  Level of Care/Admitting Diagnosis ED Disposition     ED Disposition  Admit   Condition  --   Comment  Hospital Area: [place] [[id number]]  Level of Care: Telemetry Medical [104]  Covid Evaluation: Asymptomatic Screening Protocol (No Symptoms)  Diagnosis: Ileus [facility]) [[id number]]  Admitting Physician: Gary Fleet  Attending Physician: Gary Fleet  Estimated length of stay: 3 - 4 days  Certification:: I certify this patient will need inpatient services for at least 2 midnights          B Medical/Surgery History Past Medical History:  Diagnosis Date   Acid reflux    Back injury    Colon polyp    DVT (deep venous thrombosis) ([place])    a. 06/2017 LE U/S: Right popliteal vein DVT.   Dyspnea on exertion    a. 06/2017 CTA Chest: No PE. No significant coronary Ca2+, centrilobular and paraseptal emphysema;  b.  07/2017 Echo: EF 60-65%, no rwma, Gr1 DD, mildly dil LA. Nl RV fxn; c. 07/2017 MV: Small, severe defect involving apical inf and apical segments - only on rest images consistent w/ artifact. No ischemia or scar.   Emphysema of lung ([place])    a. 06/2017 CTA Chest: Centrilobular and paraseptal emphysema, with mild geographic ground-glass opacity likely representing small airway dzs.   Erythrocytosis 04/17/2019   Hypertension     Sleep apnea    Torn ligament    Past Surgical History:  Procedure Laterality Date   COLONOSCOPY  2008   COLONOSCOPY WITH PROPOFOL N/A 02/27/2017   Procedure: COLONOSCOPY WITH PROPOFOL;  Surgeon: Robert Bellow, MD;  Location: [facility] ENDOSCOPY;  Service: Endoscopy;  Laterality: N/A;   COLONOSCOPY WITH PROPOFOL N/A 07/24/2018   Procedure: COLONOSCOPY WITH PROPOFOL;  Surgeon: Virgel Manifold, MD;  Location: [facility] ENDOSCOPY;  Service: Endoscopy;  Laterality: N/A;   ESOPHAGOGASTRODUODENOSCOPY (EGD) WITH PROPOFOL N/A 02/27/2017   Procedure: ESOPHAGOGASTRODUODENOSCOPY (EGD) WITH PROPOFOL;  Surgeon: Robert Bellow, MD;  Location: [facility] ENDOSCOPY;  Service: Endoscopy;  Laterality: N/A;   ESOPHAGOGASTRODUODENOSCOPY (EGD) WITH PROPOFOL N/A 07/24/2018   Procedure: ESOPHAGOGASTRODUODENOSCOPY (EGD) WITH PROPOFOL;  Surgeon: Virgel Manifold, MD;  Location: [facility] ENDOSCOPY;  Service: Endoscopy;  Laterality: N/A;   HERNIA REPAIR Right    inguinal   INSERTION OF MESH  02/01/2021   Procedure: INSERTION OF MESH;  Surgeon: Herbert Pun, MD;  Location: [facility] ORS;  Service: General;;   KNEE SURGERY Left    NECK SURGERY     ROTATOR CUFF REPAIR Right    UMBILICAL HERNIA REPAIR N/A 04/22/2019   Procedure: HERNIA REPAIR UMBILICAL ADULT;  Surgeon: Ronny Bacon, MD;  Location: [facility] ORS;  Service: General;  Laterality: N/A;     A IV Location/Drains/Wounds Patient Lines/Drains/Airways Status     Active Line/Drains/Airways     Name Placement date Placement time Site Days  Peripheral IV 04/25/21 20 G 1" Right Antecubital 04/25/21  1340  Antecubital  less than 1   NG/OG Vented/Dual Lumen 16 Fr. Left nare External length of tube 70 cm 04/25/21  1557  Left nare  less than 1   External Urinary Catheter 04/25/21  1443  --  less than 1   Incision (Closed) 04/22/19 Abdomen 04/22/19  1537  -- 734   Incision - 3 Ports Abdomen Left;Upper;Lateral Left;Medial;Lateral Left;Lower;Lateral 02/01/21  0757  -- 83             Intake/Output Last 24 hours No intake or output data in the 24 hours ending 04/25/21 1604  Labs/Imaging Results for orders placed or performed during the hospital encounter of 04/25/21 (from the past 48 hour(s))  Lipase, blood     Status: None   Collection Time: 04/25/21 12:38 PM  Result Value Ref Range   Lipase 29 11 - 51 U/L    Comment: Performed at [facility], [address] [place], [place] [address]  Comprehensive metabolic panel     Status: Abnormal   Collection Time: 04/25/21 12:38 PM  Result Value Ref Range   Sodium 133 (L) 135 - 145 mmol/L   Potassium 3.6 3.5 - 5.1 mmol/L   Chloride 96 (L) 98 - 111 mmol/L   CO2 23 22 - 32 mmol/L   Glucose, Bld 142 (H) 70 - 99 mg/dL    Comment: Glucose reference range applies only to samples taken after fasting for at least 8 hours.   BUN 12 8 - 23 mg/dL   Creatinine, Ser 1.14 0.61 - 1.24 mg/dL   Calcium 10.2 8.9 - 10.3 mg/dL   Total Protein 8.4 (H) 6.5 - 8.1 g/dL   Albumin 4.5 3.5 - 5.0 g/dL   AST 34 15 - 41 U/L   ALT 25 0 - 44 U/L   Alkaline Phosphatase 53 38 - 126 U/L   Total Bilirubin 1.4 (H) 0.3 - 1.2 mg/dL   GFR, Estimated >60 >60 mL/min    Comment: (NOTE) Calculated using the CKD-EPI Creatinine Equation (2021)    Anion gap 14 5 - 15    Comment: Performed at [facility] [address] [place], [place] [address]  CBC     Status: Abnormal   Collection Time: 04/25/21 12:38 PM  Result Value Ref Range   WBC 12.5 (H) 4.0 - 10.5 K/uL   RBC 6.38 (H) 4.22 - 5.81 MIL/uL   Hemoglobin 18.9 (H) 13.0 - 17.0 g/dL   HCT 55.1 (H) 39.0 - 52.0 %   MCV 86.4 80.0 - 100.0 fL   MCH 29.6 26.0 - 34.0 pg   MCHC 34.3 30.0 - 36.0 g/dL   RDW 13.1 11.5 - 15.5 %   Platelets 179 150 - 400 K/uL   nRBC 0.0 0.0 - 0.2 %    Comment: Performed at [facility], [address] [place], [place] [address]  Urinalysis, Routine w reflex microscopic     Status: Abnormal   Collection Time: 04/25/21 12:38 PM   Result Value Ref Range   Color, Urine YELLOW (A) YELLOW   APPearance CLEAR (A) CLEAR   Specific Gravity, Urine 1.006 1.005 - 1.030   pH 6.0 5.0 - 8.0   Glucose, UA NEGATIVE NEGATIVE mg/dL   Hgb urine dipstick SMALL (A) NEGATIVE   Bilirubin Urine NEGATIVE NEGATIVE   Ketones, ur NEGATIVE NEGATIVE mg/dL   Protein, ur NEGATIVE NEGATIVE mg/dL   Nitrite NEGATIVE NEGATIVE   Leukocytes,Ua  NEGATIVE NEGATIVE   RBC / HPF 0-5 0 - 5 RBC/hpf   WBC, UA 0-5 0 - 5 WBC/hpf   Bacteria, UA NONE SEEN NONE SEEN   Squamous Epithelial / LPF NONE SEEN 0 - 5   Mucus PRESENT     Comment: Performed at [facility], [address] [place], [place] [address]  Resp Panel by RT-PCR (Flu A&B, Covid) Nasopharyngeal Swab     Status: None   Collection Time: 04/25/21  2:37 PM   Specimen: Nasopharyngeal Swab; Nasopharyngeal(NP) swabs in vial transport medium  Result Value Ref Range   SARS Coronavirus 2 by RT PCR NEGATIVE NEGATIVE    Comment: (NOTE) SARS-CoV-2 target nucleic acids are NOT DETECTED.  The SARS-CoV-2 RNA is generally detectable in upper respiratory specimens during the acute phase of infection. The lowest concentration of SARS-CoV-2 viral copies this assay can detect is 138 copies/mL. A negative result does not preclude SARS-Cov-2 infection and should not be used as the sole basis for treatment or other patient management decisions. A negative result may occur with  improper specimen collection/handling, submission of specimen other than nasopharyngeal swab, presence of viral mutation(s) within the areas targeted by this assay, and inadequate number of viral copies(<138 copies/mL). A negative result must be combined with clinical observations, patient history, and epidemiological information. The expected result is Negative.  Fact Sheet for Patients:  EntrepreneurPulse.com.au  Fact Sheet for Healthcare Providers:  IncredibleEmployment.be  This test  is no t yet approved or cleared by the [place] FDA and  has been authorized for detection and/or diagnosis of SARS-CoV-2 by FDA under an Emergency Use Authorization (EUA). This EUA will remain  in effect (meaning this test can be used) for the duration of the COVID-19 declaration under Section 564(b)(1) of the Act, 21 U.S.C.section 360bbb-3(b)(1), unless the authorization is terminated  or revoked sooner.       Influenza A by PCR NEGATIVE NEGATIVE   Influenza B by PCR NEGATIVE NEGATIVE    Comment: (NOTE) The Xpert Xpress SARS-CoV-2/FLU/RSV plus assay is intended as an aid in the diagnosis of influenza from Nasopharyngeal swab specimens and should not be used as a sole basis for treatment. Nasal washings and aspirates are unacceptable for Xpert Xpress SARS-CoV-2/FLU/RSV testing.  Fact Sheet for Patients: EntrepreneurPulse.com.au  Fact Sheet for Healthcare Providers: IncredibleEmployment.be  This test is not yet approved or cleared by the [place] FDA and has been authorized for detection and/or diagnosis of SARS-CoV-2 by FDA under an Emergency Use Authorization (EUA). This EUA will remain in effect (meaning this test can be used) for the duration of the COVID-19 declaration under Section 564(b)(1) of the Act, 21 U.S.C. section 360bbb-3(b)(1), unless the authorization is terminated or revoked.  Performed at [facility], [place],  [address]    DG Abdomen 1 View  Result Date: 04/25/2021 CLINICAL DATA:  Constipation for 3 days, nausea and vomiting today EXAM: ABDOMEN - 1 VIEW COMPARISON:  None FINDINGS: Gaseous distension of colon and some small bowel loops. No gas within rectum or distal sigmoid colon. Pattern could reflect ileus or distal colonic obstruction. No bowel wall thickening. Osseous structures demineralized. IMPRESSION: Gaseous distention of large and small bowel loops without definite gas in  rectum or distal sigmoid colon, question ileus versus distal colonic obstruction. Electronically Signed   By: Lavonia Dana M.D.   On: 04/25/2021 13:19   CT ABDOMEN PELVIS W CONTRAST  Result Date: 04/25/2021 CLINICAL DATA:  Abdominal pain, nausea  and vomiting, and constipation for several days. EXAM: CT ABDOMEN AND PELVIS WITH CONTRAST TECHNIQUE: Multidetector CT imaging of the abdomen and pelvis was performed using the standard protocol following bolus administration of intravenous contrast. RADIATION DOSE REDUCTION: This exam was performed according to the departmental dose-optimization program which includes automated exposure control, adjustment of the mA and/or kV according to patient size and/or use of iterative reconstruction technique. CONTRAST:  155m OMNIPAQUE IOHEXOL 300 MG/ML  SOLN COMPARISON:  01/18/2021 FINDINGS: Lower Chest: No acute findings. Hepatobiliary: No hepatic masses identified. Gallbladder is unremarkable. No evidence of biliary ductal dilatation. Pancreas:  No mass or inflammatory changes. Spleen: Within normal limits in size and appearance. Adrenals/Urinary Tract: No masses identified. Tiny cyst noted in the medial midpole of the right kidney. No evidence of ureteral calculi or hydronephrosis. Stomach/Bowel: Mild-to-moderate dilatation of small bowel loops is seen diffusely, without evidence of transition point. Mild colonic distention is also seen containing air-fluid levels throughout. This is most consistent with an adynamic ileus. No evidence of bowel wall thickening or focal inflammatory process. Vascular/Lymphatic: No pathologically enlarged lymph nodes. No acute vascular findings. Aortic atherosclerotic calcification noted. Reproductive:  No mass or other significant abnormality. Other:  None. Musculoskeletal:  No suspicious bone lesions identified. IMPRESSION: Diffuse small bowel dilatation and colonic distention, most consistent with adynamic ileus. No evidence of focal  inflammatory process, abscess, or mass. Aortic Atherosclerosis (ICD10-I70.0). Electronically Signed   By: JMarlaine HindM.D.   On: 04/25/2021 14:28    Pending Labs Unresulted Labs (From admission, onward)     Start     Ordered   04/26/21 00355 Basic metabolic panel  Tomorrow morning,   STAT        04/25/21 1505   04/26/21 0500  CBC  Tomorrow morning,   STAT        04/25/21 1505            Vitals/Pain Today's Vitals   04/25/21 1237 04/25/21 1246 04/25/21 1400  BP: (!) 167/99  (!) 144/95  Pulse: (!) 112  (!) 103  Resp: 18  20  Temp: 97.7 F (36.5 C)    TempSrc: Oral    SpO2: 96%  91%  Weight:  299 lb 13.2 oz (136 kg)   Height:  '6\' 1"'$  (1.854 m)     Isolation Precautions No active isolations  Medications Medications  enoxaparin (LOVENOX) injection 135 mg (has no administration in time range)  albuterol (PROVENTIL) (2.5 MG/3ML) 0.083% nebulizer solution 3 mL (has no administration in time range)  umeclidinium-vilanterol (ANORO ELLIPTA) 62.5-25 MCG/ACT 1 puff (has no administration in time range)  0.9 %  sodium chloride infusion (has no administration in time range)  acetaminophen (TYLENOL) tablet 650 mg (has no administration in time range)    Or  acetaminophen (TYLENOL) suppository 650 mg (has no administration in time range)  ondansetron (ZOFRAN) tablet 4 mg (has no administration in time range)    Or  ondansetron (ZOFRAN) injection 4 mg (has no administration in time range)  pantoprazole (PROTONIX) injection 40 mg (40 mg Intravenous Given 04/25/21 1601)  morphine (PF) 2 MG/ML injection 2 mg (2 mg Intravenous Given 04/25/21 1530)  phenol (CHLORASEPTIC) mouth spray 1 spray (has no administration in time range)  hydrALAZINE (APRESOLINE) injection 10 mg (has no administration in time range)  morphine (PF) 4 MG/ML injection 4 mg (4 mg Intravenous Given 04/25/21 1340)  ondansetron (ZOFRAN) injection 4 mg (4 mg Intravenous Given 04/25/21 1340)  iohexol (OMNIPAQUE) 300 MG/ML solution  100 mL (100 mLs Intravenous Contrast Given 04/25/21 1409)    Mobility walks Low fall risk , slow getting up per daughter     R Recommendations: See Admitting Provider Note  Report given to:   Additional Notes:

## 2021-04-25 NOTE — ED Triage Notes (Signed)
Pt comes into the ED via EMS from home with c/o constipation for the past 3 days, N/V today having loose stool on arrival, has taken some OTC meds for constipation..  ? ?126/95 ?163/115 ?[id number] ?IF53 ?99%RA ?

## 2021-04-25 NOTE — ED Notes (Signed)
Pt with green stomach contents in NGT. Pt reports some relief in nausea.  ?

## 2021-04-25 NOTE — Progress Notes (Addendum)
Patient admitted to Room 160 from ED due to 4 o 5 day history of Abdominal Pain, nausea, vomiting and diarrhea.  Patient hypertensive and will administer IV hydralazine per orders.  Patient complaining of 8/10 pain out of 0-10 pain scale. Will administer one time order of 2 mg Morphine per orders. NG Tube in place to low wall intermittent suction. Assessment completed. Bed to lowest position, call bell and needs within reach. Will continue to monitor, treat and assess per Plan of Care. ?

## 2021-04-25 NOTE — Assessment & Plan Note (Signed)
Patient has a BMI of 39.56 kg/m2 ?Complicates overall prognosis and care ?Lifestyle modification and exercise has been discussed with patient in detail ?

## 2021-04-25 NOTE — Assessment & Plan Note (Addendum)
Patient has a history of recurrent DVT and takes Eliquis.  Eliquis was held during admission in case of need for surgical intervention.  Covered with therapeutic dose Lovenox. --Resumed on Eliquis

## 2021-04-26 ENCOUNTER — Inpatient Hospital Stay: Payer: Medicare Other

## 2021-04-26 LAB — CBC
HCT: 54 % — ABNORMAL HIGH (ref 39.0–52.0)
Hemoglobin: 18.3 g/dL — ABNORMAL HIGH (ref 13.0–17.0)
MCH: 29.4 pg (ref 26.0–34.0)
MCHC: 33.9 g/dL (ref 30.0–36.0)
MCV: 86.8 fL (ref 80.0–100.0)
Platelets: 166 10*3/uL (ref 150–400)
RBC: 6.22 MIL/uL — ABNORMAL HIGH (ref 4.22–5.81)
RDW: 13.3 % (ref 11.5–15.5)
WBC: 11.8 10*3/uL — ABNORMAL HIGH (ref 4.0–10.5)
nRBC: 0 % (ref 0.0–0.2)

## 2021-04-26 LAB — BASIC METABOLIC PANEL
Anion gap: 10 (ref 5–15)
BUN: 11 mg/dL (ref 8–23)
CO2: 29 mmol/L (ref 22–32)
Calcium: 9.6 mg/dL (ref 8.9–10.3)
Chloride: 97 mmol/L — ABNORMAL LOW (ref 98–111)
Creatinine, Ser: 1.14 mg/dL (ref 0.61–1.24)
GFR, Estimated: >60 mL/min (ref >60)
Glucose, Bld: 123 mg/dL — ABNORMAL HIGH (ref 70–99)
Potassium: 4 mmol/L (ref 3.5–5.1)
Sodium: 136 mmol/L (ref 135–145)

## 2021-04-26 MED ORDER — ALBUMIN HUMAN 25 % IV SOLN
25.0000 g | Freq: Once | INTRAVENOUS | Status: AC
  Administered 2021-04-26: 25 g via INTRAVENOUS
  Filled 2021-04-26: qty 100

## 2021-04-26 MED ORDER — SODIUM CHLORIDE 0.9 % IV BOLUS
1000.0000 mL | Freq: Once | INTRAVENOUS | Status: AC
  Administered 2021-04-26: 1000 mL via INTRAVENOUS

## 2021-04-26 MED ORDER — FLUTICASONE PROPIONATE 50 MCG/ACT NA SUSP
2.0000 | Freq: Every day | NASAL | Status: DC
  Administered 2021-04-26 – 2021-05-02 (×6): 2 via NASAL
  Filled 2021-04-26: qty 16

## 2021-04-26 MED ORDER — SALINE SPRAY 0.65 % NA SOLN
1.0000 | NASAL | Status: DC | PRN
  Filled 2021-04-26 (×2): qty 44

## 2021-04-26 NOTE — Hospital Course (Signed)
Zachary Sweeney is a 76 y.o. male with medical history significant for GERD, hernia repair, DVT, COPD, sleep apnea, hypertension who presents to the ER for evaluation of a 5-day history of constipation, diffuse abdominal pain rated 10 x 10 in intensity at its worst, nausea and vomiting. ? ?Patient was found to have ileus.  Admitted to hospitalist service with GI and general surgery consulted.  NG tube was placed to wall suction. ? ? ?

## 2021-04-26 NOTE — Progress Notes (Signed)
CC: ileus ?Subjective: ?Doing better, still some inter pain but better. + bms ?Kub pers. Reviewed c/w ileus, air in colon, no free air  ?Objective: ?Vital signs in last 24 hours: ?Temp:  [97.7 ?F (36.5 ?C)-98.7 ?F (37.1 ?C)] 97.8 ?F (36.6 ?C) (03/08 0759) ?Pulse Rate:  [100-119] 100 (03/08 0759) ?Resp:  [13-22] 13 (03/08 0759) ?BP: (124-179)/(71-107) 145/98 (03/08 0759) ?SpO2:  [91 %-98 %] 97 % (03/08 0759) ?Weight:  [960 kg] 136 kg (03/07 1246) ?Last BM Date : 04/25/21 ? ?Intake/Output from previous day: ?03/07 0701 - 03/08 0700 ?In: 0  ?Out: 4540 [Urine:1550; Emesis/NG output:2000] ?Intake/Output this shift: ?No intake/output data recorded. ? ?Physical exam: ?NAD alert ?Abd: soft, mind diffuse tenderness w/o peritonitis. No infection ?Ext: well perfused, warm ? ? ?Lab Results: ?CBC  ?Recent Labs  ?  04/25/21 ?1238 04/26/21 ?0335  ?WBC 12.5* 11.8*  ?HGB 18.9* 18.3*  ?HCT 55.1* 54.0*  ?PLT 179 166  ? ?BMET ?Recent Labs  ?  04/25/21 ?1238 04/26/21 ?0335  ?NA 133* 136  ?K 3.6 4.0  ?CL 96* 97*  ?CO2 23 29  ?GLUCOSE 142* 123*  ?BUN 12 11  ?CREATININE 1.14 1.14  ?CALCIUM 10.2 9.6  ? ?PT/INR ?No results for input(s): LABPROT, INR in the last 72 hours. ?ABG ?No results for input(s): PHART, HCO3 in the last 72 hours. ? ?Invalid input(s): PCO2, PO2 ? ?Studies/Results: ?DG Abdomen 1 View ? ?Result Date: 04/25/2021 ?CLINICAL DATA:  Nasogastric tube placement. EXAM: ABDOMEN - 1 VIEW COMPARISON:  Same day. FINDINGS: Nasogastric tube tip is seen in expected position of the proximal stomach. IMPRESSION: Nasogastric tube tip seen in expected position of proximal stomach. Electronically Signed   By: Marijo Conception M.D.   On: 04/25/2021 16:25  ? ?DG Abdomen 1 View ? ?Result Date: 04/25/2021 ?CLINICAL DATA:  Constipation for 3 days, nausea and vomiting today EXAM: ABDOMEN - 1 VIEW COMPARISON:  None FINDINGS: Gaseous distension of colon and some small bowel loops. No gas within rectum or distal sigmoid colon. Pattern could reflect ileus  or distal colonic obstruction. No bowel wall thickening. Osseous structures demineralized. IMPRESSION: Gaseous distention of large and small bowel loops without definite gas in rectum or distal sigmoid colon, question ileus versus distal colonic obstruction. Electronically Signed   By: Lavonia Dana M.D.   On: 04/25/2021 13:19  ? ?CT ABDOMEN PELVIS W CONTRAST ? ?Result Date: 04/25/2021 ?CLINICAL DATA:  Abdominal pain, nausea and vomiting, and constipation for several days. EXAM: CT ABDOMEN AND PELVIS WITH CONTRAST TECHNIQUE: Multidetector CT imaging of the abdomen and pelvis was performed using the standard protocol following bolus administration of intravenous contrast. RADIATION DOSE REDUCTION: This exam was performed according to the departmental dose-optimization program which includes automated exposure control, adjustment of the mA and/or kV according to patient size and/or use of iterative reconstruction technique. CONTRAST:  190m OMNIPAQUE IOHEXOL 300 MG/ML  SOLN COMPARISON:  01/18/2021 FINDINGS: Lower Chest: No acute findings. Hepatobiliary: No hepatic masses identified. Gallbladder is unremarkable. No evidence of biliary ductal dilatation. Pancreas:  No mass or inflammatory changes. Spleen: Within normal limits in size and appearance. Adrenals/Urinary Tract: No masses identified. Tiny cyst noted in the medial midpole of the right kidney. No evidence of ureteral calculi or hydronephrosis. Stomach/Bowel: Mild-to-moderate dilatation of small bowel loops is seen diffusely, without evidence of transition point. Mild colonic distention is also seen containing air-fluid levels throughout. This is most consistent with an adynamic ileus. No evidence of bowel wall thickening or focal inflammatory process.  Vascular/Lymphatic: No pathologically enlarged lymph nodes. No acute vascular findings. Aortic atherosclerotic calcification noted. Reproductive:  No mass or other significant abnormality. Other:  None.  Musculoskeletal:  No suspicious bone lesions identified. IMPRESSION: Diffuse small bowel dilatation and colonic distention, most consistent with adynamic ileus. No evidence of focal inflammatory process, abscess, or mass. Aortic Atherosclerosis (ICD10-I70.0). Electronically Signed   By: Marlaine Hind M.D.   On: 04/25/2021 14:28  ? ?DG ABD ACUTE 2+V W 1V CHEST ? ?Result Date: 04/26/2021 ?CLINICAL DATA:  Follow-up ileus versus small-bowel obstruction. EXAM: DG ABDOMEN ACUTE WITH 1 VIEW CHEST COMPARISON:  CT abdomen and pelvis with contrast yesterday at 2:09 p.m., PA Lat chest 03/30/2018. FINDINGS: PA chest: There is perihilar linear atelectasis. The lungs are clear of focal infiltrates. No pleural effusion is seen. There is mild aortic tortuosity and atherosclerosis with stable mediastinum. NGT is well inside the stomach with tip in the antrum. Unremarkable thoracic cage. Abdomen: There is mild gas distention of the colon. There are central abdominal aerated small bowel segments dilated up to 4.1 cm. There has been no noteworthy interval change in the small-bowel distention. Colonic gas is visible at least as far as the distal descending colon. No pathologic calcifications are noted aside from aortoiliac calcification. There is no supine evidence of free air. There are stable visceral shadows. Exam technically limited due to large body habitus. IMPRESSION: No interval worsening in gas distention of the colon and dilatation of the central abdominal small bowel. This could be due to adynamic ileus or distal small bowel obstruction. Ileus is currently favored. NGT is adequately placed. Electronically Signed   By: Telford Nab M.D.   On: 04/26/2021 07:23   ? ?Anti-infectives: ?Anti-infectives (From admission, onward)  ? ? None  ? ?  ? ? ?Assessment/Plan: ? ?Ileus some improvement ?Keep ngt ?Kub in am ?No need surgical intervention at this time ? ?Caroleen Hamman, MD, FACS ? ?04/26/2021 ? ? ? ?  ?

## 2021-04-26 NOTE — Progress Notes (Signed)
?Progress Note ? ? ?Patient: Zachary Sweeney DOB: 01-30-1946 DOA: 04/25/2021     1 ?DOS: the patient was seen and examined on 04/26/2021 ?  ?Brief hospital course: ? Zachary Sweeney is a 76 y.o. male with medical history significant for GERD, hernia repair, DVT, COPD, sleep apnea, hypertension who presents to the ER for evaluation of a 5-day history of constipation, diffuse abdominal pain rated 10 x 10 in intensity at its worst, nausea and vomiting. ? ?Patient was found to have ileus.  Admitted to hospitalist service with GI and general surgery consulted.  NG tube was placed to wall suction. ? ? ? ?Assessment and Plan: ?* Ileus ([place]) ?Patient presents to the ER for evaluation of diffuse abdominal pain associated with constipation, nausea and vomiting. ?He has a history of recent hernia repair ?CT scan of abdomen and pelvis shows diffuse small bowel dilatation and colonic distention, most consistent with adynamic ileus. ?-- Continue NG tube for decompression ?--Pain control with IV morphine ?--Antiemetics as needed ?--IV PPI  ?--IV fluid hydration ?--General surgery following, appreciate recommendations ? ?Emphysema of lung ([place]) ?Stable and not acutely exacerbated ?Continue as needed bronchodilator therapy and inhaled steroids ? ?Acid reflux ?Place patient on IV PPI ? ?Deep vein thrombosis (DVT) of popliteal vein of right lower extremity (HCC) ?Patient has a history of recurrent DVT and takes Eliquis. ?-- Hold Eliquis ?--Full dose Lovenox for now in case of need for surgical intervention ? ? ?Obesity, unspecified ?Patient has a BMI of 39.56 kg/m2 ?Complicates overall prognosis and care ?Lifestyle modification and exercise has been discussed with patient in detail ? ?Essential hypertension ?BP uncontrolled due to pain. ?As needed IV hydralazine. ?Hold oral Cardizem for now while NPO. ? ? ? ? ?  ? ?Subjective: Patient seen awake resting in bed with NG tube in place this morning.  He was moaning and  groaning in pain.  Denies having passed any gas or stool overnight or this morning.  No other acute complaints at this time. ? ?Physical Exam: ?Vitals:  ? 04/26/21 0300 04/26/21 0535 04/26/21 0759 04/26/21 1148  ?BP: (!) 153/95 124/71 (!) 145/98 (!) 164/100  ?Pulse: (!) 104 (!) 103 100 99  ?Resp: '19 20 13 18  '$ ?Temp: 98.3 ?F (36.8 ?C) 98.7 ?F (37.1 ?C) 97.8 ?F (36.6 ?C) 98.5 ?F (36.9 ?C)  ?TempSrc:      ?SpO2:  94% 97% 99%  ?Weight:      ?Height:      ? ?General exam: awake, alert, no mild distress due to pain ?HEENT: NG tube in place connected to wall suction mostly keeps eyes closed, moist mucus membranes, hearing grossly normal  ?Respiratory system: CTAB with diminished bases due to poor inspiratory effort, no wheezes, rales or rhonchi, normal respiratory effort. ?Cardiovascular system: normal S1/S2, RRR,  no pedal edema.   ?Gastrointestinal system: Distended, mildly tender diffusely, unable to appreciate any active bowel sounds ?Central nervous system: A&O x3. no gross focal neurologic deficits, normal speech ?Extremities: moves all, no edema, normal tone ?Skin: dry, intact, normal temperature ?Psychiatry: normal mood, congruent affect, judgement and insight appear normal ? ? ?Data Reviewed: ? ?Labs reviewed and notable for chloride 97, glucose 123.  White count 11.8, hemoglobin 18.3. ? ? ?Abdominal x-ray this morning impression: ?No interval worsening in gas distention of the colon and dilatation ?of the central abdominal small bowel. This could be due to adynamic ?ileus or distal small bowel obstruction. Ileus is currently favored. ?NGT is adequately placed. ? ? ?  Family Communication: None at bedside, will attempt to call ? ?Disposition: ?Status is: Inpatient ?Remains inpatient appropriate because: Persistent ileus with NG tube in place on IV fluids ? ? ? Planned Discharge Destination: Home ? ? ? ?Time spent: 35 minutes ? ?Author: ?Ezekiel Slocumb, DO ?04/26/2021 3:59 PM ? ?For on call review [email or web address].  ?

## 2021-04-26 NOTE — TOC Initial Note (Signed)
Transition of Care (TOC) - Initial/Assessment Note  ? ? ?Patient Details  ?Name: Zachary Sweeney ?MRN: [id number] ?Date of Birth: 1945-04-16 ? ?Transition of Care (TOC) CM/SW Contact:    ?Conception Oms, RN ?Phone Number: ?04/26/2021, 1:05 PM ? ?Clinical Narrative:       ?Transition of Care (TOC) Screening Note ? ? ?Patient Details  ?Name: Zachary Sweeney ?Date of Birth: 22-Nov-1945 ? ? ?Transition of Care (TOC) CM/SW Contact:    ?Conception Oms, RN ?Phone Number: ?04/26/2021, 1:05 PM ? ? ? ?Transition of Care Department [facility]) has reviewed patient and no TOC needs have been identified at this time. We will continue to monitor patient advancement through interdisciplinary progression rounds. If new patient transition needs arise, please place a TOC consult. ?            ? ? ?  ?  ? ? ?Patient Goals and CMS Choice ?  ?  ?  ? ?Expected Discharge Plan and Services ?  ?  ?  ?  ?  ?                ?  ?  ?  ?  ?  ?  ?  ?  ?  ?  ? ?Prior Living Arrangements/Services ?  ?  ?  ?       ?  ?  ?  ?  ? ?Activities of Daily Living ?  ?  ? ?Permission Sought/Granted ?  ?  ?   ?   ?   ?   ? ?Emotional Assessment ?  ?  ?  ?  ?  ?  ? ?Admission diagnosis:  Ileus ([place]) [K56.7] ?Patient Active Problem List  ? Diagnosis Date Noted  ? Ileus ([place]) 04/25/2021  ? Acid reflux   ? Emphysema of lung ([place])   ? S/P umbilical hernia repair, follow-up exam 04/30/2019  ? Erythrocytosis 04/17/2019  ? Positive fecal occult blood test   ? Polyp of colon   ? Diverticulosis of large intestine without diverticulitis   ? CLE (columnar lined esophagus)   ? DOE (dyspnea on exertion) 07/19/2017  ? Atypical chest pain 07/19/2017  ? Deep vein thrombosis (DVT) of popliteal vein of right lower extremity ([place]) 07/19/2017  ? Benign prostatic hyperplasia 06/25/2017  ? Essential hypertension 06/25/2017  ? Esophageal dysphagia 01/31/2017  ? Epigastric pain 01/31/2017  ? Obesity, unspecified 06/26/2013  ? ?PCP:  Theotis Burrow, MD ?Pharmacy:   ?[place], [place] ?[place] [address] ?Phone: [phone] Fax: [phone] ? ?[facility] [place], [place] [facility] ?[address] ?[place] [address] ?Phone: [phone] Fax: [phone] ? ? ? ? ?Social Determinants of Health (SDOH) Interventions ?  ? ?Readmission Risk Interventions ?No flowsheet data found. ? ? ?

## 2021-04-26 NOTE — Plan of Care (Signed)

## 2021-04-27 ENCOUNTER — Inpatient Hospital Stay: Payer: Medicare Other

## 2021-04-27 LAB — CBC
HCT: 52.5 % — ABNORMAL HIGH (ref 39.0–52.0)
Hemoglobin: 17.4 g/dL — ABNORMAL HIGH (ref 13.0–17.0)
MCH: 29.6 pg (ref 26.0–34.0)
MCHC: 33.1 g/dL (ref 30.0–36.0)
MCV: 89.4 fL (ref 80.0–100.0)
Platelets: 152 10*3/uL (ref 150–400)
RBC: 5.87 MIL/uL — ABNORMAL HIGH (ref 4.22–5.81)
RDW: 13.3 % (ref 11.5–15.5)
WBC: 10.3 10*3/uL (ref 4.0–10.5)
nRBC: 0 % (ref 0.0–0.2)

## 2021-04-27 LAB — MAGNESIUM: Magnesium: 2.6 mg/dL — ABNORMAL HIGH (ref 1.7–2.4)

## 2021-04-27 LAB — BASIC METABOLIC PANEL
Anion gap: 7 (ref 5–15)
BUN: 12 mg/dL (ref 8–23)
CO2: 27 mmol/L (ref 22–32)
Calcium: 9.1 mg/dL (ref 8.9–10.3)
Chloride: 102 mmol/L (ref 98–111)
Creatinine, Ser: 1.13 mg/dL (ref 0.61–1.24)
GFR, Estimated: >60 mL/min (ref >60)
Glucose, Bld: 105 mg/dL — ABNORMAL HIGH (ref 70–99)
Potassium: 4.2 mmol/L (ref 3.5–5.1)
Sodium: 136 mmol/L (ref 135–145)

## 2021-04-27 MED ORDER — ALBUMIN HUMAN 25 % IV SOLN
25.0000 g | Freq: Once | INTRAVENOUS | Status: AC
Start: 2021-04-27 — End: 2021-04-27
  Administered 2021-04-27: 13:00:00 25 g via INTRAVENOUS
  Filled 2021-04-27: qty 100

## 2021-04-27 NOTE — Progress Notes (Signed)
?Progress Note ? ? ?Patient: Zachary Sweeney QIW:[id number] DOB: 11-15-1945 DOA: 04/25/2021     2 ?DOS: the patient was seen and examined on 04/27/2021 ?  ?Brief hospital course: ? Zachary Sweeney is a 76 y.o. male with medical history significant for GERD, hernia repair, DVT, COPD, sleep apnea, hypertension who presents to the ER for evaluation of a 5-day history of constipation, diffuse abdominal pain rated 10 x 10 in intensity at its worst, nausea and vomiting. ? ?Patient was found to have ileus.  Admitted to hospitalist service with GI and general surgery consulted.  NG tube was placed to wall suction. ? ? ? ?Assessment and Plan: ?* Ileus ([place]) ?Patient presents to the ER for evaluation of diffuse abdominal pain associated with constipation, nausea and vomiting. ?He has a history of recent hernia repair ?CT scan of abdomen and pelvis shows diffuse small bowel dilatation and colonic distention, most consistent with adynamic ileus. ?3/9: KUB Overall similar gaseous distention of the bowel most suggestive ?of ileus. ?-- Continue NG tube for decompression ?--NPO, ice chips or sip & spit water okay ?--Pain control with IV morphine ?--Antiemetics as needed ?--IV PPI  ?--IV fluid hydration ?--General surgery following, appreciate recommendations ? ?Emphysema of lung ([place]) ?Stable and not acutely exacerbated ?Continue as needed bronchodilator therapy and inhaled steroids ? ?Acid reflux ?IV PPI until taking PO ? ?Deep vein thrombosis (DVT) of popliteal vein of right lower extremity (HCC) ?Patient has a history of recurrent DVT and takes Eliquis. ?-- Hold Eliquis ?--Full dose Lovenox for now in case of need for surgical intervention ? ? ?Obesity, unspecified ?Patient has a BMI of 39.56 kg/m2 ?Complicates overall prognosis and care ?Lifestyle modification and exercise has been discussed with patient in detail ? ?Essential hypertension ?BP uncontrolled due to pain, PO meds held. ?As needed IV hydralazine. ?Hold oral Cardizem  for now while NPO. ? ? ? ? ?  ? ?Subjective: Patient awake resting in bed seen with daughter at bedside this morning.  He is feeling a little bit better today.  Reports passing gas and liquid stool.  Complains of having very dry mouth and sinus headache.  No nausea or vomiting.  Abdominal pain improved and abdomen is slightly less distended today. ? ?Physical Exam: ?Vitals:  ? 04/27/21 0431 04/27/21 0808 04/27/21 0809 04/27/21 1127  ?BP: (!) 146/84 (!) 141/88  (!) 151/96  ?Pulse: (!) 110 (!) 111 (!) 108 (!) 103  ?Resp: '16 18  16  '$ ?Temp: 98 ?F (36.7 ?C) 98.2 ?F (36.8 ?C)  98.5 ?F (36.9 ?C)  ?TempSrc:      ?SpO2: 98% 98% 98% 95%  ?Weight:      ?Height:      ? ?General exam: awake, alert, no acute distress, obese ?HEENT: NG tube in place to wall suction, moist mucus membranes, hearing grossly normal  ?Respiratory system: CTAB, no wheezes, rales or rhonchi, normal respiratory effort. ?Cardiovascular system: normal S1/S2, RRR, no pedal edema.   ?Gastrointestinal system: Abdominal distention is less tense today, nontender, unable to appreciate bowel sounds. ?Central nervous system: A&O x3. no gross focal neurologic deficits, normal speech ?Extremities: moves all, no edema, normal tone ?Skin: dry, intact, normal temperature, normal color, No rashes, lesions or ulcers seen on visualized skin ?Psychiatry: normal mood, congruent affect, judgement and insight appear normal ? ? ? ?Data Reviewed: ? ?Labs reviewed and notable for glucose 105, magnesium elevated 2.6, white count normalized 10.3, hemoglobin 17.4 down from 18.3. ? ?KUB this morning impression: ?1. Enteric catheter  tip and sidehole project over the stomach. ?2. Overall similar gaseous distention of the bowel most suggestive ?of ileus. ? ? ? ?Family Communication: Daughter at bedside on rounds ? ?Disposition: ?Status is: Inpatient ?Remains inpatient appropriate because: Severity of illness with persistent ileus, n.p.o. with NG tube in place and on IV fluids ? ? ?  Planned Discharge Destination: Home ? ? ? ?Time spent: 35 minutes ? ?Author: ?Ezekiel Slocumb, DO ?04/27/2021 12:40 PM ? ?For on call review [email or web address].  ?

## 2021-04-27 NOTE — Progress Notes (Signed)
Zachary Sweeney , MD [address] [place], [place], [place], [address] [address] [place], [place], [place], [address] Phone: [phone]  Fax: [phone]   Zachary Sweeney is being followed for ileus day 2 of follow up   Subjective: Feels better passing some gas, liquid stool.  Denies any abdominal pain   Objective: Vital signs in last 24 hours: Vitals:   04/27/21 0015 04/27/21 0431 04/27/21 0808 04/27/21 0809  BP: 132/80 (!) 146/84 (!) 141/88   Pulse: (!) 104 (!) 110 (!) 111 (!) 108  Resp: '20 16 18   '$ Temp: 98.4 F (36.9 C) 98 F (36.7 C) 98.2 F (36.8 C)   TempSrc:      SpO2: 100% 98% 98% 98%  Weight:      Height:       Weight change:   Intake/Output Summary (Last 24 hours) at 04/27/2021 0904 Last data filed at 04/27/2021 3007 Gross per 24 hour  Intake 2128.09 ml  Output 2775 ml  Net -646.91 ml     Exam: Heart:: Regular rate and rhythm Lungs: normal Abdomen: Distended, soft, no tenderness no guarding no rigidity bowel sounds few noted.   Lab Results: '@LABTEST2'$ @ Micro Results: Recent Results (from the past 240 hour(s))  Resp Panel by RT-PCR (Flu A&B, Covid) Nasopharyngeal Swab     Status: None   Collection Time: 04/25/21  2:37 PM   Specimen: Nasopharyngeal Swab; Nasopharyngeal(NP) swabs in vial transport medium  Result Value Ref Range Status   SARS Coronavirus 2 by RT PCR NEGATIVE NEGATIVE Final    Comment: (NOTE) SARS-CoV-2 target nucleic acids are NOT DETECTED.  The SARS-CoV-2 RNA is generally detectable in upper respiratory specimens during the acute phase of infection. The lowest concentration of SARS-CoV-2 viral copies this assay can detect is 138 copies/mL. A negative result does not preclude SARS-Cov-2 infection and should not be used as the sole basis for treatment or other patient management decisions. A negative result may occur with  improper specimen collection/handling, submission of specimen other than nasopharyngeal swab, presence  of viral mutation(s) within the areas targeted by this assay, and inadequate number of viral copies(<138 copies/mL). A negative result must be combined with clinical observations, patient history, and epidemiological information. The expected result is Negative.  Fact Sheet for Patients:  EntrepreneurPulse.com.au  Fact Sheet for Healthcare Providers:  IncredibleEmployment.be  This test is no t yet approved or cleared by the [place] FDA and  has been authorized for detection and/or diagnosis of SARS-CoV-2 by FDA under an Emergency Use Authorization (EUA). This EUA will remain  in effect (meaning this test can be used) for the duration of the COVID-19 declaration under Section 564(b)(1) of the Act, 21 U.S.C.section 360bbb-3(b)(1), unless the authorization is terminated  or revoked sooner.       Influenza A by PCR NEGATIVE NEGATIVE Final   Influenza B by PCR NEGATIVE NEGATIVE Final    Comment: (NOTE) The Xpert Xpress SARS-CoV-2/FLU/RSV plus assay is intended as an aid in the diagnosis of influenza from Nasopharyngeal swab specimens and should not be used as a sole basis for treatment. Nasal washings and aspirates are unacceptable for Xpert Xpress SARS-CoV-2/FLU/RSV testing.  Fact Sheet for Patients: EntrepreneurPulse.com.au  Fact Sheet for Healthcare Providers: IncredibleEmployment.be  This test is not yet approved or cleared by the [place] FDA and has been authorized for detection and/or diagnosis of SARS-CoV-2 by FDA under an Emergency Use Authorization (EUA). This EUA will remain in effect (meaning this test can  be used) for the duration of the COVID-19 declaration under Section 564(b)(1) of the Act, 21 U.S.C. section 360bbb-3(b)(1), unless the authorization is terminated or revoked.  Performed at [facility], [address] [place], [place] [address]    Studies/Results: [place]  Abdomen 1 View  Result Date: 04/25/2021 CLINICAL DATA:  Nasogastric tube placement. EXAM: ABDOMEN - 1 VIEW COMPARISON:  Same day. FINDINGS: Nasogastric tube tip is seen in expected position of the proximal stomach. IMPRESSION: Nasogastric tube tip seen in expected position of proximal stomach. Electronically Signed   By: Marijo Conception M.D.   On: 04/25/2021 16:25   DG Abdomen 1 View  Result Date: 04/25/2021 CLINICAL DATA:  Constipation for 3 days, nausea and vomiting today EXAM: ABDOMEN - 1 VIEW COMPARISON:  None FINDINGS: Gaseous distension of colon and some small bowel loops. No gas within rectum or distal sigmoid colon. Pattern could reflect ileus or distal colonic obstruction. No bowel wall thickening. Osseous structures demineralized. IMPRESSION: Gaseous distention of large and small bowel loops without definite gas in rectum or distal sigmoid colon, question ileus versus distal colonic obstruction. Electronically Signed   By: Lavonia Dana M.D.   On: 04/25/2021 13:19   CT ABDOMEN PELVIS W CONTRAST  Result Date: 04/25/2021 CLINICAL DATA:  Abdominal pain, nausea and vomiting, and constipation for several days. EXAM: CT ABDOMEN AND PELVIS WITH CONTRAST TECHNIQUE: Multidetector CT imaging of the abdomen and pelvis was performed using the standard protocol following bolus administration of intravenous contrast. RADIATION DOSE REDUCTION: This exam was performed according to the departmental dose-optimization program which includes automated exposure control, adjustment of the mA and/or kV according to patient size and/or use of iterative reconstruction technique. CONTRAST:  187m OMNIPAQUE IOHEXOL 300 MG/ML  SOLN COMPARISON:  01/18/2021 FINDINGS: Lower Chest: No acute findings. Hepatobiliary: No hepatic masses identified. Gallbladder is unremarkable. No evidence of biliary ductal dilatation. Pancreas:  No mass or inflammatory changes. Spleen: Within normal limits in size and appearance. Adrenals/Urinary Tract:  No masses identified. Tiny cyst noted in the medial midpole of the right kidney. No evidence of ureteral calculi or hydronephrosis. Stomach/Bowel: Mild-to-moderate dilatation of small bowel loops is seen diffusely, without evidence of transition point. Mild colonic distention is also seen containing air-fluid levels throughout. This is most consistent with an adynamic ileus. No evidence of bowel wall thickening or focal inflammatory process. Vascular/Lymphatic: No pathologically enlarged lymph nodes. No acute vascular findings. Aortic atherosclerotic calcification noted. Reproductive:  No mass or other significant abnormality. Other:  None. Musculoskeletal:  No suspicious bone lesions identified. IMPRESSION: Diffuse small bowel dilatation and colonic distention, most consistent with adynamic ileus. No evidence of focal inflammatory process, abscess, or mass. Aortic Atherosclerosis (ICD10-I70.0). Electronically Signed   By: JMarlaine HindM.D.   On: 04/25/2021 14:28   DG ABD ACUTE 2+V W 1V CHEST  Result Date: 04/27/2021 CLINICAL DATA:  Ileus, abdominal pain, emesis EXAM: DG ABDOMEN ACUTE WITH 1 VIEW CHEST COMPARISON:  KUB dated 1 day prior FINDINGS: The enteric catheter tip and sidehole project over the stomach. There is mild gaseous distention of the bowel, overall similar to the prior study. There is no definite free intraperitoneal air. There is no abnormal soft tissue calcification. Heart is mildly enlarged. The upper mediastinal contours are within normal limits. There is no focal consolidation or pulmonary edema. There is no pleural effusion or pneumothorax. IMPRESSION: 1. Enteric catheter tip and sidehole project over the stomach. 2. Overall similar gaseous distention of the bowel most suggestive of  ileus. Electronically Signed   By: Valetta Mole M.D.   On: 04/27/2021 08:22   DG ABD ACUTE 2+V W 1V CHEST  Result Date: 04/26/2021 CLINICAL DATA:  Follow-up ileus versus small-bowel obstruction. EXAM: DG  ABDOMEN ACUTE WITH 1 VIEW CHEST COMPARISON:  CT abdomen and pelvis with contrast yesterday at 2:09 p.m., PA Lat chest 03/30/2018. FINDINGS: PA chest: There is perihilar linear atelectasis. The lungs are clear of focal infiltrates. No pleural effusion is seen. There is mild aortic tortuosity and atherosclerosis with stable mediastinum. NGT is well inside the stomach with tip in the antrum. Unremarkable thoracic cage. Abdomen: There is mild gas distention of the colon. There are central abdominal aerated small bowel segments dilated up to 4.1 cm. There has been no noteworthy interval change in the small-bowel distention. Colonic gas is visible at least as far as the distal descending colon. No pathologic calcifications are noted aside from aortoiliac calcification. There is no supine evidence of free air. There are stable visceral shadows. Exam technically limited due to large body habitus. IMPRESSION: No interval worsening in gas distention of the colon and dilatation of the central abdominal small bowel. This could be due to adynamic ileus or distal small bowel obstruction. Ileus is currently favored. NGT is adequately placed. Electronically Signed   By: Telford Nab M.D.   On: 04/26/2021 07:23   Medications: I have reviewed the patient's current medications. Scheduled Meds:  enoxaparin (LOVENOX) injection  1 mg/kg Subcutaneous Q12H   fluticasone  2 spray Each Nare Daily   pantoprazole (PROTONIX) IV  40 mg Intravenous Daily   umeclidinium-vilanterol  1 puff Inhalation Daily   Continuous Infusions:  sodium chloride 100 mL/hr at 04/27/21 0022   PRN Meds:.acetaminophen **OR** acetaminophen, albuterol, hydrALAZINE, morphine injection, ondansetron **OR** ondansetron (ZOFRAN) IV, phenol, sodium chloride   Assessment: Principal Problem:   Ileus (HCC) Active Problems:   Essential hypertension   Obesity, unspecified   Deep vein thrombosis (DVT) of popliteal vein of right lower extremity (HCC)   Acid  reflux   Emphysema of lung ([place])  He is being followed for ileus.  CT scan of the abdomen showed features of small bowel as well as colonic ileus no obstruction.  On conservative treatment with NG tube and suction.  Passing some gas and having liquid bowel movements.  He feels better today than he was yesterday.  Plan: Continue conservative management monitor electrolytes maintain potassium over 4 and magnesium over 2.  Avoid narcotics.  IV hydration.  If does not resolve conservatively then may need Gastrografin enema +/- Gastrografin small bowel follow-through.  Surgery is following.  No further interventions required from gastroenterology I will sign off.  Please call me if any further GI concerns or questions.  We would like to thank you for the opportunity to participate in the care of [name].    LOS: 2 days   Zachary Bellows, MD 04/27/2021, 9:04 AM

## 2021-04-27 NOTE — Progress Notes (Signed)
CC: ileus ?Subjective: ?Feeling better, + flatus and BMs ?NGT 1200cc ?Some mild abd pain ?KUB pers. Reviewed, gas in the colon. ?WBc normalized ? ?Objective: ?Vital signs in last 24 hours: ?Temp:  [98 ?F (36.7 ?C)-99 ?F (37.2 ?C)] 98.5 ?F (36.9 ?C) (03/09 1127) ?Pulse Rate:  [98-111] 103 (03/09 1127) ?Resp:  [16-20] 16 (03/09 1127) ?BP: (132-163)/(80-109) 151/96 (03/09 1127) ?SpO2:  [95 %-100 %] 95 % (03/09 1127) ?Last BM Date : 04/27/21 ? ?Intake/Output from previous day: ?03/08 0701 - 03/09 0700 ?In: 2128.1 [I.V.:1593.3; NG/GT:60; IV Piggyback:474.8] ?Out: 2775 [AXENM:0768; Emesis/NG output:1200] ?Intake/Output this shift: ?No intake/output data recorded. ? ?Physical exam: ? ?NAD, alert ?Abd: soft, mild tenderness w/o peritonitis ?Ext: well perfused, warm ? ?Lab Results: ?CBC  ?Recent Labs  ?  04/26/21 ?0881 04/27/21 ?0413  ?WBC 11.8* 10.3  ?HGB 18.3* 17.4*  ?HCT 54.0* 52.5*  ?PLT 166 152  ? ?BMET ?Recent Labs  ?  04/26/21 ?1031 04/27/21 ?0413  ?NA 136 136  ?K 4.0 4.2  ?CL 97* 102  ?CO2 29 27  ?GLUCOSE 123* 105*  ?BUN 11 12  ?CREATININE 1.14 1.13  ?CALCIUM 9.6 9.1  ? ?PT/INR ?No results for input(s): LABPROT, INR in the last 72 hours. ?ABG ?No results for input(s): PHART, HCO3 in the last 72 hours. ? ?Invalid input(s): PCO2, PO2 ? ?Studies/Results: ?DG Abdomen 1 View ? ?Result Date: 04/25/2021 ?CLINICAL DATA:  Nasogastric tube placement. EXAM: ABDOMEN - 1 VIEW COMPARISON:  Same day. FINDINGS: Nasogastric tube tip is seen in expected position of the proximal stomach. IMPRESSION: Nasogastric tube tip seen in expected position of proximal stomach. Electronically Signed   By: Marijo Conception M.D.   On: 04/25/2021 16:25  ? ?DG Abdomen 1 View ? ?Result Date: 04/25/2021 ?CLINICAL DATA:  Constipation for 3 days, nausea and vomiting today EXAM: ABDOMEN - 1 VIEW COMPARISON:  None FINDINGS: Gaseous distension of colon and some small bowel loops. No gas within rectum or distal sigmoid colon. Pattern could reflect ileus or distal  colonic obstruction. No bowel wall thickening. Osseous structures demineralized. IMPRESSION: Gaseous distention of large and small bowel loops without definite gas in rectum or distal sigmoid colon, question ileus versus distal colonic obstruction. Electronically Signed   By: Lavonia Dana M.D.   On: 04/25/2021 13:19  ? ?CT ABDOMEN PELVIS W CONTRAST ? ?Result Date: 04/25/2021 ?CLINICAL DATA:  Abdominal pain, nausea and vomiting, and constipation for several days. EXAM: CT ABDOMEN AND PELVIS WITH CONTRAST TECHNIQUE: Multidetector CT imaging of the abdomen and pelvis was performed using the standard protocol following bolus administration of intravenous contrast. RADIATION DOSE REDUCTION: This exam was performed according to the departmental dose-optimization program which includes automated exposure control, adjustment of the mA and/or kV according to patient size and/or use of iterative reconstruction technique. CONTRAST:  154m OMNIPAQUE IOHEXOL 300 MG/ML  SOLN COMPARISON:  01/18/2021 FINDINGS: Lower Chest: No acute findings. Hepatobiliary: No hepatic masses identified. Gallbladder is unremarkable. No evidence of biliary ductal dilatation. Pancreas:  No mass or inflammatory changes. Spleen: Within normal limits in size and appearance. Adrenals/Urinary Tract: No masses identified. Tiny cyst noted in the medial midpole of the right kidney. No evidence of ureteral calculi or hydronephrosis. Stomach/Bowel: Mild-to-moderate dilatation of small bowel loops is seen diffusely, without evidence of transition point. Mild colonic distention is also seen containing air-fluid levels throughout. This is most consistent with an adynamic ileus. No evidence of bowel wall thickening or focal inflammatory process. Vascular/Lymphatic: No pathologically enlarged lymph nodes. No acute  vascular findings. Aortic atherosclerotic calcification noted. Reproductive:  No mass or other significant abnormality. Other:  None. Musculoskeletal:  No  suspicious bone lesions identified. IMPRESSION: Diffuse small bowel dilatation and colonic distention, most consistent with adynamic ileus. No evidence of focal inflammatory process, abscess, or mass. Aortic Atherosclerosis (ICD10-I70.0). Electronically Signed   By: Marlaine Hind M.D.   On: 04/25/2021 14:28  ? ?DG ABD ACUTE 2+V W 1V CHEST ? ?Result Date: 04/27/2021 ?CLINICAL DATA:  Ileus, abdominal pain, emesis EXAM: DG ABDOMEN ACUTE WITH 1 VIEW CHEST COMPARISON:  KUB dated 1 day prior FINDINGS: The enteric catheter tip and sidehole project over the stomach. There is mild gaseous distention of the bowel, overall similar to the prior study. There is no definite free intraperitoneal air. There is no abnormal soft tissue calcification. Heart is mildly enlarged. The upper mediastinal contours are within normal limits. There is no focal consolidation or pulmonary edema. There is no pleural effusion or pneumothorax. IMPRESSION: 1. Enteric catheter tip and sidehole project over the stomach. 2. Overall similar gaseous distention of the bowel most suggestive of ileus. Electronically Signed   By: Valetta Mole M.D.   On: 04/27/2021 08:22  ? ?DG ABD ACUTE 2+V W 1V CHEST ? ?Result Date: 04/26/2021 ?CLINICAL DATA:  Follow-up ileus versus small-bowel obstruction. EXAM: DG ABDOMEN ACUTE WITH 1 VIEW CHEST COMPARISON:  CT abdomen and pelvis with contrast yesterday at 2:09 p.m., PA Lat chest 03/30/2018. FINDINGS: PA chest: There is perihilar linear atelectasis. The lungs are clear of focal infiltrates. No pleural effusion is seen. There is mild aortic tortuosity and atherosclerosis with stable mediastinum. NGT is well inside the stomach with tip in the antrum. Unremarkable thoracic cage. Abdomen: There is mild gas distention of the colon. There are central abdominal aerated small bowel segments dilated up to 4.1 cm. There has been no noteworthy interval change in the small-bowel distention. Colonic gas is visible at least as far as the  distal descending colon. No pathologic calcifications are noted aside from aortoiliac calcification. There is no supine evidence of free air. There are stable visceral shadows. Exam technically limited due to large body habitus. IMPRESSION: No interval worsening in gas distention of the colon and dilatation of the central abdominal small bowel. This could be due to adynamic ileus or distal small bowel obstruction. Ileus is currently favored. NGT is adequately placed. Electronically Signed   By: Telford Nab M.D.   On: 04/26/2021 07:23   ? ?Anti-infectives: ?Anti-infectives (From admission, onward)  ? ? None  ? ?  ? ? ?Assessment/Plan: ? ?Ileus w slow improvement ?Keep ngt since still high output ?Add albumin today ?Kub in am ?No need surgical intervention at this time ?  ?Caroleen Hamman, MD, FACS ? ?04/27/2021 ? ? ? ?  ?

## 2021-04-27 NOTE — Plan of Care (Signed)

## 2021-04-28 ENCOUNTER — Inpatient Hospital Stay: Payer: Medicare Other

## 2021-04-28 LAB — BASIC METABOLIC PANEL
Anion gap: 6 (ref 5–15)
BUN: 12 mg/dL (ref 8–23)
CO2: 26 mmol/L (ref 22–32)
Calcium: 8.9 mg/dL (ref 8.9–10.3)
Chloride: 104 mmol/L (ref 98–111)
Creatinine, Ser: 1.18 mg/dL (ref 0.61–1.24)
GFR, Estimated: >60 mL/min (ref >60)
Glucose, Bld: 81 mg/dL (ref 70–99)
Potassium: 3.8 mmol/L (ref 3.5–5.1)
Sodium: 136 mmol/L (ref 135–145)

## 2021-04-28 LAB — CBC
HCT: 50.5 % (ref 39.0–52.0)
Hemoglobin: 16.4 g/dL (ref 13.0–17.0)
MCH: 29.4 pg (ref 26.0–34.0)
MCHC: 32.5 g/dL (ref 30.0–36.0)
MCV: 90.5 fL (ref 80.0–100.0)
Platelets: 141 10*3/uL — ABNORMAL LOW (ref 150–400)
RBC: 5.58 MIL/uL (ref 4.22–5.81)
RDW: 12.9 % (ref 11.5–15.5)
WBC: 7.9 10*3/uL (ref 4.0–10.5)
nRBC: 0 % (ref 0.0–0.2)

## 2021-04-28 MED ORDER — INFLUENZA VAC A&B SA ADJ QUAD 0.5 ML IM PRSY
0.5000 mL | PREFILLED_SYRINGE | INTRAMUSCULAR | Status: DC
  Filled 2021-04-28: qty 0.5

## 2021-04-28 NOTE — Progress Notes (Signed)
CC: Ileus ?Subjective: ?Keeps improving , NG 650cc ?+ flatus ?+ BM last night ?KUB pers. reviewed some improvement in ileus, gas in colon ? ?Objective: ?Vital signs in last 24 hours: ?Temp:  [97.5 ?F (36.4 ?C)-98.2 ?F (36.8 ?C)] 98.2 ?F (36.8 ?C) (03/10 2774) ?Pulse Rate:  [100-103] 101 (03/10 0743) ?Resp:  [16-22] 16 (03/10 0743) ?BP: (147-157)/(84-97) 157/92 (03/10 0743) ?SpO2:  [94 %-99 %] 99 % (03/10 0743) ?Last BM Date : 04/27/21 ? ?Intake/Output from previous day: ?03/09 0701 - 03/10 0700 ?In: 128 [I.V.:750; NG/GT:120] ?Out: 1225 [Urine:575; Emesis/NG output:650] ?Intake/Output this shift: ?Total I/O ?In: 1257.3 [I.V.:1257.3] ?Out: 350 [Urine:250; Emesis/NG output:100] ? ?Physical exam: ? ?NAD alert ?Abd: soft, very minimal tenderness to palpation, certainly continued to improve. No peritonitis ?Ext: well perfused and warm ? ?Lab Results: ?CBC  ?Recent Labs  ?  04/27/21 ?0413 04/28/21 ?0308  ?WBC 10.3 7.9  ?HGB 17.4* 16.4  ?HCT 52.5* 50.5  ?PLT 152 141*  ? ?BMET ?Recent Labs  ?  04/27/21 ?0413 04/28/21 ?0308  ?NA 136 136  ?K 4.2 3.8  ?CL 102 104  ?CO2 27 26  ?GLUCOSE 105* 81  ?BUN 12 12  ?CREATININE 1.13 1.18  ?CALCIUM 9.1 8.9  ? ?PT/INR ?No results for input(s): LABPROT, INR in the last 72 hours. ?ABG ?No results for input(s): PHART, HCO3 in the last 72 hours. ? ?Invalid input(s): PCO2, PO2 ? ?Studies/Results: ?DG ABD ACUTE 2+V W 1V CHEST ? ?Result Date: 04/28/2021 ?CLINICAL DATA:  Ileus EXAM: DG ABDOMEN ACUTE WITH 1 VIEW CHEST COMPARISON:  Abdominal x-ray 04/27/2021 FINDINGS: Enteric tube tip in the stomach. Multiple abnormally distended loops of small bowel are again seen measuring up to 4.8 cm in diameter. Bowel gas seen throughout portions of the colon. No suspicious calcifications identified. IMPRESSION: Persistent abnormal bowel-gas pattern which may represent ileus or obstruction. Continued follow-up recommended. Electronically Signed   By: Ofilia Neas M.D.   On: 04/28/2021 08:19  ? ?DG ABD ACUTE  2+V W 1V CHEST ? ?Result Date: 04/27/2021 ?CLINICAL DATA:  Ileus, abdominal pain, emesis EXAM: DG ABDOMEN ACUTE WITH 1 VIEW CHEST COMPARISON:  KUB dated 1 day prior FINDINGS: The enteric catheter tip and sidehole project over the stomach. There is mild gaseous distention of the bowel, overall similar to the prior study. There is no definite free intraperitoneal air. There is no abnormal soft tissue calcification. Heart is mildly enlarged. The upper mediastinal contours are within normal limits. There is no focal consolidation or pulmonary edema. There is no pleural effusion or pneumothorax. IMPRESSION: 1. Enteric catheter tip and sidehole project over the stomach. 2. Overall similar gaseous distention of the bowel most suggestive of ileus. Electronically Signed   By: Valetta Mole M.D.   On: 04/27/2021 08:22   ? ?Anti-infectives: ?Anti-infectives (From admission, onward)  ? ? None  ? ?  ? ? ?Assessment/Plan: ?Ileus improving ?Clamping Ng trial ?May do clear for supper ?No need for surgical intervention ?We will follow ? ?Caroleen Hamman, MD, FACS ? ?04/28/2021 ? ? ? ?  ?

## 2021-04-28 NOTE — Progress Notes (Signed)
?Progress Note ? ? ?Patient: Zachary Sweeney DOB: [birth year] DOA: 04/25/2021     3 ?DOS: the patient was seen and examined on 04/28/2021 ?  ?Brief hospital course: ? Zachary Sweeney is a 76 y.o. male with medical history significant for GERD, hernia repair, DVT, COPD, sleep apnea, hypertension who presents to the ER for evaluation of a 5-day history of constipation, diffuse abdominal pain rated 10 x 10 in intensity at its worst, nausea and vomiting. ? ?Patient was found to have ileus.  Admitted to hospitalist service with GI and general surgery consulted.  NG tube was placed to wall suction. ? ? ? ?Assessment and Plan: ?* Ileus ([place]) ?Patient presents to the ER for evaluation of diffuse abdominal pain associated with constipation, nausea and vomiting. ?He has a history of recent hernia repair ?CT scan of abdomen and pelvis shows diffuse small bowel dilatation and colonic distention, most consistent with adynamic ileus. ?3/9: KUB Overall similar gaseous distention of the bowel most suggestive ?of ileus. ?-- Continue NG tube for decompression ?--NPO, ice chips or sip & spit water okay ?--Pain control with IV morphine ?--Antiemetics as needed ?--IV PPI  ?--IV fluid hydration ?--General surgery following, appreciate recommendations ? ?Emphysema of lung ([place]) ?Stable and not acutely exacerbated ?Continue as needed bronchodilator therapy and inhaled steroids ? ?Acid reflux ?IV PPI until taking PO ? ?Deep vein thrombosis (DVT) of popliteal vein of right lower extremity (HCC) ?Patient has a history of recurrent DVT and takes Eliquis. ?-- Hold Eliquis ?--Full dose Lovenox for now in case of need for surgical intervention ? ? ?Obesity, unspecified ?Patient has a BMI of 39.56 kg/m2 ?Complicates overall prognosis and care ?Lifestyle modification and exercise has been discussed with patient in detail ? ?Essential hypertension ?BP uncontrolled due to pain, PO meds held. ?As needed IV hydralazine. ?Hold oral Cardizem  for now while NPO. ? ? ? ? ?  ? ?Subjective: Patient awake resting in bed when seen today.  He reports abdomen feeling slightly better today.  Says he has not passed gas or stool today but was yesterday.  Denies feeling like any worsening distention and denies nausea or vomiting.  No other acute complaints.  He reports that surgery mentioned trying the NG tube clamped soon. ? ?Physical Exam: ?Vitals:  ? 04/27/21 1609 04/27/21 2018 04/28/21 0400 04/28/21 0743  ?BP: (!) 148/97 (!) 147/84 (!) 153/89 (!) 157/92  ?Pulse: (!) 103 100 (!) 103 (!) 101  ?Resp: 16 (!) '22 18 16  '$ ?Temp: (!) 97.5 ?F (36.4 ?C) 98 ?F (36.7 ?C) 97.9 ?F (36.6 ?C) 98.2 ?F (36.8 ?C)  ?TempSrc:   Oral   ?SpO2: 94% 94%  99%  ?Weight:      ?Height:      ? ?General exam: awake, alert, no acute distress, obese ?HEENT: NG tube in place connected to wall suction with about 500 cc fluid in canister moist mucus membranes, hearing grossly normal  ?Respiratory system: CTAB, no wheezes, rales or rhonchi, normal respiratory effort. ?Cardiovascular system: normal S1/S2, RRR, no pedal edema.   ?Gastrointestinal system: Mildly distended abdomen but improved, nontender, unable to appreciate bowel sounds. ?Central nervous system: A&O x3. no gross focal neurologic deficits, normal speech ?Extremities: moves all, no edema, normal tone ?Skin: dry, intact, normal temperature ?Psychiatry: normal mood, congruent affect, judgement and insight appear normal ? ? ? ?Data Reviewed: ? ?Labs reviewed and notable only for mild decrease in platelet count 141. ? ?KUB this morning -  ?Persistent abnormal bowel-gas pattern  which may represent ileus or ?obstruction. Continued follow-up recommended. ? ?Family Communication: None at bedside.  Daughter at bedside on rounds yesterday ? ?Disposition: ?Status is: Inpatient ?Remains inpatient appropriate because: Severity of illness with persistent ileus still n.p.o. with NG tube in place ? ? Planned Discharge Destination: Home ? ? ? ?Time  spent: 35 minutes ? ?Author: ?Ezekiel Slocumb, DO ?04/28/2021 12:56 PM ? ?For on call review [email or web address].  ?

## 2021-04-28 NOTE — Care Management Important Message (Signed)
Important Message ? ?Patient Details  ?Name: Zachary Sweeney ?MRN: [id number] ?Date of Birth: 02/10/1946 ? ? ?Medicare Important Message Given:  Yes ? ? ? ? ?Juliann Pulse A Bryceton Hantz ?04/28/2021, 12:31 PM ?

## 2021-04-29 LAB — BASIC METABOLIC PANEL
Anion gap: 8 (ref 5–15)
BUN: 10 mg/dL (ref 8–23)
CO2: 26 mmol/L (ref 22–32)
Calcium: 9 mg/dL (ref 8.9–10.3)
Chloride: 102 mmol/L (ref 98–111)
Creatinine, Ser: 1.12 mg/dL (ref 0.61–1.24)
GFR, Estimated: >60 mL/min (ref >60)
Glucose, Bld: 92 mg/dL (ref 70–99)
Potassium: 3.7 mmol/L (ref 3.5–5.1)
Sodium: 136 mmol/L (ref 135–145)

## 2021-04-29 LAB — CBC
HCT: 49.6 % (ref 39.0–52.0)
Hemoglobin: 16.6 g/dL (ref 13.0–17.0)
MCH: 29.8 pg (ref 26.0–34.0)
MCHC: 33.5 g/dL (ref 30.0–36.0)
MCV: 89 fL (ref 80.0–100.0)
Platelets: 143 10*3/uL — ABNORMAL LOW (ref 150–400)
RBC: 5.57 MIL/uL (ref 4.22–5.81)
RDW: 12.7 % (ref 11.5–15.5)
WBC: 8.1 10*3/uL (ref 4.0–10.5)
nRBC: 0 % (ref 0.0–0.2)

## 2021-04-29 MED ORDER — DILTIAZEM HCL ER COATED BEADS 300 MG PO CP24
300.0000 mg | ORAL_CAPSULE | Freq: Every day | ORAL | Status: DC
Start: 2021-04-29 — End: 2021-05-02
  Administered 2021-04-29 – 2021-05-02 (×4): 300 mg via ORAL
  Filled 2021-04-29 (×4): qty 1

## 2021-04-29 NOTE — Progress Notes (Addendum)
Subjective:  ?CC: [name] is a 76 y.o. male  Hospital stay day 4,   ileus ? ?HPI: ?No acute issues overnight.  Still complains of pain and bloating but recorded BM yesterday on clears, denies N/V. ? ?ROS:  ?General: Denies weight loss, weight gain, fatigue, fevers, chills, and night sweats. ?Heart: Denies chest pain, palpitations, racing heart, irregular heartbeat, leg pain or swelling, and decreased activity tolerance. ?Respiratory: Denies breathing difficulty, shortness of breath, wheezing, cough, and sputum. ?GI: Denies change in appetite, heartburn, nausea, vomiting, constipation, diarrhea, and blood in stool. ?GU: Denies difficulty urinating, pain with urinating, urgency, frequency, blood in urine. ? ? ?Objective:  ? ?Temp:  [98.2 ?F (36.8 ?C)-98.9 ?F (37.2 ?C)] 98.9 ?F (37.2 ?C) (03/11 0801) ?Pulse Rate:  [98-109] 98 (03/11 0801) ?Resp:  [18] 18 (03/11 0801) ?BP: (139-158)/(84-100) 153/95 (03/11 0801) ?SpO2:  [93 %-98 %] 93 % (03/11 0801)     Height: '6\' 1"'$  (185.4 cm) Weight: 136 kg BMI (Calculated): 39.57  ? ?Intake/Output this shift:  ? ?Intake/Output Summary (Last 24 hours) at 04/29/2021 0959 ?Last data filed at 04/29/2021 0440 ?Gross per 24 hour  ?Intake 1762.72 ml  ?Output 1000 ml  ?Net 762.72 ml  ? ? ?Constitutional :  alert, cooperative, appears stated age, and no distress  ?Respiratory:  clear to auscultation bilaterally  ?Cardiovascular:  regular rate and rhythm  ?Gastrointestinal: Soft, no guarding, focal TTP in epigastric and LLQ .   ?Skin: Cool and moist.   ?Psychiatric: Normal affect, non-agitated, not confused  ?   ?  ?LABS:  ?CMP Latest Ref Rng & Units 04/29/2021 04/28/2021 04/27/2021  ?Glucose 70 - 99 mg/dL 92 81 105(H)  ?BUN 8 - 23 mg/dL '10 12 12  '$ ?Creatinine 0.61 - 1.24 mg/dL 1.12 1.18 1.13  ?Sodium 135 - 145 mmol/L 136 136 136  ?Potassium 3.5 - 5.1 mmol/L 3.7 3.8 4.2  ?Chloride 98 - 111 mmol/L 102 104 102  ?CO2 22 - 32 mmol/L '26 26 27  '$ ?Calcium 8.9 - 10.3 mg/dL 9.0 8.9 9.1  ?Total Protein  6.5 - 8.1 g/dL - - -  ?Total Bilirubin 0.3 - 1.2 mg/dL - - -  ?Alkaline Phos 38 - 126 U/L - - -  ?AST 15 - 41 U/L - - -  ?ALT 0 - 44 U/L - - -  ? ?CBC Latest Ref Rng & Units 04/29/2021 04/28/2021 04/27/2021  ?WBC 4.0 - 10.5 K/uL 8.1 7.9 10.3  ?Hemoglobin 13.0 - 17.0 g/dL 16.6 16.4 17.4(H)  ?Hematocrit 39.0 - 52.0 % 49.6 50.5 52.5(H)  ?Platelets 150 - 400 K/uL 143(L) 141(L) 152  ? ? ?RADS: ?N/a ?Assessment:  ? ?Ileus. ? ?Pain still present but on clears and having BM.  Will advance to full liquid and continue to monitor. Stop IVF. ? ?labs/images/medications/previous chart entries reviewed personally and relevant changes/updates noted above. ? ? ?

## 2021-04-29 NOTE — Progress Notes (Signed)
?Progress Note ? ? ?Patient: Zachary Sweeney LOV:[id number] DOB: 11/07/1945 DOA: 04/25/2021     4 ?DOS: the patient was seen and examined on 04/29/2021 ?  ?Brief hospital course: ? Zachary Sweeney is a 76 y.o. male with medical history significant for GERD, hernia repair, DVT, COPD, sleep apnea, hypertension who presents to the ER for evaluation of a 5-day history of constipation, diffuse abdominal pain rated 10 x 10 in intensity at its worst, nausea and vomiting. ? ?Patient was found to have ileus.  Admitted to hospitalist service with GI and general surgery consulted.  NG tube was placed to wall suction. ? ? ? ?Assessment and Plan: ?* Ileus ([place]) ?Patient presents to the ER for evaluation of diffuse abdominal pain associated with constipation, nausea and vomiting. ?He has a history of recent hernia repair ?CT scan of abdomen and pelvis shows diffuse small bowel dilatation and colonic distention, most consistent with adynamic ileus. ?3/11: Improving.  NG tube removed.  Tolerating clear liquids.  Still has bloating and abdominal discomfort.  Passing flatus and liquid stool ?--NG tube removed ?-- Diet advanced to full liquids per surgery ?--Pain control as needed ?--Antiemetics as needed ?--IV PPI, switch back to oral if tolerating tomorrow ?-- Stop IV fluids ?--General surgery following, appreciate recommendations ? ?Emphysema of lung ([place]) ?Stable and not acutely exacerbated ?Continue as needed bronchodilator therapy and inhaled steroids ? ?Acid reflux ?IV PPI until taking PO ? ?Deep vein thrombosis (DVT) of popliteal vein of right lower extremity (HCC) ?Patient has a history of recurrent DVT and takes Eliquis. ?-- Hold Eliquis ?--Full dose Lovenox for now in case of need for surgical intervention ? ? ?Obesity, unspecified ?Patient has a BMI of 39.56 kg/m2 ?Complicates overall prognosis and care ?Lifestyle modification and exercise has been discussed with patient in detail ? ?Essential hypertension ?BP uncontrolled  due to pain, PO meds held. ?As needed IV hydralazine. ?Resume home Cardizem now that tolerating p.o. ? ? ? ? ?  ? ?Subjective: Patient awake resting in bed when seen today.  He is pleased he was able to have the NG tube removed.  Reports tolerating clear liquids well.  Continues to have some abdominal bloating and discomfort.  Passing liquid stool.  No nausea vomiting with p.o. intake. ? ?Physical Exam: ?Vitals:  ? 04/28/21 2044 04/29/21 0440 04/29/21 0801 04/29/21 1118  ?BP: (!) 145/100 (!) 158/99 (!) 153/95 (!) 142/92  ?Pulse: (!) 105 (!) 105 98 (!) 102  ?Resp: '18 18 18 20  '$ ?Temp: 98.2 ?F (36.8 ?C) 98.5 ?F (36.9 ?C) 98.9 ?F (37.2 ?C) 98.7 ?F (37.1 ?C)  ?TempSrc: Oral Oral    ?SpO2: 96% 98% 93% 91%  ?Weight:      ?Height:      ? ?General exam: awake, alert, no acute distress ?HEENT: moist mucus membranes, hearing grossly normal  ?Respiratory system: CTAB, no wheezes, rales or rhonchi, normal respiratory effort. ?Cardiovascular system: normal S1/S2, RRR, no pedal edema.   ?Gastrointestinal system: Mildly distended but softer today, mildly tender on palpation without guarding or rebound ?Central nervous system: A&O x. no gross focal neurologic deficits, normal speech ?Extremities: moves all, no edema, normal tone ?Skin: dry, intact, normal temperature ?Psychiatry: normal mood, congruent affect, judgement and insight appear normal ? ? ? ?Data Reviewed: ? ?Labs reviewed and notable for normal basic metabolic panel.  CBC notable only for platelets 143 ? ?Family Communication: None at bedside, daughter updated at bedside a couple of days ago.  Will attempt to call ? ?  Disposition: ?Status is: Inpatient ?Remains inpatient appropriate because: Severity of illness with still resolving ileus, advancing diet.  Anticipate discharge in 24 to 48 hours if further clinical improvement and cleared by general surgery. ? ? ? Planned Discharge Destination: Home ? ? ? ?Time spent: 35 minutes ? ?Author: ?Ezekiel Slocumb, DO ?04/29/2021  2:05 PM ? ?For on call review [email or web address]  ?

## 2021-04-29 NOTE — Plan of Care (Signed)

## 2021-04-30 ENCOUNTER — Inpatient Hospital Stay: Payer: Medicare Other

## 2021-04-30 LAB — CBC
HCT: 49 % (ref 39.0–52.0)
Hemoglobin: 16.8 g/dL (ref 13.0–17.0)
MCH: 29.8 pg (ref 26.0–34.0)
MCHC: 34.3 g/dL (ref 30.0–36.0)
MCV: 86.9 fL (ref 80.0–100.0)
Platelets: 135 10*3/uL — ABNORMAL LOW (ref 150–400)
RBC: 5.64 MIL/uL (ref 4.22–5.81)
RDW: 12.8 % (ref 11.5–15.5)
WBC: 5.4 10*3/uL (ref 4.0–10.5)
nRBC: 0 % (ref 0.0–0.2)

## 2021-04-30 LAB — BASIC METABOLIC PANEL
Anion gap: 6 (ref 5–15)
BUN: 8 mg/dL (ref 8–23)
CO2: 27 mmol/L (ref 22–32)
Calcium: 9.3 mg/dL (ref 8.9–10.3)
Chloride: 103 mmol/L (ref 98–111)
Creatinine, Ser: 1.19 mg/dL (ref 0.61–1.24)
GFR, Estimated: >60 mL/min (ref >60)
Glucose, Bld: 97 mg/dL (ref 70–99)
Potassium: 3.4 mmol/L — ABNORMAL LOW (ref 3.5–5.1)
Sodium: 136 mmol/L (ref 135–145)

## 2021-04-30 MED ORDER — KETOROLAC TROMETHAMINE 15 MG/ML IJ SOLN
15.0000 mg | Freq: Once | INTRAMUSCULAR | Status: AC
Start: 2021-04-30 — End: 2021-04-30
  Administered 2021-04-30: 15 mg via INTRAVENOUS
  Filled 2021-04-30: qty 1

## 2021-04-30 MED ORDER — POTASSIUM CHLORIDE CRYS ER 20 MEQ PO TBCR
40.0000 meq | EXTENDED_RELEASE_TABLET | Freq: Once | ORAL | Status: AC
  Administered 2021-04-30: 40 meq via ORAL
  Filled 2021-04-30: qty 2

## 2021-04-30 MED ORDER — APIXABAN 2.5 MG PO TABS
2.5000 mg | ORAL_TABLET | Freq: Two times a day (BID) | ORAL | Status: DC
  Administered 2021-04-30 – 2021-05-02 (×5): 2.5 mg via ORAL
  Filled 2021-04-30 (×5): qty 1

## 2021-04-30 MED ORDER — SIMETHICONE 80 MG PO CHEW
80.0000 mg | CHEWABLE_TABLET | Freq: Four times a day (QID) | ORAL | Status: DC | PRN
  Administered 2021-04-30 – 2021-05-01 (×2): 80 mg via ORAL
  Filled 2021-04-30 (×6): qty 1

## 2021-04-30 NOTE — Progress Notes (Signed)
?Progress Note ? ? ?Patient: Zachary Sweeney HYH:[id number] DOB: 1945/12/07 DOA: 04/25/2021     5 ?DOS: the patient was seen and examined on 04/30/2021 ?  ?Brief hospital course: ? Zachary Sweeney is a 76 y.o. male with medical history significant for GERD, hernia repair, DVT, COPD, sleep apnea, hypertension who presents to the ER for evaluation of a 5-day history of constipation, diffuse abdominal pain rated 10 x 10 in intensity at its worst, nausea and vomiting. ? ?Patient was found to have ileus.  Admitted to hospitalist service with GI and general surgery consulted.  NG tube was placed to wall suction. ? ? ? ?Assessment and Plan: ?* Ileus ([place]) ?Patient presents to the ER for evaluation of diffuse abdominal pain associated with constipation, nausea and vomiting. ?He has a history of recent hernia repair ?CT scan of abdomen and pelvis shows diffuse small bowel dilatation and colonic distention, most consistent with adynamic ileus. ?3/12: Tolerating full liquid diet, abdominal distention waxes and wanes.  No nausea vomiting, passing flatus and having BMs. ?--NG tube has been removed ?-- Tolerating full liquids, advance to soft low fiber diet ?--Pain control as needed ?--Antiemetics as needed ?--IV PPI, switch back to oral if tolerating tomorrow ?-- Off IV fluids ?--General surgery following, appreciate recommendations ? ? ? ?Emphysema of lung ([place]) ?Stable and not acutely exacerbated ?Continue as needed bronchodilator therapy and inhaled steroids ? ?Acid reflux ?IV PPI until taking PO ? ?Deep vein thrombosis (DVT) of popliteal vein of right lower extremity (HCC) ?Patient has a history of recurrent DVT and takes Eliquis.  Eliquis was held during admission in case of need for surgical intervention.  Covered with therapeutic dose Lovenox. ?--Resume Eliquis and stop Lovenox ? ?Obesity, unspecified ?Patient has a BMI of 39.56 kg/m2 ?Complicates overall prognosis and care ?Lifestyle modification and exercise has been  discussed with patient in detail ? ?Essential hypertension ?BP uncontrolled due to pain, PO meds held. ?As needed IV hydralazine. ?Continue home Cardizem ? ? ? ? ?  ? ?Subjective: Patient awake resting in bed when seen today.  He is tolerating full liquid diet.  He is abdomen feels a little bit more distended but he said that so because he just ate breakfast.  He does say that his belly gets a little harder after meals but then softens up again more.  He has been passing a lot of flatus, no BMs this morning but he said this is from not eating any solid food.  Requesting diet be advanced and hopes to go home soon. ? ?Physical Exam: ?Vitals:  ? 04/29/21 2127 04/30/21 0021 04/30/21 0409 04/30/21 0729  ?BP: (!) 149/85 129/79 (!) 144/94 (!) 143/87  ?Pulse: 88 98 96 83  ?Resp: '17 18 18 20  '$ ?Temp: 98.1 ?F (36.7 ?C) 98.1 ?F (36.7 ?C) 98.6 ?F (37 ?C) 98.6 ?F (37 ?C)  ?TempSrc: Oral Oral Oral   ?SpO2: 98% 95% 99% 99%  ?Weight:      ?Height:      ? ?General exam: awake, alert, no acute distress ?HEENT: atraumatic, clear conjunctiva, anicteric sclera, moist mucus membranes, hearing grossly normal  ?Respiratory system: normal respiratory effort, on room air. ?Cardiovascular system: RRR, no peripheral edema.   ?Gastrointestinal system: soft, NT, ND, no HSM felt, +bowel sounds. ?Central nervous system: A&O x3. no gross focal neurologic deficits, normal speech ?Extremities: moves all, no edema, normal tone ?Skin: dry, intact, normal temperature ?Psychiatry: normal mood, congruent affect, judgement and insight appear normal ? ? ?Data Reviewed: ? ?  Labs reviewed and notable for potassium 3.4, platelets 135 ? ?Family Communication: None at bedside on rounds.  Patient able to update.  Daughter was at bedside on rounds couple days ago ? ?Disposition: ?Status is: Inpatient ?Remains inpatient appropriate because: Severity of illness advancing diet while ileus continues to resolve.  Requires further close monitoring.  Anticipate discharge  in 24 to 48 hours if cleared by general surgery and tolerating diet ? ? ? Planned Discharge Destination: Home ? ? ? ?Time spent: 35 minutes ? ?Author: ?Zachary Slocumb, DO ?04/30/2021 1:40 PM ? ?For on call review [email or web address].  ?

## 2021-04-30 NOTE — Progress Notes (Addendum)
Subjective:  ?CC: ?Zachary Sweeney is a 76 y.o. male  Hospital stay day 5,   ileus ? ?HPI: ?Advancing to soft diet today, but complaining of worsening pain again, despite having continued BM all last night.  Denies N/V, also denies feeling distended.  Only worsening diffuse pain. ? ?ROS:  ?General: Denies weight loss, weight gain, fatigue, fevers, chills, and night sweats. ?Heart: Denies chest pain, palpitations, racing heart, irregular heartbeat, leg pain or swelling, and decreased activity tolerance. ?Respiratory: Denies breathing difficulty, shortness of breath, wheezing, cough, and sputum. ?GI: Denies change in appetite, heartburn, nausea, vomiting, constipation, diarrhea, and blood in stool. ?GU: Denies difficulty urinating, pain with urinating, urgency, frequency, blood in urine. ? ? ?Objective:  ? ?Temp:  [98.1 ?F (36.7 ?C)-98.6 ?F (37 ?C)] 98.6 ?F (37 ?C) (03/12 0729) ?Pulse Rate:  [83-98] 83 (03/12 0729) ?Resp:  [17-20] 20 (03/12 0729) ?BP: (129-149)/(79-94) 143/87 (03/12 0729) ?SpO2:  [95 %-99 %] 99 % (03/12 0729)     Height: '6\' 1"'$  (185.4 cm) Weight: 136 kg BMI (Calculated): 39.57  ? ?Intake/Output this shift:  ? ?Intake/Output Summary (Last 24 hours) at 04/30/2021 1500 ?Last data filed at 04/30/2021 1418 ?Gross per 24 hour  ?Intake 1260 ml  ?Output 700 ml  ?Net 560 ml  ? ? ?Constitutional :  alert, cooperative, appears stated age, and no distress  ?Respiratory:  clear to auscultation bilaterally  ?Cardiovascular:  regular rate and rhythm  ?Gastrointestinal: Soft, no guarding, but more TTP in all quadrants today compared to yesterday with increased tympany on exam.   ?Skin: Cool and moist.   ?Psychiatric: Normal affect, non-agitated, not confused  ?   ?  ?LABS:  ?CMP Latest Ref Rng & Units 04/30/2021 04/29/2021 04/28/2021  ?Glucose 70 - 99 mg/dL 97 92 81  ?BUN 8 - 23 mg/dL '8 10 12  '$ ?Creatinine 0.61 - 1.24 mg/dL 1.19 1.12 1.18  ?Sodium 135 - 145 mmol/L 136 136 136  ?Potassium 3.5 - 5.1 mmol/L 3.4(L) 3.7 3.8   ?Chloride 98 - 111 mmol/L 103 102 104  ?CO2 22 - 32 mmol/L '27 26 26  '$ ?Calcium 8.9 - 10.3 mg/dL 9.3 9.0 8.9  ?Total Protein 6.5 - 8.1 g/dL - - -  ?Total Bilirubin 0.3 - 1.2 mg/dL - - -  ?Alkaline Phos 38 - 126 U/L - - -  ?AST 15 - 41 U/L - - -  ?ALT 0 - 44 U/L - - -  ? ?CBC Latest Ref Rng & Units 04/30/2021 04/29/2021 04/28/2021  ?WBC 4.0 - 10.5 K/uL 5.4 8.1 7.9  ?Hemoglobin 13.0 - 17.0 g/dL 16.8 16.6 16.4  ?Hematocrit 39.0 - 52.0 % 49.0 49.6 50.5  ?Platelets 150 - 400 K/uL 135(L) 143(L) 141(L)  ? ? ?RADS: ?CLINICAL DATA:  Ileus, upper abdominal pain, evaluate for free air ?  ?EXAM: ?ABDOMEN - 1 VIEW ?  ?COMPARISON:  04/28/2021 ?  ?FINDINGS: ?Mildly distended, gas and fluid-filled loops of small bowel in the ?central abdomen. No free air on erect radiographs. ?  ?IMPRESSION: ?Mildly distended, gas and fluid-filled loops of small bowel in the ?central abdomen. No free air on erect radiographs. ?  ?  ?Electronically Signed ?  By: Delanna Ahmadi M.D. ?  On: 04/30/2021 14:43 ?Assessment:  ? ?Ileus. ? ?Pain worse today with soft food despite relatively benign looking KUB as noted above.   Will try clears again and continue to monitor.  May need to consider mesenteric ischemia workup? ? ?labs/images/medications/previous chart entries reviewed personally and relevant  changes/updates noted above. ? ? ?

## 2021-04-30 NOTE — TOC Progression Note (Addendum)
Transition of Care (TOC) - Progression Note  ? ? ?Patient Details  ?Name: Zachary Sweeney ?MRN: [id number] ?Date of Birth: Jan 22, 1946 ? ?Transition of Care (TOC) CM/SW Contact  ?Anjanette Gilkey, LCSW ?Phone Number: [phone] ?04/30/2021, 12:36 PM ? ?Clinical Narrative:    ? ?Patient has NG tube, pending surgery consult for diet advancement.  Patient independent at home. TOC will continue to follow. ? ?  ?  ? ?Expected Discharge Plan and Services ?  ?  ?  ?  ?  ?                ?  ?  ?  ?  ?  ?  ?  ?  ?  ?  ? ? ?Social Determinants of Health (SDOH) Interventions ?  ? ?Readmission Risk Interventions ?No flowsheet data found. ? ?

## 2021-05-01 LAB — CBC
HCT: 50.4 % (ref 39.0–52.0)
Hemoglobin: 17 g/dL (ref 13.0–17.0)
MCH: 29.4 pg (ref 26.0–34.0)
MCHC: 33.7 g/dL (ref 30.0–36.0)
MCV: 87 fL (ref 80.0–100.0)
Platelets: 154 10*3/uL (ref 150–400)
RBC: 5.79 MIL/uL (ref 4.22–5.81)
RDW: 13 % (ref 11.5–15.5)
WBC: 6.4 10*3/uL (ref 4.0–10.5)
nRBC: 0 % (ref 0.0–0.2)

## 2021-05-01 MED ORDER — SIMETHICONE 80 MG PO CHEW
80.0000 mg | CHEWABLE_TABLET | Freq: Four times a day (QID) | ORAL | Status: DC
  Administered 2021-05-01 – 2021-05-02 (×4): 80 mg via ORAL
  Filled 2021-05-01 (×7): qty 1

## 2021-05-01 NOTE — Progress Notes (Signed)
?Progress Note ? ? ?Patient: Zachary Sweeney GLO:[id number] DOB: [birth year] DOA: 04/25/2021     6 ?DOS: the patient was seen and examined on 05/01/2021 ?  ?Brief hospital course: ? Zachary Sweeney is a 76 y.o. male with medical history significant for GERD, hernia repair, DVT, COPD, sleep apnea, hypertension who presents to the ER for evaluation of a 5-day history of constipation, diffuse abdominal pain rated 10 x 10 in intensity at its worst, nausea and vomiting. ? ?Patient was found to have ileus.  Admitted to hospitalist service with GI and general surgery consulted.  NG tube was placed to wall suction. ? ? ? ?Assessment and Plan: ?* Ileus ([place]) ?Patient presents to the ER for evaluation of diffuse abdominal pain associated with constipation, nausea and vomiting. ?He has a history of recent hernia repair ?CT scan of abdomen and pelvis shows diffuse small bowel dilatation and colonic distention, most consistent with adynamic ileus. ? ?3/13: Had increased distention and abdominal pain with advancement of diet yesterday.  Surgery again made n.p.o. then advance to clears, advancing to full liquids this afternoon. ? ?--NG tube has been removed ?--Diet advancement per general surgery ?--Pain control as needed ?--Antiemetics as needed ?--IV PPI, switch back to oral if tolerating tomorrow ?-- Off IV fluids ?--General surgery following, appreciate recommendations ? ? ? ?Emphysema of lung ([place]) ?Stable and not acutely exacerbated ?Continue as needed bronchodilator therapy and inhaled steroids ? ?Acid reflux ?IV PPI until taking PO ? ?Deep vein thrombosis (DVT) of popliteal vein of right lower extremity (HCC) ?Patient has a history of recurrent DVT and takes Eliquis.  Eliquis was held during admission in case of need for surgical intervention.  Covered with therapeutic dose Lovenox. ?--Resume Eliquis and stop Lovenox ? ?Obesity, unspecified ?Patient has a BMI of 39.56 kg/m2 ?Complicates overall prognosis and care ?Lifestyle  modification and exercise has been discussed with patient in detail ? ?Essential hypertension ?BP uncontrolled due to pain, PO meds held. ?As needed IV hydralazine. ?Continue home Cardizem ? ? ? ? ?  ? ?Subjective: Patient awake resting in bed with son and daughter at bedside when seen this morning.  He reports still passing gas and having BMs but did have increased abdominal pain and distention yesterday says he feels very gassy and bloated.  He has no other acute complaints. ? ?Physical Exam: ?Vitals:  ? 04/30/21 2339 05/01/21 0540 05/01/21 0848 05/01/21 1100  ?BP: 128/81 132/90 118/72 130/66  ?Pulse: 78 84 76 68  ?Resp:  '18 18 18  '$ ?Temp: 98.1 ?F (36.7 ?C) 97.9 ?F (36.6 ?C) 97.8 ?F (36.6 ?C) (!) 97.2 ?F (36.2 ?C)  ?TempSrc: Oral  Oral Oral  ?SpO2: 95% 94% 96% 97%  ?Weight:      ?Height:      ? ?General exam: awake, alert, no acute distress ?HEENT: atraumatic, clear conjunctiva, anicteric sclera, moist mucus membranes, hearing grossly normal  ?Respiratory system: CTAB, no wheezes, rales or rhonchi, normal respiratory effort. ?Cardiovascular system: normal S1/S2, RRR, no JVD, murmurs, rubs, gallops, no pedal edema.   ?Gastrointestinal system: Increased distention from yesterday, remains with mild diffuse tenderness on palpation, unable to appreciate bowel sounds. ?Central nervous system: A&O x3. no gross focal neurologic deficits, normal speech ?Extremities: moves all, no edema, normal tone ?Skin: dry, intact, normal temperature ?Psychiatry: normal mood, congruent affect, judgement and insight appear normal ? ? ? ?Data Reviewed: ? ?Labs reviewed and notable for completely normal CBC.  No new metabolic panel today. ? ?Family Communication: Son and  daughter at bedside on rounds today ? ?Disposition: ?Status is: Inpatient ?Remains inpatient appropriate because: Severity of illness with persistent ileus, did not tolerate advancement of diet, evaluation ongoing ? ? ? Planned Discharge Destination: Home ? ? ? ?Time spent:  35 minutes ? ?Author: ?Ezekiel Slocumb, DO ?05/01/2021 2:00 PM ? ?For on call review [email or web address]  ?

## 2021-05-01 NOTE — Plan of Care (Signed)

## 2021-05-01 NOTE — Progress Notes (Signed)
05/01/2021 ? ?Subjective: ?Patient had some abdominal pain with advancement of diet from clears to full yesterday.  KUB showed some areas of bowel dilation.  His pain this morning has resolved and he has continued having bowel movements after this episode happened yesterday.   ? ?Vital signs: ?Temp:  [97.2 ?F (36.2 ?C)-98.1 ?F (36.7 ?C)] 98.1 ?F (36.7 ?C) (03/13 1557) ?Pulse Rate:  [56-84] 56 (03/13 1557) ?Resp:  [16-18] 16 (03/13 1557) ?BP: (118-148)/(66-92) 122/92 (03/13 1557) ?SpO2:  [93 %-97 %] 93 % (03/13 1557)  ? ?Intake/Output: ?03/12 0701 - 03/13 0700 ?In: 1280 [P.O.:1280] ?Out: 600 [Urine:600] ?Last BM Date : 05/01/21 ? ?Physical Exam: ?Constitutional:  No acute distress ?Abdomen: soft, mildly distended, non-tender. ? ?Labs:  ?Recent Labs  ?  04/30/21 ?5364 05/01/21 ?0410  ?WBC 5.4 6.4  ?HGB 16.8 17.0  ?HCT 49.0 50.4  ?PLT 135* 154  ? ?Recent Labs  ?  04/29/21 ?0408 04/30/21 ?6803  ?NA 136 136  ?K 3.7 3.4*  ?CL 102 103  ?CO2 26 27  ?GLUCOSE 92 97  ?BUN 10 8  ?CREATININE 1.12 1.19  ?CALCIUM 9.0 9.3  ? ?No results for input(s): LABPROT, INR in the last 72 hours. ? ?Imaging: ?No results found. ? ?Assessment/Plan: ?This is a 76 y.o. male with ileus ? ?--Discussed with the patient that it may just have been that his bowels are still waking up from the ileus he's having.  He continues to have bowel function so today we can advance him again to full liquid diet.  However, if he were to have worse pain again, I think it'd be prudent to obtain repeat CT scan to further evaluate. ?--Will continue to follow. ? ? ?I spent 25 minutes dedicated to the care of this patient on the date of this encounter to include pre-visit review of records, face-to-face time with the patient discussing diagnosis and management, and any post-visit coordination of care. ? ?Melvyn Neth, MD ?[facility]  ?

## 2021-05-02 MED ORDER — PANTOPRAZOLE SODIUM 40 MG PO TBEC
40.0000 mg | DELAYED_RELEASE_TABLET | Freq: Every day | ORAL | Status: DC
Start: 2021-05-02 — End: 2021-05-02
  Administered 2021-05-02: 40 mg via ORAL
  Filled 2021-05-02: qty 1

## 2021-05-02 MED ORDER — ONDANSETRON HCL 4 MG PO TABS: 4.0000 mg | ORAL_TABLET | Freq: Four times a day (QID) | ORAL | 0 refills | Status: DC | PRN

## 2021-05-02 MED ORDER — SIMETHICONE 80 MG PO CHEW: 80.0000 mg | CHEWABLE_TABLET | Freq: Four times a day (QID) | ORAL | 0 refills | Status: DC | PRN

## 2021-05-02 NOTE — Care Management Important Message (Signed)
Important Message ? ?Patient Details  ?Name: Zachary Sweeney ?MRN: [id number] ?Date of Birth: 04-03-1945 ? ? ?Medicare Important Message Given:  Yes ? ? ? ? ?Juliann Pulse A Maksym Pfiffner ?05/02/2021, 2:00 PM ?

## 2021-05-02 NOTE — Progress Notes (Signed)
05/02/2021 ? ?Subjective: ?No acute events overnight.  Patient tolerated full liquid diet without any further pain.  Continues to have bowel movements.  Denies having any abdominal pain and feels less tight compared to before. ? ?Vital signs: ?Temp:  [98 ?F (36.7 ?C)-98.7 ?F (37.1 ?C)] 98 ?F (36.7 ?C) (03/14 7915) ?Pulse Rate:  [56-87] 87 (03/14 0837) ?Resp:  [16-18] 18 (03/14 0837) ?BP: (122-132)/(82-92) 128/82 (03/14 0569) ?SpO2:  [93 %-100 %] 100 % (03/14 0837)  ? ?Intake/Output: ?03/13 0701 - 03/14 0700 ?In: 75 [P.O.:420] ?Out: 2251 [Urine:2250; Stool:1] ?Last BM Date : 05/01/21 ? ?Physical Exam: ?Constitutional: No acute distress ?Abdomen: Soft, nondistended, nontender to palpation. ? ?Labs:  ?Recent Labs  ?  04/30/21 ?7948 05/01/21 ?0410  ?WBC 5.4 6.4  ?HGB 16.8 17.0  ?HCT 49.0 50.4  ?PLT 135* 154  ? ?Recent Labs  ?  04/30/21 ?0165  ?NA 136  ?K 3.4*  ?CL 103  ?CO2 27  ?GLUCOSE 97  ?BUN 8  ?CREATININE 1.19  ?CALCIUM 9.3  ? ?No results for input(s): LABPROT, INR in the last 72 hours. ? ?Imaging: ?No results found. ? ?Assessment/Plan: ?This is a 76 y.o. male with ileus now resolving. ? ?- Patient tolerated full liquid diet without any issues yesterday.  Will advance to soft diet this morning. ?- If patient tolerates soft diet today, he may be able to be discharged this afternoon from the surgical standpoint. ?- No follow-up is needed but will leave Dr. Corlis Leak contact information as a precaution in case if patient has any questions or concerns. ? ? ?I spent 25 minutes dedicated to the care of this patient on the date of this encounter to include pre-visit review of records, face-to-face time with the patient discussing diagnosis and management, and any post-visit coordination of care. ? ?Melvyn Neth, MD ?[facility]  ?

## 2021-05-02 NOTE — Plan of Care (Signed)

## 2021-05-02 NOTE — Discharge Summary (Signed)
?Physician Discharge Summary ?  ?Patient: Zachary Sweeney MRN: [id number] DOB: 1945/12/01  ?Admit date:     04/25/2021  ?Discharge date: 05/02/21  ?Discharge Physician: Ezekiel Slocumb  ? ?PCP: Theotis Burrow, MD  ? ?Recommendations at discharge:  ? ? Follow up with PCP in 1-2 weeks ?Repeat BMP/Mg/CBC in 1-2 weeks ?Monitor and follow up on resolution of ileus ?No general surgery follow up required, refer if needed ? ?Discharge Diagnoses: ?Principal Problem: ?  Ileus ([place]) ?Active Problems: ?  Essential hypertension ?  Obesity, unspecified ?  Deep vein thrombosis (DVT) of popliteal vein of right lower extremity (HCC) ?  Acid reflux ?  Emphysema of lung ([place]) ? ?Resolved Problems:  ?Ileus ? ? ?Hospital Course: ? Zachary Sweeney is a 76 y.o. male with medical history significant for GERD, hernia repair, DVT, COPD, sleep apnea, hypertension who presents to the ER for evaluation of a 5-day history of constipation, diffuse abdominal pain rated 10 x 10 in intensity at its worst, nausea and vomiting. ? ?Patient was found to have ileus.  Admitted to hospitalist service with GI and general surgery consulted.  NG tube was placed to wall suction. ? ? ? ?Assessment and Plan: ?* Ileus ([place]) ?Patient presents to the ER for evaluation of diffuse abdominal pain associated with constipation, nausea and vomiting. ?He has a history of recent hernia repair ?CT scan of abdomen and pelvis shows diffuse small bowel dilatation and colonic distention, most consistent with adynamic ileus. ? ?3/13: Had increased distention and abdominal pain with advancement of diet yesterday.  Surgery again made n.p.o. then advance to clears, advancing to full liquids this afternoon. ? ?--NG tube has been removed ?--Diet advancement per general surgery ?--Pain control as needed ?--Antiemetics as needed ?--IV PPI, switch back to oral if tolerating tomorrow ?-- Off IV fluids ?--General surgery following, appreciate recommendations ? ? ? ?Emphysema of  lung ([place]) ?Stable and not acutely exacerbated ?Continue as needed bronchodilator therapy and inhaled steroids ? ?Acid reflux ?IV PPI until taking PO ? ?Deep vein thrombosis (DVT) of popliteal vein of right lower extremity (HCC) ?Patient has a history of recurrent DVT and takes Eliquis.  Eliquis was held during admission in case of need for surgical intervention.  Covered with therapeutic dose Lovenox. ?--Resume Eliquis and stop Lovenox ? ?Obesity, unspecified ?Patient has a BMI of 39.56 kg/m2 ?Complicates overall prognosis and care ?Lifestyle modification and exercise has been discussed with patient in detail ? ?Essential hypertension ?BP uncontrolled due to pain, PO meds held. ?As needed IV hydralazine. ?Continue home Cardizem ? ? ? ? ?  ? ? ?Consultants: General surgery ?Procedures performed: NG tube placement, removal ?Disposition: Home ?Diet recommendation:  ?Discharge Diet Orders (From admission, onward)  ? ?  Start     Ordered  ? 05/02/21 0000  Diet - low sodium heart healthy       ? 05/02/21 1248  ? ?  ?  ? ?  ? ?DIET: SOFT / LOW FIBER ? ? ?DISCHARGE MEDICATION: ?Allergies as of 05/02/2021   ?No Known Allergies ?  ? ?  ?Medication List  ?  ? ?STOP taking these medications   ? ?clotrimazole-betamethasone cream ?Commonly known as: LOTRISONE ?  ? ?  ? ?TAKE these medications   ? ?acetaminophen 500 MG tablet ?Commonly known as: TYLENOL ?Take 1,000 mg by mouth every 6 (six) hours as needed for fever. ?  ?albuterol 108 (90 Base) MCG/ACT inhaler ?Commonly known as: VENTOLIN HFA ?2 puffs every 4 (  four) hours as needed for shortness of breath or wheezing. ?  ?Anoro Ellipta 62.5-25 MCG/ACT Aepb ?Generic drug: umeclidinium-vilanterol ?Inhale 1 puff into the lungs daily. ?  ?diltiazem 300 MG 24 hr capsule ?Commonly known as: CARDIZEM CD ?Take 1 capsule (300 mg total) by mouth daily. ?  ?Eliquis 2.5 MG Tabs tablet ?Generic drug: apixaban ?TAKE 1 TABLET(2.5 MG) BY MOUTH TWICE DAILY ?  ?fluocinonide 0.05 % external  solution ?Commonly known as: LIDEX ?Apply 1 application topically daily. ?  ?ibuprofen 200 MG tablet ?Commonly known as: ADVIL ?Take 200 mg by mouth every 6 (six) hours as needed for moderate pain. ?  ?omeprazole 20 MG capsule ?Commonly known as: PriLOSEC ?Take 1 capsule (20 mg total) by mouth daily. ?  ?ondansetron 4 MG tablet ?Commonly known as: ZOFRAN ?Take 1 tablet (4 mg total) by mouth every 6 (six) hours as needed for nausea. ?  ?simethicone 80 MG chewable tablet ?Commonly known as: MYLICON ?Chew 1 tablet (80 mg total) by mouth 4 (four) times daily as needed (abdominal bloating or gas pains). ?  ? ?  ? ? ?Discharge Exam: ?[name] Weights  ? 04/25/21 1246  ?Weight: 136 kg  ? ?General exam: awake, alert, no acute distress ?HEENT: atraumatic, clear conjunctiva, anicteric sclera, moist mucus membranes, hearing grossly normal  ?Respiratory system: CTAB, no wheezes, rales or rhonchi, normal respiratory effort. ?Cardiovascular system: normal S1/S2, RRR, no JVD, murmurs, rubs, gallops,  no pedal edema.   ?Gastrointestinal system: mildly distended, non-tender, +bowel sounds. ?Central nervous system: A&O x4. no gross focal neurologic deficits, normal speech ?Extremities: moves all, no edema, normal tone ?Psychiatry: normal mood, congruent affect, judgement and insight appear normal ? ? ?Condition at discharge: stable ? ?The results of significant diagnostics from this hospitalization (including imaging, microbiology, ancillary and laboratory) are listed below for reference.  ? ?Imaging Studies: ?DG Abd 1 View ? ?Result Date: 04/30/2021 ?CLINICAL DATA:  Ileus, upper abdominal pain, evaluate for free air EXAM: ABDOMEN - 1 VIEW COMPARISON:  04/28/2021 FINDINGS: Mildly distended, gas and fluid-filled loops of small bowel in the central abdomen. No free air on erect radiographs. IMPRESSION: Mildly distended, gas and fluid-filled loops of small bowel in the central abdomen. No free air on erect radiographs. Electronically Signed    By: Delanna Ahmadi M.D.   On: 04/30/2021 14:43  ? ?DG Abdomen 1 View ? ?Result Date: 04/25/2021 ?CLINICAL DATA:  Nasogastric tube placement. EXAM: ABDOMEN - 1 VIEW COMPARISON:  Same day. FINDINGS: Nasogastric tube tip is seen in expected position of the proximal stomach. IMPRESSION: Nasogastric tube tip seen in expected position of proximal stomach. Electronically Signed   By: Marijo Conception M.D.   On: 04/25/2021 16:25  ? ?DG Abdomen 1 View ? ?Result Date: 04/25/2021 ?CLINICAL DATA:  Constipation for 3 days, nausea and vomiting today EXAM: ABDOMEN - 1 VIEW COMPARISON:  None FINDINGS: Gaseous distension of colon and some small bowel loops. No gas within rectum or distal sigmoid colon. Pattern could reflect ileus or distal colonic obstruction. No bowel wall thickening. Osseous structures demineralized. IMPRESSION: Gaseous distention of large and small bowel loops without definite gas in rectum or distal sigmoid colon, question ileus versus distal colonic obstruction. Electronically Signed   By: Lavonia Dana M.D.   On: 04/25/2021 13:19  ? ?CT ABDOMEN PELVIS W CONTRAST ? ?Result Date: 04/25/2021 ?CLINICAL DATA:  Abdominal pain, nausea and vomiting, and constipation for several days. EXAM: CT ABDOMEN AND PELVIS WITH CONTRAST TECHNIQUE: Multidetector CT imaging of the  abdomen and pelvis was performed using the standard protocol following bolus administration of intravenous contrast. RADIATION DOSE REDUCTION: This exam was performed according to the departmental dose-optimization program which includes automated exposure control, adjustment of the mA and/or kV according to patient size and/or use of iterative reconstruction technique. CONTRAST:  164m OMNIPAQUE IOHEXOL 300 MG/ML  SOLN COMPARISON:  01/18/2021 FINDINGS: Lower Chest: No acute findings. Hepatobiliary: No hepatic masses identified. Gallbladder is unremarkable. No evidence of biliary ductal dilatation. Pancreas:  No mass or inflammatory changes. Spleen: Within normal  limits in size and appearance. Adrenals/Urinary Tract: No masses identified. Tiny cyst noted in the medial midpole of the right kidney. No evidence of ureteral calculi or hydronephrosis. Stomach/Bowel: Mild-

## 2021-05-11 ENCOUNTER — Other Ambulatory Visit: Payer: Self-pay | Admitting: Oncology

## 2021-05-17 ENCOUNTER — Inpatient Hospital Stay: Payer: Medicare Other | Attending: Oncology | Admitting: Oncology

## 2021-05-31 ENCOUNTER — Other Ambulatory Visit: Payer: Self-pay | Admitting: Orthopedic Surgery

## 2021-05-31 DIAGNOSIS — S46012A Strain of muscle(s) and tendon(s) of the rotator cuff of left shoulder, initial encounter: Secondary | ICD-10-CM

## 2021-06-12 ENCOUNTER — Ambulatory Visit
Admission: RE | Admit: 2021-06-12 | Discharge: 2021-06-12 | Disposition: A | Discharge status: Home / Self Care | Payer: Medicare Other | Source: Ambulatory Visit | Attending: Orthopedic Surgery | Admitting: Orthopedic Surgery

## 2021-06-12 DIAGNOSIS — S46012A Strain of muscle(s) and tendon(s) of the rotator cuff of left shoulder, initial encounter: Secondary | ICD-10-CM | POA: Insufficient documentation

## 2021-06-30 DIAGNOSIS — M7582 Other shoulder lesions, left shoulder: Secondary | ICD-10-CM | POA: Insufficient documentation

## 2021-07-24 ENCOUNTER — Encounter
Admission: RE | Admit: 2021-07-24 | Discharge: 2021-07-24 | Disposition: A | Discharge status: Home / Self Care | Payer: Medicare Other | Source: Ambulatory Visit | Attending: Surgery | Admitting: Surgery

## 2021-07-24 ENCOUNTER — Other Ambulatory Visit: Payer: Self-pay

## 2021-07-24 ENCOUNTER — Other Ambulatory Visit: Payer: Self-pay | Admitting: Surgery

## 2021-07-24 HISTORY — DX: Unspecified osteoarthritis, unspecified site: M19.90

## 2021-07-24 MED ORDER — CEFAZOLIN SODIUM-DEXTROSE 2-4 GM/100ML-% IV SOLN
2.0000 g | INTRAVENOUS | Status: AC
  Administered 2021-07-25: 3 g via INTRAVENOUS

## 2021-07-24 MED ORDER — ORAL CARE MOUTH RINSE: 15.0000 mL | Freq: Once | OROMUCOSAL | Status: AC

## 2021-07-24 MED ORDER — CHLORHEXIDINE GLUCONATE 0.12 % MT SOLN
15.0000 mL | Freq: Once | OROMUCOSAL | Status: AC
  Administered 2021-07-25: 15 mL via OROMUCOSAL

## 2021-07-24 MED ORDER — LACTATED RINGERS IV SOLN: INTRAVENOUS | Status: DC

## 2021-07-24 NOTE — Patient Instructions (Addendum)
Your procedure is scheduled on: 07/25/21 - Tuesday Report to the Registration Desk on the 1st floor of the Home. To find out your arrival time, please call [phone] between 1PM - 3PM on: 07/24/21 - Monday If your arrival time is 6:00 am, do not arrive prior to that time as the [place] entrance doors do not open until 6:00 am.  REMEMBER: Instructions that are not followed completely may result in serious medical risk, up to and including death; or upon the discretion of your surgeon and anesthesiologist your surgery may need to be rescheduled.  Do not eat food after midnight the night before surgery.  No gum chewing, lozengers or hard candies.  You may however, drink CLEAR liquids up to 2 hours before you are scheduled to arrive for your surgery. Do not drink anything within 2 hours of your scheduled arrival time.  Clear liquids include: - water  - apple juice without pulp - gatorade (not RED colors) - black coffee or tea (Do NOT add milk or creamers to the coffee or tea) Do NOT drink anything that is not on this list.  TAKE THESE MEDICATIONS THE MORNING OF SURGERY WITH A SIP OF WATER:  - omeprazole (PRILOSEC) 20 MG capsule, (take one the night before and one on the morning of surgery - helps to prevent nausea after surgery.) - umeclidinium-vilanterol (ANORO ELLIPTA) 62.5-25 MCG/INH AEPB - diltiazem (CARDIZEM CD) 300 MG 24 hr   Use albuterol (VENTOLIN HFA) 108 (90 Base) MCG/ACT inhaler on the day of surgery and bring to the hospital.  Follow recommendations from Cardiologist, Pulmonologist or PCP regarding stopping Aspirin, Coumadin, Plavix, Eliquis, Pradaxa, or Pletal. Stopped taking on 07/23/21 .  One week prior to surgery: Stop Anti-inflammatories (NSAIDS) such as Advil, Aleve, Ibuprofen, Motrin, Naproxen, Naprosyn and Aspirin based products such as Excedrin, Goodys Powder, BC Powder.  Stop ANY OVER THE COUNTER supplements until after surgery.  You may however,  continue to take Tylenol if needed for pain up until the day of surgery.  No Alcohol for 24 hours before or after surgery.  No Smoking including e-cigarettes for 24 hours prior to surgery.  No chewable tobacco products for at least 6 hours prior to surgery.  No nicotine patches on the day of surgery.  Do not use any "recreational" drugs for at least a week prior to your surgery.  Please be advised that the combination of cocaine and anesthesia may have negative outcomes, up to and including death. If you test positive for cocaine, your surgery will be cancelled.  On the morning of surgery brush your teeth with toothpaste and water, you may rinse your mouth with mouthwash if you wish. Do not swallow any toothpaste or mouthwash.  Do not wear jewelry, make-up, hairpins, clips or nail polish.  Do not wear lotions, powders, or perfumes.   Do not shave body from the neck down 48 hours prior to surgery just in case you cut yourself which could leave a site for infection.  Also, freshly shaved skin may become irritated if using the CHG soap.  Contact lenses, hearing aids and dentures may not be worn into surgery.  Do not bring valuables to the hospital. [facility] is not responsible for any missing/lost belongings or valuables.   Notify your doctor if there is any change in your medical condition (cold, fever, infection).  Wear comfortable clothing (specific to your surgery type) to the hospital.  After surgery, you can help prevent lung complications by doing  breathing exercises.  Take deep breaths and cough every 1-2 hours. Your doctor may order a device called an Incentive Spirometer to help you take deep breaths. When coughing or sneezing, hold a pillow firmly against your incision with both hands. This is called "splinting." Doing this helps protect your incision. It also decreases belly discomfort.  If you are being admitted to the hospital overnight, leave your suitcase in the  car. After surgery it may be brought to your room.  If you are being discharged the day of surgery, you will not be allowed to drive home. You will need a responsible adult (18 years or older) to drive you home and stay with you that night.   If you are taking public transportation, you will need to have a responsible adult (18 years or older) with you. Please confirm with your physician that it is acceptable to use public transportation.   Please call the [facility] at [phone] if you have any questions about these instructions.  Surgery Visitation Policy:  Patients undergoing a surgery or procedure may have two family members or support persons with them as long as the person is not COVID-19 positive or experiencing its symptoms.   Inpatient Visitation:    Visiting hours are 7 a.m. to 8 p.m. Up to four visitors are allowed at one time in a patient room, including children. The visitors may rotate out with other people during the day. One designated support person (adult) may remain overnight.

## 2021-07-25 ENCOUNTER — Ambulatory Visit
Admission: RE | Admit: 2021-07-25 | Discharge: 2021-07-25 | Disposition: A | Discharge status: Home / Self Care | Payer: Medicare Other | Source: Ambulatory Visit | Attending: Surgery | Admitting: Surgery

## 2021-07-25 ENCOUNTER — Other Ambulatory Visit: Payer: Self-pay

## 2021-07-25 ENCOUNTER — Ambulatory Visit: Payer: Medicare Other | Admitting: Anesthesiology

## 2021-07-25 ENCOUNTER — Encounter
Admission: RE | Disposition: A | Discharge status: Home / Self Care | Payer: Self-pay | Source: Ambulatory Visit | Attending: Surgery

## 2021-07-25 ENCOUNTER — Encounter: Payer: Self-pay | Admitting: Surgery

## 2021-07-25 ENCOUNTER — Ambulatory Visit: Payer: Medicare Other

## 2021-07-25 DIAGNOSIS — Z86718 Personal history of other venous thrombosis and embolism: Secondary | ICD-10-CM | POA: Insufficient documentation

## 2021-07-25 DIAGNOSIS — S46012A Strain of muscle(s) and tendon(s) of the rotator cuff of left shoulder, initial encounter: Secondary | ICD-10-CM | POA: Insufficient documentation

## 2021-07-25 DIAGNOSIS — I10 Essential (primary) hypertension: Secondary | ICD-10-CM | POA: Insufficient documentation

## 2021-07-25 DIAGNOSIS — M25812 Other specified joint disorders, left shoulder: Secondary | ICD-10-CM | POA: Diagnosis not present

## 2021-07-25 DIAGNOSIS — G4730 Sleep apnea, unspecified: Secondary | ICD-10-CM | POA: Insufficient documentation

## 2021-07-25 DIAGNOSIS — J432 Centrilobular emphysema: Secondary | ICD-10-CM | POA: Diagnosis not present

## 2021-07-25 DIAGNOSIS — J438 Other emphysema: Secondary | ICD-10-CM | POA: Insufficient documentation

## 2021-07-25 DIAGNOSIS — W010XXA Fall on same level from slipping, tripping and stumbling without subsequent striking against object, initial encounter: Secondary | ICD-10-CM | POA: Insufficient documentation

## 2021-07-25 DIAGNOSIS — Y9389 Activity, other specified: Secondary | ICD-10-CM | POA: Insufficient documentation

## 2021-07-25 DIAGNOSIS — Z6839 Body mass index (BMI) 39.0-39.9, adult: Secondary | ICD-10-CM | POA: Insufficient documentation

## 2021-07-25 DIAGNOSIS — E669 Obesity, unspecified: Secondary | ICD-10-CM | POA: Diagnosis not present

## 2021-07-25 DIAGNOSIS — K2100 Gastro-esophageal reflux disease with esophagitis, without bleeding: Secondary | ICD-10-CM

## 2021-07-25 DIAGNOSIS — M19012 Primary osteoarthritis, left shoulder: Secondary | ICD-10-CM | POA: Insufficient documentation

## 2021-07-25 DIAGNOSIS — M7542 Impingement syndrome of left shoulder: Secondary | ICD-10-CM | POA: Diagnosis present

## 2021-07-25 DIAGNOSIS — M67814 Other specified disorders of tendon, left shoulder: Secondary | ICD-10-CM | POA: Insufficient documentation

## 2021-07-25 DIAGNOSIS — Z87891 Personal history of nicotine dependence: Secondary | ICD-10-CM | POA: Diagnosis not present

## 2021-07-25 DIAGNOSIS — K219 Gastro-esophageal reflux disease without esophagitis: Secondary | ICD-10-CM | POA: Diagnosis not present

## 2021-07-25 DIAGNOSIS — K449 Diaphragmatic hernia without obstruction or gangrene: Secondary | ICD-10-CM | POA: Insufficient documentation

## 2021-07-25 HISTORY — PX: SHOULDER ARTHROSCOPY WITH SUBACROMIAL DECOMPRESSION, ROTATOR CUFF REPAIR AND BICEP TENDON REPAIR: SHX5687

## 2021-07-25 SURGERY — SHOULDER ARTHROSCOPY WITH SUBACROMIAL DECOMPRESSION, ROTATOR CUFF REPAIR AND BICEP TENDON REPAIR
Anesthesia: General | Site: Shoulder | Laterality: Left

## 2021-07-25 MED ORDER — LIDOCAINE HCL (PF) 1 % IJ SOLN
INTRAMUSCULAR | Status: AC
  Filled 2021-07-25: qty 5

## 2021-07-25 MED ORDER — DROPERIDOL 2.5 MG/ML IJ SOLN: 0.6250 mg | Freq: Once | INTRAMUSCULAR | Status: DC | PRN

## 2021-07-25 MED ORDER — LIDOCAINE HCL (PF) 1 % IJ SOLN
INTRAMUSCULAR | Status: DC | PRN
  Administered 2021-07-25: 1 mL via SUBCUTANEOUS

## 2021-07-25 MED ORDER — PROPOFOL 10 MG/ML IV BOLUS
INTRAVENOUS | Status: DC | PRN
  Administered 2021-07-25: 150 mg via INTRAVENOUS

## 2021-07-25 MED ORDER — BUPIVACAINE-EPINEPHRINE (PF) 0.5% -1:200000 IJ SOLN
INTRAMUSCULAR | Status: AC
  Filled 2021-07-25: qty 30

## 2021-07-25 MED ORDER — OXYCODONE HCL 5 MG PO TABS: 5.0000 mg | ORAL_TABLET | ORAL | Status: DC | PRN

## 2021-07-25 MED ORDER — OXYCODONE HCL 5 MG PO TABS: 5.0000 mg | ORAL_TABLET | ORAL | 0 refills | Status: DC | PRN

## 2021-07-25 MED ORDER — METOCLOPRAMIDE HCL 5 MG/ML IJ SOLN: 5.0000 mg | Freq: Three times a day (TID) | INTRAMUSCULAR | Status: DC | PRN

## 2021-07-25 MED ORDER — LIDOCAINE HCL (CARDIAC) PF 100 MG/5ML IV SOSY
PREFILLED_SYRINGE | INTRAVENOUS | Status: DC | PRN
  Administered 2021-07-25: 100 mg via INTRAVENOUS

## 2021-07-25 MED ORDER — KETOROLAC TROMETHAMINE 15 MG/ML IJ SOLN: 15.0000 mg | Freq: Once | INTRAMUSCULAR | Status: AC

## 2021-07-25 MED ORDER — MIDAZOLAM HCL 2 MG/2ML IJ SOLN
INTRAMUSCULAR | Status: AC
  Filled 2021-07-25: qty 2

## 2021-07-25 MED ORDER — BUPIVACAINE LIPOSOME 1.3 % IJ SUSP
INTRAMUSCULAR | Status: DC | PRN
  Administered 2021-07-25: 133 mg via PERINEURAL

## 2021-07-25 MED ORDER — CEFAZOLIN SODIUM-DEXTROSE 2-4 GM/100ML-% IV SOLN
INTRAVENOUS | Status: AC
  Filled 2021-07-25: qty 100

## 2021-07-25 MED ORDER — PHENYLEPHRINE 80 MCG/ML (10ML) SYRINGE FOR IV PUSH (FOR BLOOD PRESSURE SUPPORT)
PREFILLED_SYRINGE | INTRAVENOUS | Status: AC
  Filled 2021-07-25: qty 10

## 2021-07-25 MED ORDER — PROPOFOL 10 MG/ML IV BOLUS
INTRAVENOUS | Status: AC
  Filled 2021-07-25: qty 20

## 2021-07-25 MED ORDER — ONDANSETRON HCL 4 MG/2ML IJ SOLN: 4.0000 mg | Freq: Four times a day (QID) | INTRAMUSCULAR | Status: DC | PRN

## 2021-07-25 MED ORDER — SUCCINYLCHOLINE CHLORIDE 200 MG/10ML IV SOSY
PREFILLED_SYRINGE | INTRAVENOUS | Status: DC | PRN
  Administered 2021-07-25: 160 mg via INTRAVENOUS

## 2021-07-25 MED ORDER — METOCLOPRAMIDE HCL 10 MG PO TABS: 5.0000 mg | ORAL_TABLET | Freq: Three times a day (TID) | ORAL | Status: DC | PRN

## 2021-07-25 MED ORDER — CHLORHEXIDINE GLUCONATE 0.12 % MT SOLN
OROMUCOSAL | Status: AC
  Filled 2021-07-25: qty 15

## 2021-07-25 MED ORDER — BUPIVACAINE LIPOSOME 1.3 % IJ SUSP
INTRAMUSCULAR | Status: AC
  Filled 2021-07-25: qty 10

## 2021-07-25 MED ORDER — PHENYLEPHRINE HCL (PRESSORS) 10 MG/ML IV SOLN
INTRAVENOUS | Status: DC | PRN
  Administered 2021-07-25: 160 ug via INTRAVENOUS
  Administered 2021-07-25: 80 ug via INTRAVENOUS
  Administered 2021-07-25: 120 ug via INTRAVENOUS
  Administered 2021-07-25: 80 ug via INTRAVENOUS

## 2021-07-25 MED ORDER — BUPIVACAINE-EPINEPHRINE 0.5% -1:200000 IJ SOLN
INTRAMUSCULAR | Status: DC | PRN
  Administered 2021-07-25: 30 mL

## 2021-07-25 MED ORDER — LIDOCAINE HCL (PF) 2 % IJ SOLN
INTRAMUSCULAR | Status: AC
  Filled 2021-07-25: qty 5

## 2021-07-25 MED ORDER — FENTANYL CITRATE (PF) 100 MCG/2ML IJ SOLN
INTRAMUSCULAR | Status: DC | PRN
  Administered 2021-07-25 (×2): 50 ug via INTRAVENOUS

## 2021-07-25 MED ORDER — ONDANSETRON HCL 4 MG/2ML IJ SOLN
INTRAMUSCULAR | Status: DC | PRN
  Administered 2021-07-25: 4 mg via INTRAVENOUS

## 2021-07-25 MED ORDER — SUGAMMADEX SODIUM 500 MG/5ML IV SOLN
INTRAVENOUS | Status: AC
  Filled 2021-07-25: qty 5

## 2021-07-25 MED ORDER — PROMETHAZINE HCL 25 MG/ML IJ SOLN: 6.2500 mg | INTRAMUSCULAR | Status: DC | PRN

## 2021-07-25 MED ORDER — FENTANYL CITRATE (PF) 100 MCG/2ML IJ SOLN: 25.0000 ug | INTRAMUSCULAR | Status: DC | PRN

## 2021-07-25 MED ORDER — MIDAZOLAM HCL 2 MG/2ML IJ SOLN
INTRAMUSCULAR | Status: DC | PRN
  Administered 2021-07-25: 1 mg via INTRAVENOUS

## 2021-07-25 MED ORDER — OXYCODONE HCL 5 MG PO TABS: 5.0000 mg | ORAL_TABLET | Freq: Once | ORAL | Status: DC | PRN

## 2021-07-25 MED ORDER — MIDAZOLAM HCL 2 MG/2ML IJ SOLN
1.0000 mg | INTRAMUSCULAR | Status: AC | PRN
  Administered 2021-07-25: 1 mg via INTRAVENOUS

## 2021-07-25 MED ORDER — OXYCODONE HCL 5 MG/5ML PO SOLN: 5.0000 mg | Freq: Once | ORAL | Status: DC | PRN

## 2021-07-25 MED ORDER — BUPIVACAINE HCL (PF) 0.5 % IJ SOLN
INTRAMUSCULAR | Status: AC
  Filled 2021-07-25: qty 10

## 2021-07-25 MED ORDER — ROCURONIUM BROMIDE 10 MG/ML (PF) SYRINGE
PREFILLED_SYRINGE | INTRAVENOUS | Status: AC
  Filled 2021-07-25: qty 10

## 2021-07-25 MED ORDER — ACETAMINOPHEN 10 MG/ML IV SOLN
INTRAVENOUS | Status: AC
  Filled 2021-07-25: qty 100

## 2021-07-25 MED ORDER — FENTANYL CITRATE (PF) 100 MCG/2ML IJ SOLN
INTRAMUSCULAR | Status: AC
  Filled 2021-07-25: qty 2

## 2021-07-25 MED ORDER — ROCURONIUM BROMIDE 100 MG/10ML IV SOLN
INTRAVENOUS | Status: DC | PRN
  Administered 2021-07-25: 50 mg via INTRAVENOUS

## 2021-07-25 MED ORDER — SUGAMMADEX SODIUM 500 MG/5ML IV SOLN
INTRAVENOUS | Status: DC | PRN
  Administered 2021-07-25: 300 mg via INTRAVENOUS

## 2021-07-25 MED ORDER — LACTATED RINGERS IR SOLN
Status: DC | PRN
  Administered 2021-07-25 (×3): 3001 mL

## 2021-07-25 MED ORDER — SODIUM CHLORIDE 0.9 % IR SOLN
Status: DC | PRN
  Administered 2021-07-25: 500 mL

## 2021-07-25 MED ORDER — ONDANSETRON HCL 4 MG/2ML IJ SOLN
INTRAMUSCULAR | Status: AC
  Filled 2021-07-25: qty 2

## 2021-07-25 MED ORDER — OMEPRAZOLE 20 MG PO CPDR: 20.0000 mg | DELAYED_RELEASE_CAPSULE | Freq: Every day | ORAL | Status: DC

## 2021-07-25 MED ORDER — KETOROLAC TROMETHAMINE 15 MG/ML IJ SOLN
INTRAMUSCULAR | Status: AC
  Administered 2021-07-25: 15 mg via INTRAVENOUS
  Filled 2021-07-25: qty 1

## 2021-07-25 MED ORDER — SUCCINYLCHOLINE CHLORIDE 200 MG/10ML IV SOSY
PREFILLED_SYRINGE | INTRAVENOUS | Status: AC
  Filled 2021-07-25: qty 10

## 2021-07-25 MED ORDER — BUPIVACAINE HCL (PF) 0.5 % IJ SOLN
INTRAMUSCULAR | Status: DC | PRN
  Administered 2021-07-25: 10 mL via PERINEURAL

## 2021-07-25 MED ORDER — MIDAZOLAM HCL 2 MG/2ML IJ SOLN
INTRAMUSCULAR | Status: AC
  Administered 2021-07-25: 1 mg via INTRAVENOUS
  Filled 2021-07-25: qty 2

## 2021-07-25 MED ORDER — PHENYLEPHRINE HCL-NACL 20-0.9 MG/250ML-% IV SOLN
INTRAVENOUS | Status: DC | PRN
  Administered 2021-07-25: 30 ug/min via INTRAVENOUS

## 2021-07-25 MED ORDER — ACETAMINOPHEN 10 MG/ML IV SOLN
1000.0000 mg | Freq: Once | INTRAVENOUS | Status: DC | PRN
  Administered 2021-07-25: 1000 mg via INTRAVENOUS

## 2021-07-25 MED ORDER — EPINEPHRINE PF 1 MG/ML IJ SOLN
INTRAMUSCULAR | Status: AC
  Filled 2021-07-25: qty 4

## 2021-07-25 MED ORDER — ONDANSETRON HCL 4 MG PO TABS: 4.0000 mg | ORAL_TABLET | Freq: Four times a day (QID) | ORAL | Status: DC | PRN

## 2021-07-25 MED ORDER — PHENYLEPHRINE HCL (PRESSORS) 10 MG/ML IV SOLN
INTRAVENOUS | Status: AC
  Filled 2021-07-25: qty 1

## 2021-07-25 MED ORDER — DEXAMETHASONE SODIUM PHOSPHATE 10 MG/ML IJ SOLN
INTRAMUSCULAR | Status: DC | PRN
  Administered 2021-07-25: 10 mg via INTRAVENOUS

## 2021-07-25 MED ORDER — SODIUM CHLORIDE 0.9 % IV SOLN: INTRAVENOUS | Status: DC

## 2021-07-25 SURGICAL SUPPLY — 53 items
ANCH SUT 2 2.9 2 LD TPR NDL (Anchor) ×1 IMPLANT
ANCH SUT 2 JK 1.5X2.9 2 LD (Anchor) ×1 IMPLANT
ANCHOR JUGGERKNOT WTAP NDL 2.9 (Anchor) ×1 IMPLANT
ANCHOR SUT JK SZ 2 2.9 DBL SL (Anchor) ×1 IMPLANT
APL PRP STRL LF DISP 70% ISPRP (MISCELLANEOUS) ×1
BIT DRILL JUGRKNT W/NDL BIT2.9 (DRILL) IMPLANT
BLADE FULL RADIUS 3.5 (BLADE) ×2 IMPLANT
BUR ACROMIONIZER 4.0 (BURR) ×2 IMPLANT
BUR BR 5.5 WIDE MOUTH (BURR) ×1 IMPLANT
CANNULA SHAVER 8MMX76MM (CANNULA) ×2 IMPLANT
CHLORAPREP W/TINT 26 (MISCELLANEOUS) ×2 IMPLANT
COVER MAYO STAND REUSABLE (DRAPES) ×2 IMPLANT
DILATOR 5.5 THREADED HEALICOIL (MISCELLANEOUS) IMPLANT
DRILL JUGGERKNOT W/NDL BIT 2.9 (DRILL) ×2
ELECT CAUTERY BLADE 6.4 (BLADE) ×2 IMPLANT
ELECT REM PT RETURN 9FT ADLT (ELECTROSURGICAL) ×2
ELECTRODE REM PT RTRN 9FT ADLT (ELECTROSURGICAL) ×1 IMPLANT
GAUZE SPONGE 4X4 12PLY STRL (GAUZE/BANDAGES/DRESSINGS) ×2 IMPLANT
GAUZE XEROFORM 1X8 LF (GAUZE/BANDAGES/DRESSINGS) ×2 IMPLANT
GLOVE BIO SURGEON STRL SZ7.5 (GLOVE) ×4 IMPLANT
GLOVE BIO SURGEON STRL SZ8 (GLOVE) ×4 IMPLANT
GLOVE BIOGEL PI IND STRL 8 (GLOVE) ×1 IMPLANT
GLOVE BIOGEL PI INDICATOR 8 (GLOVE) ×1
GLOVE SURG UNDER LTX SZ8 (GLOVE) ×2 IMPLANT
GOWN STRL REUS W/ TWL LRG LVL3 (GOWN DISPOSABLE) ×1 IMPLANT
GOWN STRL REUS W/ TWL XL LVL3 (GOWN DISPOSABLE) ×1 IMPLANT
GOWN STRL REUS W/TWL LRG LVL3 (GOWN DISPOSABLE) ×2
GOWN STRL REUS W/TWL XL LVL3 (GOWN DISPOSABLE) ×2
GRASPER SUT 15 45D LOW PRO (SUTURE) IMPLANT
IV LACTATED RINGER IRRG 3000ML (IV SOLUTION) ×4
IV LR IRRIG 3000ML ARTHROMATIC (IV SOLUTION) ×2 IMPLANT
KIT CANNULA 8X76-LX IN CANNULA (CANNULA) ×1 IMPLANT
MANIFOLD NEPTUNE II (INSTRUMENTS) ×4 IMPLANT
MASK FACE SPIDER DISP (MASK) ×2 IMPLANT
MAT ABSORB  FLUID 56X50 GRAY (MISCELLANEOUS) ×1
MAT ABSORB FLUID 56X50 GRAY (MISCELLANEOUS) ×1 IMPLANT
PACK ARTHROSCOPY SHOULDER (MISCELLANEOUS) ×2 IMPLANT
PAD ABD DERMACEA PRESS 5X9 (GAUZE/BANDAGES/DRESSINGS) ×3 IMPLANT
PASSER SUT FIRSTPASS SELF (INSTRUMENTS) IMPLANT
SLING ARM LRG DEEP (SOFTGOODS) ×2 IMPLANT
SLING ULTRA II LG (MISCELLANEOUS) ×2 IMPLANT
SPONGE T-LAP 18X18 ~~LOC~~+RFID (SPONGE) ×2 IMPLANT
STAPLER SKIN PROX 35W (STAPLE) ×2 IMPLANT
STRAP SAFETY 5IN WIDE (MISCELLANEOUS) ×2 IMPLANT
SUT ETHIBOND 0 MO6 C/R (SUTURE) ×2 IMPLANT
SUT ULTRABRAID 2 COBRAID 38 (SUTURE) IMPLANT
SUT VIC AB 2-0 CT1 27 (SUTURE) ×4
SUT VIC AB 2-0 CT1 TAPERPNT 27 (SUTURE) ×2 IMPLANT
TAPE MICROFOAM 4IN (TAPE) ×2 IMPLANT
TUBING CONNECTING 10 (TUBING) ×2 IMPLANT
TUBING INFLOW SET DBFLO PUMP (TUBING) ×2 IMPLANT
WAND WEREWOLF FLOW 90D (MISCELLANEOUS) ×2 IMPLANT
WATER STERILE IRR 500ML POUR (IV SOLUTION) ×2 IMPLANT

## 2021-07-25 NOTE — Anesthesia Postprocedure Evaluation (Signed)
Anesthesia Post Note  Patient: Zachary Sweeney  Procedure(s) Performed: LEFT SHOULDER ARTHROSCOPY WITH DEBRIDEMENT, DECOMPRESSION, DISTAL CLAVICLE EXCISION, BICEPS TENODESIS (Left: Shoulder)  Patient location during evaluation: PACU Anesthesia Type: General Level of consciousness: awake and alert Pain management: pain level controlled Vital Signs Assessment: post-procedure vital signs reviewed and stable Respiratory status: spontaneous breathing, nonlabored ventilation and respiratory function stable Cardiovascular status: blood pressure returned to baseline and stable Postop Assessment: no apparent nausea or vomiting Anesthetic complications: no   No notable events documented.   Last Vitals:  Vitals:   07/25/21 1058 07/25/21 1131  BP: 134/78 126/72  Pulse: 65 61  Resp: 16 16  Temp: 36.6 C   SpO2: 93% 96%    Last Pain:  Vitals:   07/25/21 1131  TempSrc:   PainSc: 0-No pain                 [place] Ouch

## 2021-07-25 NOTE — Anesthesia Procedure Notes (Signed)
Procedure Name: Intubation Date/Time: 07/25/2021 7:41 AM Performed by: Fredderick Phenix, CRNA Pre-anesthesia Checklist: Patient identified, Emergency Drugs available, Suction available and Patient being monitored Patient Re-evaluated:Patient Re-evaluated prior to induction Oxygen Delivery Method: Circle system utilized Preoxygenation: Pre-oxygenation with 100% oxygen Induction Type: IV induction Ventilation: Mask ventilation without difficulty Laryngoscope Size: Mac and 4 Grade View: Grade I Tube type: Oral Tube size: 7.5 mm Number of attempts: 1 Airway Equipment and Method: Stylet and Oral airway Placement Confirmation: ETT inserted through vocal cords under direct vision, positive ETCO2 and breath sounds checked- equal and bilateral Secured at: 23 cm Tube secured with: Tape Dental Injury: Teeth and Oropharynx as per pre-operative assessment

## 2021-07-25 NOTE — Anesthesia Preprocedure Evaluation (Addendum)
Anesthesia Evaluation  Patient identified by MRN, date of birth, ID band Patient awake    Reviewed: Allergy & Precautions, H&P , NPO status , Patient's Chart, lab work & pertinent test results  History of Anesthesia Complications Negative for: history of anesthetic complications  Airway Mallampati: III  TM Distance: >3 FB Neck ROM: full    Dental  (+) Upper Dentures, Missing, Poor Dentition Multiple missing lower teeth:   Pulmonary shortness of breath and with exertion, sleep apnea and Continuous Positive Airway Pressure Ventilation , COPD,  COPD inhaler, Not current smoker, former smoker,    breath sounds clear to auscultation       Cardiovascular Exercise Tolerance: Poor hypertension, Pt. on medications (-) angina+ DOE  (-) Past MI and (-) Cardiac Stents (-) dysrhythmias  Rhythm:regular Rate:Normal     Neuro/Psych negative neurological ROS  negative psych ROS   GI/Hepatic Neg liver ROS, hiatal hernia, GERD  Medicated,  Endo/Other  Obesity BMI 39  Renal/GU      Musculoskeletal   Abdominal (+) + obese,   Peds  Hematology negative hematology ROS (+)   Anesthesia Other Findings Past Medical History: No date: Acid reflux No date: Back injury No date: Colon polyp No date: DVT (deep venous thrombosis) ([place])     Comment:  a. 06/2017 LE U/S: Right popliteal vein DVT. No date: Dyspnea on exertion     Comment:  a. 06/2017 CTA Chest: No PE. No significant coronary               Ca2+, centrilobular and paraseptal emphysema;  b.  07/2017              Echo: EF 60-65%, no rwma, Gr1 DD, mildly dil LA. Nl RV               fxn; c. 07/2017 MV: Small, severe defect involving apical               inf and apical segments - only on rest images consistent               w/ artifact. No ischemia or scar. No date: Emphysema of lung ([place])     Comment:  a. 06/2017 CTA Chest: Centrilobular and paraseptal               emphysema, with mild  geographic ground-glass opacity               likely representing small airway dzs. 04/17/2019: Erythrocytosis No date: Hypertension No date: Sleep apnea No date: Torn ligament  Past Surgical History: 2008: COLONOSCOPY 02/27/2017: COLONOSCOPY WITH PROPOFOL; N/A     Comment:  Procedure: COLONOSCOPY WITH PROPOFOL;  Surgeon: Robert Bellow, MD;  Location: [facility] ENDOSCOPY;  Service:               Endoscopy;  Laterality: N/A; 07/24/2018: COLONOSCOPY WITH PROPOFOL; N/A     Comment:  Procedure: COLONOSCOPY WITH PROPOFOL;  Surgeon:               Virgel Manifold, MD;  Location: [facility] ENDOSCOPY;                Service: Endoscopy;  Laterality: N/A; 02/27/2017: ESOPHAGOGASTRODUODENOSCOPY (EGD) WITH PROPOFOL; N/A     Comment:  Procedure: ESOPHAGOGASTRODUODENOSCOPY (EGD) WITH               PROPOFOL;  Surgeon: Robert Bellow, MD;  Location:  [facility];  Service: Endoscopy;  Laterality: N/A; 07/24/2018: ESOPHAGOGASTRODUODENOSCOPY (EGD) WITH PROPOFOL; N/A     Comment:  Procedure: ESOPHAGOGASTRODUODENOSCOPY (EGD) WITH               PROPOFOL;  Surgeon: Virgel Manifold, MD;  Location:               [facility] ENDOSCOPY;  Service: Endoscopy;  Laterality: N/A; No date: HERNIA REPAIR; Right     Comment:  inguinal No date: KNEE SURGERY; Left No date: NECK SURGERY No date: ROTATOR CUFF REPAIR; Right 06/23/7320: UMBILICAL HERNIA REPAIR; N/A     Comment:  Procedure: HERNIA REPAIR UMBILICAL ADULT;  Surgeon:               Ronny Bacon, MD;  Location: [facility] ORS;  Service:               General;  Laterality: N/A;  BMI    Body Mass Index: 39.58 kg/m      Reproductive/Obstetrics negative OB ROS                            Anesthesia Physical  Anesthesia Plan  ASA: 3  Anesthesia Plan: General ETT   Post-op Pain Management: Minimal or no pain anticipated   Induction: Intravenous  PONV Risk Score and Plan: 2 and Ondansetron, Dexamethasone and  Treatment may vary due to age or medical condition  Airway Management Planned: Oral ETT  Additional Equipment:   Intra-op Plan:   Post-operative Plan: Extubation in OR  Informed Consent: I have reviewed the patients History and Physical, chart, labs and discussed the procedure including the risks, benefits and alternatives for the proposed anesthesia with the patient or authorized representative who has indicated his/her understanding and acceptance.     Dental Advisory Given  Plan Discussed with: Anesthesiologist, CRNA and Surgeon  Anesthesia Plan Comments:        Anesthesia Quick Evaluation

## 2021-07-25 NOTE — Discharge Instructions (Addendum)
Orthopedic discharge instructions: Keep dressing dry and intact.  May shower after dressing changed on post-op day #4 (Saturday).  Cover staples with Band-Aids after drying off. Apply ice frequently to shoulder. Take oxycodone as prescribed when needed.  May supplement with ES Tylenol if necessary. Resume Eliquis tomorrow morning (Wednesday) Keep shoulder immobilizer on at all times except may remove for bathing purposes. Follow-up in 10-14 days or as scheduled.   AMBULATORY SURGERY  DISCHARGE INSTRUCTIONS   The drugs that you were given will stay in your system until tomorrow so for the next 24 hours you should not:  Drive an automobile Make any legal decisions Drink any alcoholic beverage   You may resume regular meals tomorrow.  Today it is better to start with liquids and gradually work up to solid foods.  You may eat anything you prefer, but it is better to start with liquids, then soup and crackers, and gradually work up to solid foods.   Please notify your doctor immediately if you have any unusual bleeding, trouble breathing, redness and pain at the surgery site, drainage, fever, or pain not relieved by medication.    Additional Instructions:   Keep the green band on for 4 days      Please contact your physician with any problems or Same Day Surgery at [phone], Monday through Friday 6 am to 4 pm, or [place] at [facility] number at [phone]     Interscalene Nerve Block with Exparel   For your surgery you have received an Interscalene Nerve Block with Exparel. Nerve Blocks affect many types of nerves, including nerves that control movement, pain and normal sensation.  You may experience feelings such as numbness, tingling, heaviness, weakness or the inability to move your arm or the feeling or sensation that your arm has "fallen asleep". A nerve block with Exparel can last up to 5 days.  Usually the weakness wears off first.  The tingling and  heaviness usually wear off next.  Finally you may start to notice pain.  Keep in mind that this may occur in any order.  Once a nerve block starts to wear off it is usually completely gone within 60 minutes. ISNB may cause mild shortness of breath, a hoarse voice, blurry vision, unequal pupils, or drooping of the face on the same side as the nerve block.  These symptoms will usually resolve with the numbness.  Very rarely the procedure itself can cause mild seizures. If needed, your surgeon will give you a prescription for pain medication.  It will take about 60 minutes for the oral pain medication to become fully effective.  So, it is recommended that you start taking this medication before the nerve block first begins to wear off, or when you first begin to feel discomfort. Take your pain medication only as prescribed.  Pain medication can cause sedation and decrease your breathing if you take more than you need for the level of pain that you have. Nausea is a common side effect of many pain medications.  You may want to eat something before taking your pain medicine to prevent nausea. After an Interscalene nerve block, you cannot feel pain, pressure or extremes in temperature in the effected arm.  Because your arm is numb it is at an increased risk for injury.  To decrease the possibility of injury, please practice the following:  While you are awake change the position of your arm frequently to prevent too much pressure on any one area for  prolonged periods of time.  If you have a cast or tight dressing, check the color or your fingers every couple of hours.  Call your surgeon with the appearance of any discoloration (white or blue). If you are given a sling to wear before you go home, please wear it  at all times until the block has completely worn off.  Do not get up at night without your sling. Please contact [facility] or your surgeon if you do not begin to regain sensation after 7 days from  the surgery.  Anesthesia may be contacted by calling the Same Day Surgery Department, Mon. through Fri., 6 am to 4 pm at [phone].   If you experience any other problems or concerns, please contact your surgeon's office. If you experience severe or prolonged shortness of breath go to the nearest emergency department.

## 2021-07-25 NOTE — Op Note (Signed)
07/25/2021  9:42 AM  Patient:   Zachary Sweeney  Pre-Op Diagnosis:   Impingement/tendinopathy with traumatic partial-thickness rotator cuff tear and degenerative joint disease of AC joint, left shoulder.  Post-Op Diagnosis:   Impingement/tendinopathy with traumatic near full-thickness rotator cuff tear, degenerative joint disease of AC joint, degenerative labral fraying, and biceps tendinopathy, left shoulder.  Procedure:   Limited arthroscopic debridement, arthroscopic subacromial decompression, arthroscopic distal clavicle excision, mini-open rotator cuff repair, and mini-open biceps tenodesis, left shoulder.  Anesthesia:   General endotracheal with interscalene block using Exparel placed preoperatively by the anesthesiologist.  Surgeon:   Pascal Lux, MD  Assistant:   Cameron Proud, PA-C  Findings:   As above. There was a near full-thickness bursal sided tear involving the anterior insertional fibers of the supraspinatus tendon, as well as a small articular sided tear involving the superior insertional fibers of the subscapularis tendon (less than 10% of the footprint). The remainder of the rotator cuff was in satisfactory condition. There were areas of labral fraying anteriorly, superiorly, and posteriorly without frank detachment from the glenoid rim. There was moderate "lip sticking" of the biceps tendon without partial or full-thickness tearing. There was a small area of grade 2 chondromalacial changes involving the central glenoid. Otherwise the articular surfaces of the glenoid and humerus were in satisfactory condition.  Complications:   None  Fluids:   800 cc  Estimated blood loss:   10 cc  Tourniquet time:   None  Drains:   None  Closure:   Staples      Brief clinical note:   The patient is a 76 year old male with a 5 month history of right shoulder pain following a injury in which he tripped over his pajamas and fell onto his left shoulder. The patient's symptoms have  progressed despite medications, activity modification, etc. The patient's history and examination are consistent with impingement/tendinopathy with a probable rotator cuff tear. These findings were confirmed by MRI scan. The patient presents at this time for definitive management of these shoulder symptoms.  Procedure:   The patient underwent placement of an interscalene block using Exparel by the anesthesiologist in the preoperative holding area before being brought into the operating room and lain in the supine position. The patient then underwent general endotracheal intubation and anesthesia before being repositioned in the beach chair position using the beach chair positioner. The left shoulder and upper extremity were prepped with ChloraPrep solution before being draped sterilely. Preoperative antibiotics were administered. A timeout was performed to confirm the proper surgical site before the expected portal sites and incision site were injected with 0.5% Sensorcaine with epinephrine.   A posterior portal was created and the glenohumeral joint thoroughly inspected with the findings as described above. An anterior portal was created using an outside-in technique. The labrum and rotator cuff were further probed, again confirming the above-noted findings. Areas of labral fraying anteriorly, superiorly, and posteriorly were debrided back to stable margins using the full-radius resector, as was the small area of articular sided tearing involving the superior insertional fibers of the subscapularis tendon. The ArthroCare wand was inserted and used to obtain hemostasis as well as to "anneal" the labrum superiorly and anteriorly. The instruments were removed from the joint after suctioning the excess fluid.  The camera was repositioned through the posterior portal into the subacromial space. A separate lateral portal was created using an outside-in technique. The 3.5 mm full-radius resector was introduced and  used to perform a subtotal bursectomy. The [employer]  wand was then inserted and used to remove the periosteal tissue off the undersurface of the anterior third of the acromion as well as to recess the coracoacromial ligament from its attachment along the anterior and lateral margins of the acromion. The 4.0 mm acromionizing bur was introduced and used to complete the decompression by removing the undersurface of the anterior third of the acromion. The full radius resector was reintroduced to remove any residual bony debris before the ArthroCare wand was reintroduced to obtain hemostasis.   The ArthroCare wand was used to debride the capsular and periosteal tissues off the anterior, inferior, and posterior portions of the [facility] joint before the 4 mm acromionizer bur was reinserted and used to resect the inferior portion of the distal clavicle. The camera was repositioned in the lateral portal and the 4 mm acromionizer bur introduced through the anterior portal. It was used to remove additional distal clavicle. The 5.5 mm acromionizer bur was then introduced to the anterior portal and used to complete the resection of the distal 8 to 10 mm of distal clavicle. The camera was introduced to the anterior portal to be sure that adequate bone had been resected superiorly and posteriorly. Once this was confirmed, the instruments were removed from the subacromial space after suctioning the excess fluid.  An approximately 4-5 cm incision was made over the anterolateral aspect of the shoulder beginning at the anterolateral corner of the acromion and extending distally in line with the bicipital groove. This incision was carried down through the subcutaneous tissues to expose the deltoid fascia. The raphae between the anterior and middle thirds was identified and this plane developed to provide access into the subacromial space. Additional bursal tissues were debrided sharply using Metzenbaum scissors. The bursal sided near  full-thickness tear involving the insertional fibers of the supraspinatus tendon was readily identified. The margins were debrided sharply with a #15 blade and the exposed greater tuberosity roughened with a rongeur. The tear was repaired using a single Biomet 2.9 mm JuggerKnot anchor. Each suture was passed in a side-to-side fashion across the tear and tied securely to effect the repair. An apparent watertight closure was obtained.  The bicipital groove was identified by palpation and opened for 1-1.5 cm. The biceps tendon stump was retrieved through this defect. The floor of the bicipital groove was roughened with a curet before another Biomet 2.9 mm JuggerKnot anchor was inserted. Both sets of sutures were passed through the biceps tendon and tied securely to effect the tenodesis. The bicipital sheath was reapproximated using two #0 Ethibond interrupted sutures, incorporating the biceps tendon to further reinforce the tenodesis.  The wound was copiously irrigated with sterile saline solution before the deltoid raphae was reapproximated using 2-0 Vicryl interrupted sutures. The subcutaneous tissues were closed in two layers using 2-0 Vicryl interrupted sutures before the skin was closed using staples. The portal sites also were closed using staples. A sterile bulky dressing was applied to the shoulder before the arm was placed into a shoulder immobilizer. The patient was then awakened, extubated, and returned to the recovery room in satisfactory condition after tolerating the procedure well.

## 2021-07-25 NOTE — Anesthesia Procedure Notes (Signed)
Anesthesia Regional Block: Interscalene brachial plexus block   Pre-Anesthetic Checklist: , timeout performed,  Correct Patient, Correct Site, Correct Laterality,  Correct Procedure, Correct Position, site marked,  Risks and benefits discussed,  Surgical consent,  Pre-op evaluation,  At surgeon's request and post-op pain management  Laterality: Upper and Left  Prep: chloraprep       Needles:  Injection technique: Single-shot  Needle Type: Stimiplex     Needle Length: 9cm  Needle Gauge: 22     Additional Needles:   Procedures:,,,, ultrasound used (permanent image in chart),,    Narrative:  Start time: 07/25/2021 7:20 AM End time: 07/25/2021 7:24 AM Injection made incrementally with aspirations every 20 mL.  Performed by: Personally  Anesthesiologist: Iran Ouch, MD  Additional Notes: Patient consented for risk and benefits of nerve block including but not limited to nerve damage, failed block, bleeding and infection.  Patient voiced understanding.  Functioning IV was confirmed and monitors were applied.  Timeout done prior to procedure and prior to any sedation being given to the patient.  Patient confirmed procedure site prior to any sedation given to the patient.  A 42m 22ga Stimuplex needle was used. Sterile prep,hand hygiene and sterile gloves were used.  Minimal sedation used for procedure.  No paresthesia endorsed by patient during the procedure.  Negative aspiration and negative test dose prior to incremental administration of local anesthetic. The patient tolerated the procedure well with no immediate complications.

## 2021-07-25 NOTE — H&P (Signed)
History of Present Illness:  Zachary Sweeney is a 76 y.o. male who presents today as a result of a referral from [facility], PA-C, for left shoulder pain and weakness.   The patient's symptoms began 4 months ago and developed as a result of an injury in which he tripped over his pajamas and fell onto his left shoulder. Initially, he did not seek any treatment as he was recuperating from hernia surgery. However, because of continued symptoms, he saw Cassell Smiles, PA-C, last month. The patient was sent for an MRI scan and referred to me for further evaluation and treatment. The patient describes the symptoms as marked (major pain with significant limitations) and have the quality of being aching, miserable, nagging, stabbing, tender and throbbing. The pain is localized to the lateral arm/shoulder and localized to the anterior shoulder. These symptoms are aggravated with normal daily activities, with sleeping, carrying heavy objects, at higher levels of activity, without relationship to any activity, reaching behind the back and getting dressed. He has tried acetaminophen and non-steroidal anti-inflammatories (Celebrex ) with limited benefit. He has not tried any formal physical therapy or steroid injections for the symptoms. He denies any neck pain, nor does he note any numbness or paresthesias down his arm to his hand. He recalls a similar injury to his right shoulder many years ago which responded well to nonsurgical treatment, but took several years before it "got better".  This complaint is not work related. He is a sports non-participant.  Shoulder Surgical History:  The patient has had no shoulder surgery in the past.  PMH/PSH/Family History/Social History/Meds/Allergies:  I have reviewed past medical, surgical, social and family history, medications and allergies as documented in the EMR.  Current Outpatient Medications:  acetaminophen (TYLENOL) 500 MG tablet Take 1,000 mg by mouth every 6 (six)  hours as needed   albuterol 90 mcg/actuation inhaler Inhale 2 inhalations into the lungs every 6 (six) hours as needed   ANORO ELLIPTA 62.5-25 mcg/actuation inhaler INHALE 1 PUFF INTO THE LUNGS EVERY DAY 1 each 5   apixaban (ELIQUIS) 2.5 mg tablet Take 2.5 mg by mouth 2 (two) times daily   diltiazem (TIAZAC) 300 MG 24 hr capsule once daily   esomeprazole (NEXIUM) 20 MG DR capsule Take 20 mg by mouth 2 (two) times daily.   fluocinonide (LIDEX) 0.05 % external solution Apply topically once daily   ketoconazole (NIZORAL) 2 % cream 2 (two) times daily   ketoconazole (NIZORAL) 2 % shampoo as needed   losartan (COZAAR) 25 MG tablet Take by mouth once daily   omeprazole (PRILOSEC) 20 MG DR capsule TAKE 1 CAPSULE BY MOUTH EVERY DAY FOR REFLUX   simethicone (MYLICON) 80 MG chewable tablet Take 80 mg by mouth 4 (four) times daily as needed   Allergies: No Known Allergies  Past Medical History:   BPH (benign prostatic hypertrophy)   History of chicken pox   Hypertension   Obesity 06/26/2013   Past Surgical History:   COLONOSCOPY 02/20/2000 (Polyps - Dr. Alveta Heimlich at [place] 07/17/2011 (Dr. Darreld Mclean. [place] @ [facility] - Adenomatous Polyps   UMBILICAL HERNIA REPAIR 74/25/9563 (Dr Peyton Najjar - ROBOTIC   ARTHROSCOPIC ROTATOR CUFF REPAIR   HERNIA REPAIR   KNEE ARTHROSCOPY   WISDOM TEETH   Family History:   Lung cancer Mother   Cardiomyopathy Father   Glaucoma Sister   Fibromyalgia Sister   Social History:   Socioeconomic History:   Marital status: Life Partner  Tobacco Use  Smoking status: Former  Packs/day: 1.00  Types: Cigarettes  Quit date: 06/26/1993  Years since quitting: 28.0   Smokeless tobacco: Never  Substance and Sexual Activity   Alcohol use: No   Review of Systems:  A comprehensive 14 point ROS was performed, reviewed, and the pertinent orthopaedic findings are documented in the HPI.  Physical Exam:  Vitals:  06/30/21 1009  BP: 132/84  Weight: (!) 133.4 kg (294 lb 3.2 oz)   Height: 188 cm ('6\' 2"'$ )  PainSc: 2  PainLoc: Shoulder   General/Constitutional: Pleasant overweight elderly male in no acute distress. Neuro/Psych: Normal mood and affect, oriented to person, place and time. Eyes: Non-icteric. Pupils are equal, round, and reactive to light, and exhibit synchronous movement. ENT: Unremarkable. Lymphatic: No palpable adenopathy. Respiratory: No wheezes and Non-labored breathing Cardiovascular: No edema, swelling or tenderness, except as noted in detailed exam. Integumentary: No impressive skin lesions present, except as noted in detailed exam. Musculoskeletal: Unremarkable, except as noted in detailed exam.  Left shoulder exam: SKIN: normal SWELLING: none WARMTH: none LYMPH NODES: no adenopathy palpable CREPITUS: none TENDERNESS: Moderate focal tenderness to palpation over [facility] joint as well as over anterolateral shoulder. ROM (active):  Forward flexion: 140 degrees Abduction: 125 degrees Internal rotation: L2 ROM (passive):  Forward flexion: 150 degrees Abduction: 130 degrees  ER/IR at 90 abd: 45 degrees/50 degrees  He describes moderate to severe pain with forward flexion, abduction, and internal and external rotation at 90 degrees of abduction.  STRENGTH: Forward flexion: 3+-4/5 Abduction: 3+/5 External rotation: 4/5 Internal rotation: 4-4+/5 Pain with RC testing: Moderate to severe pain with resisted abduction and moderate pain with resisted forward flexion and external rotation  STABILITY: Normal  SPECIAL TESTS: Luan Pulling' test: positive, moderate Speed's test: positive Capsulitis - pain w/ passive ER: no Crossed arm test: Mild-moderately positive Crank: Not evaluated Anterior apprehension: Negative Posterior apprehension: Not evaluated  He is neurovascularly intact to the left upper extremity.  Shoulder X-Ray Imaging: True AP, Y-scapular, and axillary views of the left shoulder are obtained. These films demonstrate no evidence for  fractures, lytic lesions, or significant degenerative changes. The subacromial space is mildly decreased. There is no subacromial or infra-clavicular spurring. He demonstrates a Type II acromion.  Left Shoulder Imaging, MRI: MRI Shoulder Cartilage: Partial thickness humeral head cartilage loss. Partial thickness glenoid cartilage loss. MRI Shoulder Rotator Cuff: Partial thickness tear of the supraspinatus. No retraction. MRI Shoulder Labrum / Biceps: No labral tear or biceps abormality. MRI Shoulder Bone: Normal bone.  Both the films and report reviewed by myself and discussed with the patient.  Assessment:   Traumatic incomplete tear of left rotator cuff.   Rotator cuff tendinitis, left.   DJD of left AC (acromioclavicular) joint.   Plan:  The treatment options were discussed with the patient. In addition, patient educational materials were provided regarding the diagnosis and treatment options. The patient is quite frustrated by his symptoms and functional limitations, but like to avoid surgical intervention if at all possible. The patient is offered but declines a steroid injection, nor does he wish to consider formal physical therapy. Therefore, I have recommended that he continue with his normal daily activities, but to avoid offending activities. He may take over-the-counter medications as needed for discomfort. In addition, a prescription for a prednisone taper has been electronically sent to the patient's pharmacy. All of the patient's questions and concerns were answered. He can call any time with further concerns. He will follow up in 6 to 8 weeks  for re-evaluation. This office visit took 50 minutes, of which >50% involved patient counseling/education.   H&P reviewed and patient re-examined. No changes.

## 2021-07-25 NOTE — Transfer of Care (Signed)
Immediate Anesthesia Transfer of Care Note  Patient: Zachary Sweeney  Procedure(s) Performed: LEFT SHOULDER ARTHROSCOPY WITH DEBRIDEMENT, DECOMPRESSION, DISTAL CLAVICLE EXCISION, BICEPS TENODESIS (Left: Shoulder)  Patient Location: PACU  Anesthesia Type:General  Level of Consciousness: awake and alert   Airway & Oxygen Therapy: Patient Spontanous Breathing and Patient connected to nasal cannula oxygen  Post-op Assessment: Report given to RN and Post -op Vital signs reviewed and stable  Post vital signs: Reviewed and stable  Last Vitals:  Vitals Value Taken Time  BP 139/80 07/25/21 0951  Temp    Pulse 80 07/25/21 0957  Resp 26 07/25/21 0957  SpO2 98 % 07/25/21 0957  Vitals shown include unvalidated device data.  Last Pain:  Vitals:   07/25/21 0617  TempSrc: Temporal  PainSc: 0-No pain         Complications: No notable events documented.

## 2021-07-26 ENCOUNTER — Encounter: Payer: Self-pay | Admitting: Surgery

## 2021-10-05 ENCOUNTER — Encounter: Payer: Self-pay | Admitting: Surgery

## 2021-10-16 ENCOUNTER — Encounter: Payer: Self-pay | Admitting: Occupational Therapy

## 2021-10-16 ENCOUNTER — Ambulatory Visit: Payer: Medicare Other | Attending: Surgery | Admitting: Occupational Therapy

## 2021-10-16 DIAGNOSIS — M25532 Pain in left wrist: Secondary | ICD-10-CM | POA: Diagnosis present

## 2021-10-16 DIAGNOSIS — M25642 Stiffness of left hand, not elsewhere classified: Secondary | ICD-10-CM | POA: Diagnosis present

## 2021-10-16 DIAGNOSIS — M79642 Pain in left hand: Secondary | ICD-10-CM | POA: Insufficient documentation

## 2021-10-16 DIAGNOSIS — M25632 Stiffness of left wrist, not elsewhere classified: Secondary | ICD-10-CM | POA: Insufficient documentation

## 2021-10-16 DIAGNOSIS — M6281 Muscle weakness (generalized): Secondary | ICD-10-CM | POA: Insufficient documentation

## 2021-10-16 DIAGNOSIS — R600 Localized edema: Secondary | ICD-10-CM | POA: Diagnosis present

## 2021-10-16 NOTE — Therapy (Signed)
[facility] [address] [place], [address] Phone: [phone]   Fax:  [phone]  Occupational Therapy Evaluation  Patient Details  Name: BENETT SWOYER MRN: [id number] Date of Birth: 1945/07/25 Referring Provider (OT): DR Poggi   Encounter Date: 10/16/2021   OT End of Session - 10/16/21 1310     Visit Number 1    Number of Visits 12    Date for OT Re-Evaluation 11/27/21    OT Start Time 0832    OT Stop Time 0925    OT Time Calculation (min) 53 min    Activity Tolerance Patient tolerated treatment well    Behavior During Therapy [facility] for tasks assessed/performed             Past Medical History:  Diagnosis Date   Acid reflux    Arthritis    Back injury    Colon polyp    DVT (deep venous thrombosis) ([place])    a. 06/2017 LE U/S: Right popliteal vein DVT.   Dyspnea on exertion    a. 06/2017 CTA Chest: No PE. No significant coronary Ca2+, centrilobular and paraseptal emphysema;  b.  07/2017 Echo: EF 60-65%, no rwma, Gr1 DD, mildly dil LA. Nl RV fxn; c. 07/2017 MV: Small, severe defect involving apical inf and apical segments - only on rest images consistent w/ artifact. No ischemia or scar.   Emphysema of lung ([place])    a. 06/2017 CTA Chest: Centrilobular and paraseptal emphysema, with mild geographic ground-glass opacity likely representing small airway dzs.   Erythrocytosis 04/17/2019   Hypertension    Sleep apnea    Torn ligament     Past Surgical History:  Procedure Laterality Date   COLONOSCOPY  2008   COLONOSCOPY WITH PROPOFOL N/A 02/27/2017   Procedure: COLONOSCOPY WITH PROPOFOL;  Surgeon: Robert Bellow, MD;  Location: [facility];  Service: Endoscopy;  Laterality: N/A;   COLONOSCOPY WITH PROPOFOL N/A 07/24/2018   Procedure: COLONOSCOPY WITH PROPOFOL;  Surgeon: Virgel Manifold, MD;  Location: [facility];  Service: Endoscopy;  Laterality: N/A;   ESOPHAGOGASTRODUODENOSCOPY (EGD)  WITH PROPOFOL N/A 02/27/2017   Procedure: ESOPHAGOGASTRODUODENOSCOPY (EGD) WITH PROPOFOL;  Surgeon: Robert Bellow, MD;  Location: [facility];  Service: Endoscopy;  Laterality: N/A;   ESOPHAGOGASTRODUODENOSCOPY (EGD) WITH PROPOFOL N/A 07/24/2018   Procedure: ESOPHAGOGASTRODUODENOSCOPY (EGD) WITH PROPOFOL;  Surgeon: Virgel Manifold, MD;  Location: [facility];  Service: Endoscopy;  Laterality: N/A;   HERNIA REPAIR Right    inguinal   INSERTION OF MESH  02/01/2021   Procedure: INSERTION OF MESH;  Surgeon: Herbert Pun, MD;  Location: [facility];  Service: General;;   KNEE SURGERY Left    NECK SURGERY     ROTATOR CUFF REPAIR Right    SHOULDER ARTHROSCOPY WITH SUBACROMIAL DECOMPRESSION, ROTATOR CUFF REPAIR AND BICEP TENDON REPAIR Left 07/25/2021   Procedure: LEFT SHOULDER ARTHROSCOPY WITH DEBRIDEMENT, DECOMPRESSION, DISTAL CLAVICLE EXCISION, BICEPS TENODESIS;  Surgeon: Corky Mull, MD;  Location: [facility] OR[facility];  Service: Orthopedics;  Laterality: Left;   UMBILICAL HERNIA REPAIR N/A 04/22/2019   Procedure: HERNIA REPAIR UMBILICAL ADULT;  Surgeon: Ronny Bacon, MD;  Location: [facility];  Service: General;  Laterality: N/A;    There were no vitals filed for this visit.   Subjective Assessment - 10/16/21 0951     Subjective  Since my shoulder surgery my hand and wrist is just swollen and stiff.  At hurts when trying to make a fist and bend  my wrist - cannot use it at all.  I can use my hand or open bottles , play guitar or using the bathroom    Pertinent History MUHANNAD BIGNELL is a 76 y.o. male who presents for follow-up with Dr Roland Rack 10/06/21 10 weeks status post a limited arthroscopic debridement, arthroscopic subacromial decompression, arthroscopic distal clavicle excision, mini open rotator cuff repair, and mini-open biceps tenodesis of his left shoulder done 07/25/21. Overall, the patient feels that his shoulder is doing reasonably well. He still notes some soreness in the shoulder  for which he will apply ice and take an occasional oxycodone for discomfort, especially associated with physical therapy. He continues to attend physical therapy, as well as to perform exercises on his own at home and feels that his range of motion and strength continue to improve. He has resumed many of his normal daily activities without difficulty. He does have difficulty sleeping at night, but does not feel that it is due to his shoulder. He denies any recent reinjury to the shoulder, and denies any fevers or chills.    The patient's primary concern today is continued left hand pain and stiffness. He notes that the symptoms have been present since his surgery. They were worse at his last visit, but he notes that the therapist has been working with him and his motion has improved somewhat. However, he still is not yet able to make a fist and has pain with maximal active flexion attempts as well as with passive flexion. He denies any injury to the hand, and denies any numbness or paresthesias to his fingers.  Refer to OT    Patient Stated Goals Want my hand and wrist better the swelling, pain and motion /strength so I can take care of myself - toiletting , gripping things and play the bass    Currently in Pain? Yes    Pain Score 8     Pain Location Wrist    Pain Orientation Left    Pain Descriptors / Indicators Aching;Tightness;Tender;Sore    Pain Type Acute pain    Pain Onset More than a month ago    Pain Frequency Intermittent    Aggravating Factors  making fist , wrist AROM in all planes               [facility] OT Assessment - 10/16/21 0001       Assessment   Medical Diagnosis L hand stiffness and pain    Referring Provider (OT) DR Poggi    Onset Date/Surgical Date 07/25/21    Hand Dominance Right    Prior Therapy PT for shoulder      Home  Environment   Lives With Alone      Prior Function   Vocation Retired    Leisure He has a 8 3 times a week for 1 to 2 hours.  Played guitar at  church, likes to walk      Edema   Edema Wrist increase .8 cm, hand MC 0.5 cm and 3rd prx 0.4 cm      AROM   Left Wrist Extension 55 Degrees    Left Wrist Flexion 55 Degrees    Left Wrist Radial Deviation 18 Degrees    Left Wrist Ulnar Deviation 25 Degrees      Strength   Right Hand Grip (lbs) 95    Right Hand Lateral Pinch 20 lbs    Right Hand 3 Point Pinch 17 lbs    Left Hand Grip (lbs)  18    Left Hand Lateral Pinch 11 lbs    Left Hand 3 Point Pinch 6 lbs      Left Hand AROM   L Thumb Radial ADduction/ABduction 0-55 45    L Thumb Palmar ADduction/ABduction 0-45 40    L Thumb Opposition to Index --   Opposition to 2nd and 3rd   L Index  MCP 0-90 75 Degrees    L Index PIP 0-100 75 Degrees    L Long  MCP 0-90 65 Degrees    L Long PIP 0-100 80 Degrees    L Ring  MCP 0-90 65 Degrees    L Ring PIP 0-100 80 Degrees    L Little  MCP 0-90 55 Degrees    L Little PIP 0-100 75 Degrees                      OT Treatments/Exercises (OP) - 10/16/21 0001       LUE Contrast Bath   Time 8 minutes    Comments prior to review of HEP - derease pain and stiffness               Patient is on using contrast 2-3 times a day to decrease edema as well as increased motion. Further with Isotoner glove to use most of the time in combination with a Tubigrip D and E depending on pain and comfort to decrease edema Reviewed with patient active assisted range of motion for radial ulnar deviation pain-free with glove and Tubigrip on if needed, active range of motion horizontal for wrist flexion extension with glove on pain-free 10 reps. Thumb palmar radial abduction active motion pain-free 10 reps Tendon glides active range of motion pain-free free 10 reps Followed by opposition picking up 2 cm foam block for second third and fourth digit unable to do fifth yet tremoring. 10 reps. Times a day.       OT Education - 10/16/21 1310     Education Details Findings of evaluation and  home program    Person(s) Educated Patient    Methods Explanation;Tactile cues;Verbal cues;Handout;Demonstration    Comprehension Verbalized understanding;Returned demonstration;Verbal cues required;Tactile cues required              OT Short Term Goals - 10/16/21 1318       OT SHORT TERM GOAL #1   Title Patient to be independent in home program to decrease L hand and wrist  edema and pain to have increase left wrist and digit range of motion    Baseline No knowledge of home program to decrease edema and pain-8/10 pain at wrist and digits with range of motion, increase edema at wrist, metacarpals and proximal phalanges    Time 2    Period Weeks    Status New    Target Date 10/30/21               OT Long Term Goals - 10/16/21 1320       OT LONG TERM GOAL #1   Title Left digits active range of motion increased for patient to be able to touch palm to use hand and eating, bathing and dressing with more ease    Baseline L MC's 55- 75 , PIP 's 75 -80 - pain 8/10 with active range of motion-unable to use left hand in ADLs or with severe difficulty    Time 4    Period Weeks    Status New    Target  Date 11/13/21      OT LONG TERM GOAL #2   Title Left wrist active range of motion improved for patient to be able to turn doorknob, push and pull heavy door open without pain    Baseline L wrist ext and flexion 55 , UD 25 and RD 18 - pain 8/10 in AROM and use    Time 6    Period Weeks    Status New    Target Date 11/27/21      OT LONG TERM GOAL #3   Title L thumb AROM increase to [facility] to do buttons, and be able to hygiene and play guitar without increase symptoms    Baseline THumb PA and RA 40-45 , pain and tremoring with opposition - only to 2nd and 3rd - - pain 8/10 in thumb with AROM    Time 6    Period Weeks    Status New    Target Date 11/27/21      OT LONG TERM GOAL #4   Title Left grip and prehension strength increased to more than 50% compared to the right to return  back to prior level of function and ADLs and IADLs    Baseline R grip 95, L 18 lbs, Lat grip R 20 ,L 11, 3 point pinch R 17 and L 6 lbs    Time 6    Period Weeks    Status New    Target Date 11/28/21                   Plan - 10/16/21 1313     Clinical Impression Statement Pt refer to OT s/p limited arthroscopic debridement, arthroscopic subacromial decompression, arthroscopic distal clavicle excision, mini open rotator cuff repair, and mini-open biceps tenodesis of his left shoulder on 07/25/21 - pt cont to have increase pain and stiffness in L hand and wrist. Pt is about 12 wks s/p. Pt with increase edema in L  hand and wrist, pain increase to 8/10 with AROM of attempts to make fist or do wrist AROM. Pt show decrease wrist AROM , digits fisting and thumb AROM - with decrease strength liniting his use of L hand in ADL's and IADL's - pt use hand in bathroom hygiene and playing guitar - and unable to use L hand. Pt can benefit from skilled OT services to decrease edema and pain p with increase ROM and strength in L hand and wrist to return to prior level of function prior to shoulder surgery- pt lives alone - has aid 3 x wk for 1-2 hrs per pt.    OT Occupational Profile and History Problem Focused Assessment - Including review of records relating to presenting problem    Occupational performance deficits (Please refer to evaluation for details): ADL's;IADL's;Play;Social Participation;Leisure    Body Structure / Function / Physical Skills ADL;Coordination;Flexibility;FMC;IADL;ROM;UE functional use;Edema;Dexterity;Pain;Strength    Rehab Potential Good    Clinical Decision Making Limited treatment options, no task modification necessary    Comorbidities Affecting Occupational Performance: None    Modification or Assistance to Complete Evaluation  No modification of tasks or assist necessary to complete eval    OT Frequency 2x / week    OT Duration 6 weeks    OT Treatment/Interventions  Self-care/ADL training;Paraffin;Moist Heat;Fluidtherapy;Contrast Bath;Therapeutic exercise;Manual Therapy;Patient/family education;Passive range of motion;DME and/or AE instruction;Splinting    Consulted and Agree with Plan of Care Patient             Patient will benefit from  skilled therapeutic intervention in order to improve the following deficits and impairments:   Body Structure / Function / Physical Skills: ADL, Coordination, Flexibility, FMC, IADL, ROM, UE functional use, Edema, Dexterity, Pain, Strength       Visit Diagnosis: Pain in left hand - Plan: Ot plan of care cert/re-cert  Pain in left wrist - Plan: Ot plan of care cert/re-cert  Stiffness of left hand, not elsewhere classified - Plan: Ot plan of care cert/re-cert  Stiffness of left wrist, not elsewhere classified - Plan: Ot plan of care cert/re-cert  Localized edema - Plan: Ot plan of care cert/re-cert  Muscle weakness (generalized) - Plan: Ot plan of care cert/re-cert    Problem List Patient Active Problem List   Diagnosis Date Noted   Ileus ([place]) 04/25/2021   Acid reflux    Emphysema of lung ([facility])    S/P umbilical hernia repair, follow-up exam 04/30/2019   Erythrocytosis 04/17/2019   Positive fecal occult blood test    Polyp of colon    Diverticulosis of large intestine without diverticulitis    CLE (columnar lined esophagus)    DOE (dyspnea on exertion) 07/19/2017   Atypical chest pain 07/19/2017   Deep vein thrombosis (DVT) of popliteal vein of right lower extremity ([place]) 07/19/2017   Benign prostatic hyperplasia 06/25/2017   Essential hypertension 06/25/2017   Esophageal dysphagia 01/31/2017   Epigastric pain 01/31/2017   Obesity, unspecified 06/26/2013    Rosalyn Gess, OTR/L,CLT 10/16/2021, 1:26 PM  [facility] [address] [place], [address] Phone: [phone]   Fax:  [phone]  Name: AIKEN WITHEM MRN: [id number] Date of Birth: 1946/02/14

## 2021-10-19 ENCOUNTER — Ambulatory Visit: Payer: Medicare Other | Admitting: Occupational Therapy

## 2021-10-19 DIAGNOSIS — M25642 Stiffness of left hand, not elsewhere classified: Secondary | ICD-10-CM

## 2021-10-19 DIAGNOSIS — M25532 Pain in left wrist: Secondary | ICD-10-CM

## 2021-10-19 DIAGNOSIS — M79642 Pain in left hand: Secondary | ICD-10-CM | POA: Diagnosis not present

## 2021-10-19 DIAGNOSIS — M25632 Stiffness of left wrist, not elsewhere classified: Secondary | ICD-10-CM

## 2021-10-19 DIAGNOSIS — R600 Localized edema: Secondary | ICD-10-CM

## 2021-10-19 DIAGNOSIS — M6281 Muscle weakness (generalized): Secondary | ICD-10-CM

## 2021-10-19 NOTE — Therapy (Signed)
[facility] [address] [place], [address] Phone: [phone]   Fax:  [phone]  Occupational Therapy Treatment  Patient Details  Name: Zachary Sweeney MRN: [id number] Date of Birth: 1945-07-12 Referring Provider (OT): DR Poggi   Encounter Date: 10/19/2021   OT End of Session - 10/19/21 1602     Visit Number 2    Number of Visits 12    Date for OT Re-Evaluation 11/27/21    OT Start Time 1540    OT Stop Time 1616    OT Time Calculation (min) 36 min    Activity Tolerance Patient tolerated treatment well    Behavior During Therapy [facility] for tasks assessed/performed             Past Medical History:  Diagnosis Date   Acid reflux    Arthritis    Back injury    Colon polyp    DVT (deep venous thrombosis) ([place])    a. 06/2017 LE U/S: Right popliteal vein DVT.   Dyspnea on exertion    a. 06/2017 CTA Chest: No PE. No significant coronary Ca2+, centrilobular and paraseptal emphysema;  b.  07/2017 Echo: EF 60-65%, no rwma, Gr1 DD, mildly dil LA. Nl RV fxn; c. 07/2017 MV: Small, severe defect involving apical inf and apical segments - only on rest images consistent w/ artifact. No ischemia or scar.   Emphysema of lung ([place])    a. 06/2017 CTA Chest: Centrilobular and paraseptal emphysema, with mild geographic ground-glass opacity likely representing small airway dzs.   Erythrocytosis 04/17/2019   Hypertension    Sleep apnea    Torn ligament     Past Surgical History:  Procedure Laterality Date   COLONOSCOPY  2008   COLONOSCOPY WITH PROPOFOL N/A 02/27/2017   Procedure: COLONOSCOPY WITH PROPOFOL;  Surgeon: Robert Bellow, MD;  Location: [facility];  Service: Endoscopy;  Laterality: N/A;   COLONOSCOPY WITH PROPOFOL N/A 07/24/2018   Procedure: COLONOSCOPY WITH PROPOFOL;  Surgeon: Virgel Manifold, MD;  Location: [facility] ENDOSCOPY;  Service: Endoscopy;  Laterality: N/A;   ESOPHAGOGASTRODUODENOSCOPY (EGD)  WITH PROPOFOL N/A 02/27/2017   Procedure: ESOPHAGOGASTRODUODENOSCOPY (EGD) WITH PROPOFOL;  Surgeon: Robert Bellow, MD;  Location: [facility]OS[facility];  Service: Endoscopy;  Laterality: N/A;   ESOPHAGOGASTRODUODENOSCOPY (EGD) WITH PROPOFOL N/A 07/24/2018   Procedure: ESOPHAGOGASTRODUODENOSCOPY (EGD) WITH PROPOFOL;  Surgeon: Virgel Manifold, MD;  Location: [facility] ENDOSCOPY;  Service: Endoscopy;  Laterality: N/A;   HERNIA REPAIR Right    inguinal   INSERTION OF MESH  02/01/2021   Procedure: INSERTION OF MESH;  Surgeon: Herbert Pun, MD;  Location: [facility] ORS;  Service: General;;   KNEE SURGERY Left    NECK SURGERY     ROTATOR CUFF REPAIR Right    SHOULDER ARTHROSCOPY WITH SUBACROMIAL DECOMPRESSION, ROTATOR CUFF REPAIR AND BICEP TENDON REPAIR Left 07/25/2021   Procedure: LEFT SHOULDER ARTHROSCOPY WITH DEBRIDEMENT, DECOMPRESSION, DISTAL CLAVICLE EXCISION, BICEPS TENODESIS;  Surgeon: Corky Mull, MD;  Location: [facility] ORS;  Service: Orthopedics;  Laterality: Left;   UMBILICAL HERNIA REPAIR N/A 04/22/2019   Procedure: HERNIA REPAIR UMBILICAL ADULT;  Surgeon: Ronny Bacon, MD;  Location: [facility] ORS;  Service: General;  Laterality: N/A;    There were no vitals filed for this visit.   Subjective Assessment - 10/19/21 1600     Subjective  Shooting pain at night time better- and then duing day now only when trying to use or do my exercises- but better motion and  I was able to grip better during my PT doing the exercises    Pertinent History Zachary Sweeney is a 76 y.o. male who presents for follow-up with Dr Roland Rack 10/06/21 10 weeks status post a limited arthroscopic debridement, arthroscopic subacromial decompression, arthroscopic distal clavicle excision, mini open rotator cuff repair, and mini-open biceps tenodesis of his left shoulder done 07/25/21. Overall, the patient feels that his shoulder is doing reasonably well. He still notes some soreness in the shoulder for which he will apply ice and take  an occasional oxycodone for discomfort, especially associated with physical therapy. He continues to attend physical therapy, as well as to perform exercises on his own at home and feels that his range of motion and strength continue to improve. He has resumed many of his normal daily activities without difficulty. He does have difficulty sleeping at night, but does not feel that it is due to his shoulder. He denies any recent reinjury to the shoulder, and denies any fevers or chills.    The patient's primary concern today is continued left hand pain and stiffness. He notes that the symptoms have been present since his surgery. They were worse at his last visit, but he notes that the therapist has been working with him and his motion has improved somewhat. However, he still is not yet able to make a fist and has pain with maximal active flexion attempts as well as with passive flexion. He denies any injury to the hand, and denies any numbness or paresthesias to his fingers.  Refer to OT    Patient Stated Goals Want my hand and wrist better the swelling, pain and motion /strength so I can take care of myself - toiletting , gripping things and play the bass    Currently in Pain? Yes    Pain Score 5     Pain Location Wrist   hand   Pain Orientation Right    Pain Descriptors / Indicators Aching;Tightness    Pain Type Acute pain    Pain Onset More than a month ago    Pain Frequency Intermittent                [facility] OT Assessment - 10/19/21 0001       AROM   Left Wrist Extension 70 Degrees    Left Wrist Flexion 73 Degrees    Left Wrist Radial Deviation 20 Degrees    Left Wrist Ulnar Deviation 30 Degrees      Strength   Right Hand Grip (lbs) 95    Right Hand Lateral Pinch 20 lbs    Right Hand 3 Point Pinch 17 lbs    Left Hand Grip (lbs) 41    Left Hand Lateral Pinch 16 lbs    Left Hand 3 Point Pinch 8 lbs      Left Hand AROM   L Thumb Radial ADduction/ABduction 0-55 52    L Thumb Palmar  ADduction/ABduction 0-45 55    L Index  MCP 0-90 80 Degrees    L Index PIP 0-100 80 Degrees    L Long  MCP 0-90 80 Degrees    L Long PIP 0-100 95 Degrees    L Ring  MCP 0-90 80 Degrees    L Ring PIP 0-100 95 Degrees    L Little  MCP 0-90 75 Degrees    L Little PIP 0-100 95 Degrees                 Patient  arrived with decrease edema in left wrist and hand as well as decreased pain at rest and at nighttime.  Patient showed excellent progress in active range of motion in digits including thumb as well as wrist in all planes. Pain mostly in composite fist over metacarpals as well as wrist flexion extension as well as radial ulnar deviation.       OT Treatments/Exercises (OP) - 10/19/21 0001       LUE Fluidotherapy   Number Minutes Fluidotherapy 10 Minutes    LUE Fluidotherapy Location Wrist;Hand    Comments 2x cycle of ice 1 min -decrease pain , edema - increase motion               Patient to continue with contrast 2-3 times a day to decrease edema as well as increased motion. Provided patient with new Tubigrip D and E to use in combination with  Isotoner glove depending on pain and comfort to decrease edema Reviewed with patient active assisted range of motion for radial/ ulnar deviation pain-free with glove and Tubigrip on if needed, active range of motion horizontal  and over armrest for wrist flexion; extension with glove on pain-free 10 reps. Thumb palmar /radial abduction active motion pain-free 10 reps Tendon glides active range of motion pain-free free 10 reps Followed by opposition picking up 2 cm foam block fall digits - still tremor to 5th but able to do  10 reps. 2-3 Times a day.       OT Education - 10/19/21 1602     Education Details progress nand changes in HEP    Person(s) Educated Patient    Methods Explanation;Tactile cues;Verbal cues;Handout;Demonstration    Comprehension Verbalized understanding;Returned demonstration;Verbal cues  required;Tactile cues required              OT Short Term Goals - 10/16/21 1318       OT SHORT TERM GOAL #1   Title Patient to be independent in home program to decrease L hand and wrist  edema and pain to have increase left wrist and digit range of motion    Baseline No knowledge of home program to decrease edema and pain-8/10 pain at wrist and digits with range of motion, increase edema at wrist, metacarpals and proximal phalanges    Time 2    Period Weeks    Status New    Target Date 10/30/21               OT Long Term Goals - 10/16/21 1320       OT LONG TERM GOAL #1   Title Left digits active range of motion increased for patient to be able to touch palm to use hand and eating, bathing and dressing with more ease    Baseline L MC's 55- 75 , PIP 's 75 -80 - pain 8/10 with active range of motion-unable to use left hand in ADLs or with severe difficulty    Time 4    Period Weeks    Status New    Target Date 11/13/21      OT LONG TERM GOAL #2   Title Left wrist active range of motion improved for patient to be able to turn doorknob, push and pull heavy door open without pain    Baseline L wrist ext and flexion 55 , UD 25 and RD 18 - pain 8/10 in AROM and use    Time 6    Period Weeks    Status New  Target Date 11/27/21      OT LONG TERM GOAL #3   Title L thumb AROM increase to [facility] to do buttons, and be able to hygiene and play guitar without increase symptoms    Baseline THumb PA and RA 40-45 , pain and tremoring with opposition - only to 2nd and 3rd - - pain 8/10 in thumb with AROM    Time 6    Period Weeks    Status New    Target Date 11/27/21      OT LONG TERM GOAL #4   Title Left grip and prehension strength increased to more than 50% compared to the right to return back to prior level of function and ADLs and IADLs    Baseline R grip 95, L 18 lbs, Lat grip R 20 ,L 11, 3 point pinch R 17 and L 6 lbs    Time 6    Period Weeks    Status New    Target  Date 11/28/21                   Plan - 10/19/21 1604     Clinical Impression Statement Pt refer to OT s/p limited arthroscopic debridement, arthroscopic subacromial decompression, arthroscopic distal clavicle excision, mini open rotator cuff repair, and mini-open biceps tenodesis of his left shoulder on 07/25/21. Pt is about 12 wks s/p.  At eval  pt with increase pain and stiffness in L hand and wrist with increase edema in L  hand and wrist, pain increase to 8/10 with AROM of attempts to make fist or do wrist AROM. Pt show decrease wrist AROM , digits fisting and thumb AROM - with decrease strength liniting his use of L hand in ADL's and IADL's - Unable to use L hand in bathroom hygiene and playing guitar  -patient arrived this date with great progress in swelling as well as active range of motion in wrist and digits as well as pain at rest 0 out of 10 increased to 3-5/10 with active range of motion composite fist and wrist in all planes.- and unable to use L hand. Pt can benefit from skilled OT services to decrease edema and pain p with increase ROM and strength in L hand and wrist to return to prior level of function prior to shoulder surgery- pt lives alone - has aid 3 x wk for 1-2 hrs per pt.    OT Occupational Profile and History Problem Focused Assessment - Including review of records relating to presenting problem    Occupational performance deficits (Please refer to evaluation for details): ADL's;IADL's;Play;Social Participation;Leisure    Body Structure / Function / Physical Skills ADL;Coordination;Flexibility;FMC;IADL;ROM;UE functional use;Edema;Dexterity;Pain;Strength    Rehab Potential Good    Clinical Decision Making Limited treatment options, no task modification necessary    Comorbidities Affecting Occupational Performance: None    Modification or Assistance to Complete Evaluation  No modification of tasks or assist necessary to complete eval    OT Frequency 2x / week    OT  Duration 6 weeks    OT Treatment/Interventions Self-care/ADL training;Paraffin;Moist Heat;Fluidtherapy;Contrast Bath;Therapeutic exercise;Manual Therapy;Patient/family education;Passive range of motion;DME and/or AE instruction;Splinting    Consulted and Agree with Plan of Care Patient             Patient will benefit from skilled therapeutic intervention in order to improve the following deficits and impairments:   Body Structure / Function / Physical Skills: ADL, Coordination, Flexibility, FMC, IADL, ROM, UE functional use, Edema, Dexterity,  Pain, Strength       Visit Diagnosis: Pain in left hand  Pain in left wrist  Stiffness of left hand, not elsewhere classified  Stiffness of left wrist, not elsewhere classified  Localized edema  Muscle weakness (generalized)    Problem List Patient Active Problem List   Diagnosis Date Noted   Ileus ([place]) 04/25/2021   Acid reflux    Emphysema of lung ([facility])    S/P umbilical hernia repair, follow-up exam 04/30/2019   Erythrocytosis 04/17/2019   Positive fecal occult blood test    Polyp of colon    Diverticulosis of large intestine without diverticulitis    CLE (columnar lined esophagus)    DOE (dyspnea on exertion) 07/19/2017   Atypical chest pain 07/19/2017   Deep vein thrombosis (DVT) of popliteal vein of right lower extremity ([place]) 07/19/2017   Benign prostatic hyperplasia 06/25/2017   Essential hypertension 06/25/2017   Esophageal dysphagia 01/31/2017   Epigastric pain 01/31/2017   Obesity, unspecified 06/26/2013    Rosalyn Gess, OTR/L,CLT 10/19/2021, 5:22 PM  [facility] [address] [place], [address] Phone: [phone]   Fax:  [phone]  Name: MONTFORD BARG MRN: [id number] Date of Birth: Aug 15, 1945

## 2021-10-24 ENCOUNTER — Ambulatory Visit: Payer: Medicare Other | Attending: Surgery | Admitting: Occupational Therapy

## 2021-10-24 DIAGNOSIS — M79642 Pain in left hand: Secondary | ICD-10-CM | POA: Diagnosis present

## 2021-10-24 DIAGNOSIS — R600 Localized edema: Secondary | ICD-10-CM | POA: Diagnosis present

## 2021-10-24 DIAGNOSIS — M25532 Pain in left wrist: Secondary | ICD-10-CM | POA: Diagnosis present

## 2021-10-24 DIAGNOSIS — M25642 Stiffness of left hand, not elsewhere classified: Secondary | ICD-10-CM | POA: Diagnosis present

## 2021-10-24 DIAGNOSIS — M25632 Stiffness of left wrist, not elsewhere classified: Secondary | ICD-10-CM | POA: Diagnosis present

## 2021-10-24 DIAGNOSIS — M6281 Muscle weakness (generalized): Secondary | ICD-10-CM | POA: Insufficient documentation

## 2021-10-24 NOTE — Therapy (Signed)
[facility] [address], [place], [address] Phone: [phone]   Fax:  [phone]  Occupational Therapy Treatment  Patient Details  Name: Zachary Sweeney MRN: [id number] Date of Birth: 1945/05/25 Referring Provider (OT): DR Poggi   Encounter Date: 10/24/2021   OT End of Session - 10/24/21 1323     Visit Number 3    Number of Visits 12    Date for OT Re-Evaluation 11/27/21    OT Start Time 2542    OT Stop Time 1356    OT Time Calculation (min) 33 min    Activity Tolerance Patient tolerated treatment well    Behavior During Therapy [facility] for tasks assessed/performed             Past Medical History:  Diagnosis Date   Acid reflux    Arthritis    Back injury    Colon polyp    DVT (deep venous thrombosis) ([place])    a. 06/2017 LE U/S: Right popliteal vein DVT.   Dyspnea on exertion    a. 06/2017 CTA Chest: No PE. No significant coronary Ca2+, centrilobular and paraseptal emphysema;  b.  07/2017 Echo: EF 60-65%, no rwma, Gr1 DD, mildly dil LA. Nl RV fxn; c. 07/2017 MV: Small, severe defect involving apical inf and apical segments - only on rest images consistent w/ artifact. No ischemia or scar.   Emphysema of lung ([place])    a. 06/2017 CTA Chest: Centrilobular and paraseptal emphysema, with mild geographic ground-glass opacity likely representing small airway dzs.   Erythrocytosis 04/17/2019   Hypertension    Sleep apnea    Torn ligament     Past Surgical History:  Procedure Laterality Date   COLONOSCOPY  2008   COLONOSCOPY WITH PROPOFOL N/A 02/27/2017   Procedure: COLONOSCOPY WITH PROPOFOL;  Surgeon: Robert Bellow, MD;  Location: [facility];  Service: Endoscopy;  Laterality: N/A;   COLONOSCOPY WITH PROPOFOL N/A 07/24/2018   Procedure: COLONOSCOPY WITH PROPOFOL;  Surgeon: Virgel Manifold, MD;  Location: [facility];  Service: Endoscopy;  Laterality: N/A;   ESOPHAGOGASTRODUODENOSCOPY (EGD)  WITH PROPOFOL N/A 02/27/2017   Procedure: ESOPHAGOGASTRODUODENOSCOPY (EGD) WITH PROPOFOL;  Surgeon: Robert Bellow, MD;  Location: [facility];  Service: Endoscopy;  Laterality: N/A;   ESOPHAGOGASTRODUODENOSCOPY (EGD) WITH PROPOFOL N/A 07/24/2018   Procedure: ESOPHAGOGASTRODUODENOSCOPY (EGD) WITH PROPOFOL;  Surgeon: Virgel Manifold, MD;  Location: [facility];  Service: Endoscopy;  Laterality: N/A;   HERNIA REPAIR Right    inguinal   INSERTION OF MESH  02/01/2021   Procedure: INSERTION OF MESH;  Surgeon: Herbert Pun, MD;  Location: [facility];  Service: General;;   KNEE SURGERY Left    NECK SURGERY     ROTATOR CUFF REPAIR Right    SHOULDER ARTHROSCOPY WITH SUBACROMIAL DECOMPRESSION, ROTATOR CUFF REPAIR AND BICEP TENDON REPAIR Left 07/25/2021   Procedure: LEFT SHOULDER ARTHROSCOPY WITH DEBRIDEMENT, DECOMPRESSION, DISTAL CLAVICLE EXCISION, BICEPS TENODESIS;  Surgeon: Corky Mull, MD;  Location: [facility];  Service: Orthopedics;  Laterality: Left;   UMBILICAL HERNIA REPAIR N/A 04/22/2019   Procedure: HERNIA REPAIR UMBILICAL ADULT;  Surgeon: Ronny Bacon, MD;  Location: [facility];  Service: General;  Laterality: N/A;    There were no vitals filed for this visit.   Subjective Assessment - 10/24/21 1323     Subjective  I fell 2 days ago - and fell on my hand -my thumb I hurt - it is little better -but my thumb was  my good finger - it is swollen and cannot bend it    Pertinent History Zachary Sweeney is a 76 y.o. male who presents for follow-up with Dr Roland Rack 10/06/21 10 weeks status post a limited arthroscopic debridement, arthroscopic subacromial decompression, arthroscopic distal clavicle excision, mini open rotator cuff repair, and mini-open biceps tenodesis of his left shoulder done 07/25/21. Overall, the patient feels that his shoulder is doing reasonably well. He still notes some soreness in the shoulder for which he will apply ice and take an occasional oxycodone for  discomfort, especially associated with physical therapy. He continues to attend physical therapy, as well as to perform exercises on his own at home and feels that his range of motion and strength continue to improve. He has resumed many of his normal daily activities without difficulty. He does have difficulty sleeping at night, but does not feel that it is due to his shoulder. He denies any recent reinjury to the shoulder, and denies any fevers or chills.    The patient's primary concern today is continued left hand pain and stiffness. He notes that the symptoms have been present since his surgery. They were worse at his last visit, but he notes that the therapist has been working with him and his motion has improved somewhat. However, he still is not yet able to make a fist and has pain with maximal active flexion attempts as well as with passive flexion. He denies any injury to the hand, and denies any numbness or paresthesias to his fingers.  Refer to OT    Patient Stated Goals Want my hand and wrist better the swelling, pain and motion /strength so I can take care of myself - toiletting , gripping things and play the bass    Currently in Pain? Yes    Pain Score 8     Pain Location --   thumb   Pain Orientation Left    Pain Descriptors / Indicators Tender;Aching;Throbbing    Pain Type Acute pain    Pain Onset More than a month ago    Pain Frequency Constant                [facility] OT Assessment - 10/24/21 0001       AROM   Left Wrist Extension 73 Degrees    Left Wrist Flexion 78 Degrees    Left Wrist Radial Deviation 20 Degrees    Left Wrist Ulnar Deviation 30 Degrees      Left Hand AROM   L Thumb MCP 0-60 30 Degrees    L Thumb IP 0-80 30 Degrees    L Thumb Radial ADduction/ABduction 0-55 60    L Thumb Palmar ADduction/ABduction 0-45 50    L Thumb Opposition to Index --   opposition to 2nd -but pain at IP   L Index  MCP 0-90 75 Degrees    L Index PIP 0-100 90 Degrees    L Long   MCP 0-90 75 Degrees    L Long PIP 0-100 90 Degrees    L Ring  MCP 0-90 80 Degrees    L Ring PIP 0-100 90 Degrees    L Little  MCP 0-90 65 Degrees    L Little PIP 0-100 95 Degrees               Patient arrived this date with reports of falling 2 days ago.  Was reaching for his cell phone and lost his balance. Reports falling on his hand and  hurting his thumb.  Patient reports feels better than it was. But continues today with increased swelling at the thumb with mostly tenderness and pain at thumb MC and IP on the left.   Patient to see PT over [facility] on Thursday.  If continues to have swelling and pain to ask if he can have a x-ray.     OT Treatments/Exercises (OP) - 10/24/21 0001       LUE Contrast Bath   Time 8 minutes    Comments prior to ROM , decrease pain and edema              Patient to continue with contrast 2-3 times a day to decrease edema as well as increased motion. Provided patient with new Tubigrip D and E to use in combination with  Isotoner glove depending on pain and comfort to decrease edema Reviewed with patient active assisted range of motion for radial/ ulnar deviation and  wrist flexion; extension with glove on or off -tolerates this date active assisted range of motion pain under 2/10 10 reps. Thumb palmar /radial abduction active motion pain-free 10 reps Tendon glides active range of motion pain-free free 10 reps Patient could do this today placed on hold composite flexion to palm pain-free and digits. Patient to hold off on any thumb flexion and opposition because of increased edema and pain after fall in thumb MC and IP.   2-3 Times a day.          OT Education - 10/24/21 1323     Education Details progress nand changes in HEP    Person(s) Educated Patient    Methods Explanation;Tactile cues;Verbal cues;Handout;Demonstration    Comprehension Verbalized understanding;Returned demonstration;Verbal cues required;Tactile cues  required              OT Short Term Goals - 10/16/21 1318       OT SHORT TERM GOAL #1   Title Patient to be independent in home program to decrease L hand and wrist  edema and pain to have increase left wrist and digit range of motion    Baseline No knowledge of home program to decrease edema and pain-8/10 pain at wrist and digits with range of motion, increase edema at wrist, metacarpals and proximal phalanges    Time 2    Period Weeks    Status New    Target Date 10/30/21               OT Long Term Goals - 10/16/21 1320       OT LONG TERM GOAL #1   Title Left digits active range of motion increased for patient to be able to touch palm to use hand and eating, bathing and dressing with more ease    Baseline L MC's 55- 75 , PIP 's 75 -80 - pain 8/10 with active range of motion-unable to use left hand in ADLs or with severe difficulty    Time 4    Period Weeks    Status New    Target Date 11/13/21      OT LONG TERM GOAL #2   Title Left wrist active range of motion improved for patient to be able to turn doorknob, push and pull heavy door open without pain    Baseline L wrist ext and flexion 55 , UD 25 and RD 18 - pain 8/10 in AROM and use    Time 6    Period Weeks    Status New  Target Date 11/27/21      OT LONG TERM GOAL #3   Title L thumb AROM increase to [facility] to do buttons, and be able to hygiene and play guitar without increase symptoms    Baseline THumb PA and RA 40-45 , pain and tremoring with opposition - only to 2nd and 3rd - - pain 8/10 in thumb with AROM    Time 6    Period Weeks    Status New    Target Date 11/27/21      OT LONG TERM GOAL #4   Title Left grip and prehension strength increased to more than 50% compared to the right to return back to prior level of function and ADLs and IADLs    Baseline R grip 95, L 18 lbs, Lat grip R 20 ,L 11, 3 point pinch R 17 and L 6 lbs    Time 6    Period Weeks    Status New    Target Date 11/28/21                    Plan - 10/24/21 1323     Clinical Impression Statement Pt refer to OT s/p limited arthroscopic debridement, arthroscopic subacromial decompression, arthroscopic distal clavicle excision, mini open rotator cuff repair, and mini-open biceps tenodesis of his left shoulder on 07/25/21. Pt is about 12 wks s/p.  At eval  pt with increase pain and stiffness in L hand and wrist with increase edema in L  hand and wrist, pain increase to 8/10 with AROM of attempts to make fist or do wrist AROM. Pt show at eval decrease wrist AROM , digits fisting and thumb AROM - with decrease strength liniting his use of L hand in ADL's and IADL's - Unable to use L hand in bathroom hygiene and playing guitar  -patient showed great progress last time in grip and prehension strength-  arrive last 2 sessions  great progress in swelling as well as active range of motion in wrist and digits as well as pain at rest 0 out of 10 in wrist and digits- BUT pt fell 2 days ago on hand - and had increase swelling and pain inL  thumb from [facility] to IP - pt report pain is better -but increase wtih touch and AROM attempts to 8/10 -  told pt when he has appt with PT at [facility] Thursday and has cont  pain - ask if Dr Roland Rack or Mia Creek PA can have xray done on thumb -  Pt can benefit from skilled OT services to decrease edema and pain p with increase ROM and strength in L hand and wrist to return to prior level of function prior to shoulder surgery- pt lives alone - has aid 3 x wk for 1-2 hrs per pt.    OT Occupational Profile and History Problem Focused Assessment - Including review of records relating to presenting problem    Occupational performance deficits (Please refer to evaluation for details): ADL's;IADL's;Play;Social Participation;Leisure    Body Structure / Function / Physical Skills ADL;Coordination;Flexibility;FMC;IADL;ROM;UE functional use;Edema;Dexterity;Pain;Strength    Rehab Potential Good    Clinical Decision Making  Limited treatment options, no task modification necessary    Comorbidities Affecting Occupational Performance: None    Modification or Assistance to Complete Evaluation  No modification of tasks or assist necessary to complete eval    OT Frequency 2x / week    OT Duration 6 weeks    OT Treatment/Interventions Self-care/ADL training;Paraffin;Moist Heat;Fluidtherapy;Contrast  Bath;Therapeutic exercise;Manual Therapy;Patient/family education;Passive range of motion;DME and/or AE instruction;Splinting    Consulted and Agree with Plan of Care Patient             Patient will benefit from skilled therapeutic intervention in order to improve the following deficits and impairments:   Body Structure / Function / Physical Skills: ADL, Coordination, Flexibility, FMC, IADL, ROM, UE functional use, Edema, Dexterity, Pain, Strength       Visit Diagnosis: Pain in left hand  Pain in left wrist  Stiffness of left hand, not elsewhere classified  Stiffness of left wrist, not elsewhere classified  Localized edema  Muscle weakness (generalized)    Problem List Patient Active Problem List   Diagnosis Date Noted   Ileus ([place]) 04/25/2021   Acid reflux    Emphysema of lung ([facility])    S/P umbilical hernia repair, follow-up exam 04/30/2019   Erythrocytosis 04/17/2019   Positive fecal occult blood test    Polyp of colon    Diverticulosis of large intestine without diverticulitis    CLE (columnar lined esophagus)    DOE (dyspnea on exertion) 07/19/2017   Atypical chest pain 07/19/2017   Deep vein thrombosis (DVT) of popliteal vein of right lower extremity ([place]) 07/19/2017   Benign prostatic hyperplasia 06/25/2017   Essential hypertension 06/25/2017   Esophageal dysphagia 01/31/2017   Epigastric pain 01/31/2017   Obesity, unspecified 06/26/2013    Rosalyn Gess, OTR/L,CLT 10/24/2021, 2:00 PM  [facility] [address] [place], [address] Phone: [phone]   Fax:  [phone]  Name: Zachary Sweeney MRN: [id number] Date of Birth: 1945-11-22

## 2021-10-26 ENCOUNTER — Ambulatory Visit: Payer: Medicare Other | Admitting: Occupational Therapy

## 2021-12-11 ENCOUNTER — Other Ambulatory Visit: Payer: Self-pay | Admitting: Oncology

## 2021-12-11 NOTE — Telephone Encounter (Signed)
pt has been scheduled and notified

## 2021-12-14 ENCOUNTER — Inpatient Hospital Stay: Payer: Medicare Other

## 2021-12-14 ENCOUNTER — Inpatient Hospital Stay: Payer: Medicare Other | Attending: Oncology | Admitting: Oncology

## 2021-12-14 ENCOUNTER — Encounter: Payer: Self-pay | Admitting: Oncology

## 2021-12-14 VITALS — BP 138/93 | HR 69 | Temp 96.9°F | Wt 302.5 lb

## 2021-12-14 DIAGNOSIS — R600 Localized edema: Secondary | ICD-10-CM | POA: Diagnosis not present

## 2021-12-14 DIAGNOSIS — I872 Venous insufficiency (chronic) (peripheral): Secondary | ICD-10-CM | POA: Diagnosis not present

## 2021-12-14 DIAGNOSIS — Z86718 Personal history of other venous thrombosis and embolism: Secondary | ICD-10-CM | POA: Insufficient documentation

## 2021-12-14 DIAGNOSIS — Z7901 Long term (current) use of anticoagulants: Secondary | ICD-10-CM | POA: Diagnosis not present

## 2021-12-14 DIAGNOSIS — D751 Secondary polycythemia: Secondary | ICD-10-CM | POA: Diagnosis present

## 2021-12-14 LAB — COMPREHENSIVE METABOLIC PANEL
ALT: 19 U/L (ref 0–44)
AST: 24 U/L (ref 15–41)
Albumin: 4.1 g/dL (ref 3.5–5.0)
Alkaline Phosphatase: 52 U/L (ref 38–126)
Anion gap: 7 (ref 5–15)
BUN: 11 mg/dL (ref 8–23)
CO2: 25 mmol/L (ref 22–32)
Calcium: 9.3 mg/dL (ref 8.9–10.3)
Chloride: 106 mmol/L (ref 98–111)
Creatinine, Ser: 1.18 mg/dL (ref 0.61–1.24)
GFR, Estimated: >60 mL/min (ref >60)
Glucose, Bld: 107 mg/dL — ABNORMAL HIGH (ref 70–99)
Potassium: 3.9 mmol/L (ref 3.5–5.1)
Sodium: 138 mmol/L (ref 135–145)
Total Bilirubin: 0.7 mg/dL (ref 0.3–1.2)
Total Protein: 7.7 g/dL (ref 6.5–8.1)

## 2021-12-14 LAB — CBC WITH DIFFERENTIAL/PLATELET
Abs Immature Granulocytes: 0.03 10*3/uL (ref 0.00–0.07)
Basophils Absolute: 0 10*3/uL (ref 0.0–0.1)
Basophils Relative: 1 %
Eosinophils Absolute: 0.2 10*3/uL (ref 0.0–0.5)
Eosinophils Relative: 2 %
HCT: 50.5 % (ref 39.0–52.0)
Hemoglobin: 17.6 g/dL — ABNORMAL HIGH (ref 13.0–17.0)
Immature Granulocytes: 0 %
Lymphocytes Relative: 34 %
Lymphs Abs: 2.6 10*3/uL (ref 0.7–4.0)
MCH: 30.5 pg (ref 26.0–34.0)
MCHC: 34.9 g/dL (ref 30.0–36.0)
MCV: 87.5 fL (ref 80.0–100.0)
Monocytes Absolute: 0.5 10*3/uL (ref 0.1–1.0)
Monocytes Relative: 7 %
Neutro Abs: 4.3 10*3/uL (ref 1.7–7.7)
Neutrophils Relative %: 56 %
Platelets: 159 10*3/uL (ref 150–400)
RBC: 5.77 MIL/uL (ref 4.22–5.81)
RDW: 13.6 % (ref 11.5–15.5)
WBC: 7.6 10*3/uL (ref 4.0–10.5)
nRBC: 0 % (ref 0.0–0.2)

## 2021-12-14 MED ORDER — APIXABAN 2.5 MG PO TABS: 2.5000 mg | ORAL_TABLET | Freq: Two times a day (BID) | ORAL | 1 refills | Status: DC

## 2021-12-14 NOTE — Assessment & Plan Note (Signed)
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  Encourage patient to use CPAP machine

## 2021-12-14 NOTE — Progress Notes (Signed)
Hematology/Oncology Progress note [phone] [phone]      Patient Care Team: Theotis Burrow, MD as PCP - General (Family Medicine) End, Harrell Gave, MD as PCP - Cardiology (Cardiology)   ASSESSMENT & PLAN:   History of DVT of lower extremity #History of recurrent DVT. Continue Eliquis 2.5 mg twice daily for maintenance.  He tolerates well.  Prescription refilled. Chronic lower extremity edema secondary to venous insufficiency.  Continue compression stocking.  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  Encourage patient to use CPAP machine  Orders Placed This Encounter  Procedures   CBC with Differential/Platelet    Standing Status:   Future    Standing Expiration Date:   12/15/2022   Comprehensive metabolic panel    Standing Status:   Future    Standing Expiration Date:   12/14/2022   CBC with Differential/Platelet    Standing Status:   Future    Number of Occurrences:   1    Standing Expiration Date:   12/15/2022   Comprehensive metabolic panel    Standing Status:   Future    Number of Occurrences:   1    Standing Expiration Date:   12/14/2022   Follow up in 6 months.   All questions were answered. The patient knows to call the clinic with any problems, questions or concerns.  Zachary Server, MD, PhD [facility] Hematology Oncology 12/14/2021   CHIEF COMPLAINTS/REASON FOR VISIT:  Follow-up for DVT  HISTORY OF PRESENTING ILLNESS:  Zachary [facility] is a  76 y.o.  male presents for DVT, chronic anticoagulation.  Patient recently established care with gastroenterology for consultation and management of chronic history of diffuse abdominal pain as well as epigastric pain.  Not related to meals, exacerbated when he bends down.  Not associated with nausea or vomiting. Denies any weight loss, fever, chills, night sweats.  Appetite is fair. Denies hematochezia, hematuria, hematemesis, epistaxis, black tarry stool or easy  bruising.  Endoscopy procedures were recommended for further evaluation.  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  Patient was referred by gastroenterology to me for further evaluation and peri-procedure anticoagulation recommendation. Patient also was found to have an abdominal wall hernia which caused this pain with bending down.  He was also referred to see surgery Dr. Hampton Abbot for further evaluation.  06/25/2017, patient had US venous bilateral lower extremity which showed a focal area of partial compressibility popliteal Vein on the right, compatible with a partially occlusive DVT.  The borders are smooth and this has a subacute appearance. Left lower extremity no DVT.   Patient recalls that he had right foot pain and swelling, as the initial presentation which prompted ultrasound evaluation. Patient was started on Eliquis after ED discussed his case with vascular surgery Dr. Lucky Cowboy. Patient was taken off Eliquis in November 2019. Presented to [facility] emergency room on 03/30/2018 complaining right lower extremity pain and swelling again which is similar to when he had a blood clot.  Patient was concerned that he could have developed blood clots again so he restarted taking Eliquis for the past week prior to presenting to the emergency room.  He mentions to a friend at the church and they urged him to go to emergency room. In the ED, ultrasound showed nonocclusive right lower extremity DVT. 04/10/2018, he had CT abdomen pelvis done for evaluation of chronic abdominal pain.  CT showed no acute findings in the abdomen and pelvis.  Nonobstructive punctate stones over the lower  pole collecting system of the right kidney.  Small umbilical hernia containing only peritoneal fat.  Aortic atherosclerosis.  Patient reports feeling well today.  Right lower extremity pain and swelling has improved.  He is taking Eliquis 5 mg twice daily since February 2020.  Tolerates well.  No  bleeding events.  #  GI work-up done on 07/24/2018. Colonoscopy showed colon polyps which were resected and retrieved.  Diverticulosis.  Biopsy showed tubular adenoma.  Recommend repeat colonoscopy in 3 years negative for high-grade dysplasia and malignancy. EGD showed salmon-colored mucosa suggestive of Barrett's esophagus.  Biopsied.  Per GI note, biopsy showed evidence of acid reflux.  Patient was started on Pepcid.  #  07/10/2018 US venous lower extremity unilateral right findings compatible with resolving isolated right popliteal DVT. Recommend patient to be switched to Eliquis 2.5 mg twice daily for long-term anticoagulation maintenance.  # sleep Apnea INTERVAL HISTORY Zachary [facility] is a 76 y.o. male who has above history reviewed by me today presents for follow up visit for DVT and erythrocytosis.  Patient was last seen 1 year ago and presents to establish care. Problems and complaints are listed below: He takes eliquis 2.'5mg'$  BID.  Patient denies any bleeding events Continues to have intermittent right lower extremity swelling which is chronic. Recently patient has had left thumb fracture    Review of Systems  Constitutional:  Negative for appetite change, chills, fatigue, fever and unexpected weight change.  HENT:   Negative for hearing loss and voice change.   Eyes:  Negative for eye problems and icterus.  Respiratory:  Negative for chest tightness, cough and shortness of breath.   Cardiovascular:  Positive for leg swelling. Negative for chest pain.  Gastrointestinal:  Negative for abdominal distention, abdominal pain, nausea and vomiting.  Endocrine: Negative for hot flashes.  Genitourinary:  Negative for difficulty urinating, dysuria and frequency.   Musculoskeletal:  Negative for arthralgias.  Skin:  Negative for itching and rash.  Neurological:  Negative for light-headedness and numbness.  Hematological:  Negative for adenopathy. Does not bruise/bleed easily.   Psychiatric/Behavioral:  Negative for confusion.     MEDICAL HISTORY:  Past Medical History:  Diagnosis Date   Acid reflux    Arthritis    Back injury    Colon polyp    DVT (deep venous thrombosis) ([place])    a. 06/2017 LE U/S: Right popliteal vein DVT.   Dyspnea on exertion    a. 06/2017 CTA Chest: No PE. No significant coronary Ca2+, centrilobular and paraseptal emphysema;  b.  07/2017 Echo: EF 60-65%, no rwma, Gr1 DD, mildly dil LA. Nl RV fxn; c. 07/2017 MV: Small, severe defect involving apical inf and apical segments - only on rest images consistent w/ artifact. No ischemia or scar.   Emphysema of lung ([place])    a. 06/2017 CTA Chest: Centrilobular and paraseptal emphysema, with mild geographic ground-glass opacity likely representing small airway dzs.   Erythrocytosis 04/17/2019   Hypertension    Sleep apnea    Torn ligament     SURGICAL HISTORY: Past Surgical History:  Procedure Laterality Date   COLONOSCOPY  2008   COLONOSCOPY WITH PROPOFOL N/A 02/27/2017   Procedure: COLONOSCOPY WITH PROPOFOL;  Surgeon: Robert Bellow, MD;  Location: [facility] ENDOSCOPY;  Service: Endoscopy;  Laterality: N/A;   COLONOSCOPY WITH PROPOFOL N/A 07/24/2018   Procedure: COLONOSCOPY WITH PROPOFOL;  Surgeon: Virgel Manifold, MD;  Location: [facility] ENDOSCOPY;  Service: Endoscopy;  Laterality: N/A;   ESOPHAGOGASTRODUODENOSCOPY (EGD) WITH PROPOFOL  N/A 02/27/2017   Procedure: ESOPHAGOGASTRODUODENOSCOPY (EGD) WITH PROPOFOL;  Surgeon: Robert Bellow, MD;  Location: [facility] ENDOSCOPY;  Service: Endoscopy;  Laterality: N/A;   ESOPHAGOGASTRODUODENOSCOPY (EGD) WITH PROPOFOL N/A 07/24/2018   Procedure: ESOPHAGOGASTRODUODENOSCOPY (EGD) WITH PROPOFOL;  Surgeon: Virgel Manifold, MD;  Location: [facility] ENDOSCOPY;  Service: Endoscopy;  Laterality: N/A;   HERNIA REPAIR Right    inguinal   INSERTION OF MESH  02/01/2021   Procedure: INSERTION OF MESH;  Surgeon: Herbert Pun, MD;  Location: [facility] ORS;  Service:  General;;   KNEE SURGERY Left    NECK SURGERY     ROTATOR CUFF REPAIR Right    SHOULDER ARTHROSCOPY WITH SUBACROMIAL DECOMPRESSION, ROTATOR CUFF REPAIR AND BICEP TENDON REPAIR Left 07/25/2021   Procedure: LEFT SHOULDER ARTHROSCOPY WITH DEBRIDEMENT, DECOMPRESSION, DISTAL CLAVICLE EXCISION, BICEPS TENODESIS;  Surgeon: Corky Mull, MD;  Location: [facility] ORS;  Service: Orthopedics;  Laterality: Left;   UMBILICAL HERNIA REPAIR N/A 04/22/2019   Procedure: HERNIA REPAIR UMBILICAL ADULT;  Surgeon: Ronny Bacon, MD;  Location: [facility] ORS;  Service: General;  Laterality: N/A;    SOCIAL HISTORY: Social History   Socioeconomic History   Marital status: Single    Spouse name: Not on file   Number of children: Not on file   Years of education: Not on file   Highest education level: Not on file  Occupational History   Occupation: retired  Tobacco Use   Smoking status: Former    Packs/day: 0.25    Years: 30.00    Total pack years: 7.50    Types: Cigarettes    Quit date: 02/19/1994    Years since quitting: 27.8   Smokeless tobacco: Never  Vaping Use   Vaping Use: Never used  Substance and Sexual Activity   Alcohol use: No   Drug use: No   Sexual activity: Not on file  Other Topics Concern   Not on file  Social History Narrative   Lives alone   Social Determinants of Health   Financial Resource Strain: Not on file  Food Insecurity: Not on file  Transportation Needs: Not on file  Physical Activity: Not on file  Stress: Not on file  Social Connections: Not on file  Intimate Partner Violence: Not on file    FAMILY HISTORY: Family History  Problem Relation Age of Onset   Lung cancer Mother    Heart disease Father    Prostate cancer Maternal Uncle     ALLERGIES:  has No Known Allergies.  MEDICATIONS:  Current Outpatient Medications  Medication Sig Dispense Refill   acetaminophen (TYLENOL) 500 MG tablet Take 1,000 mg by mouth every 6 (six) hours as needed for fever.      diltiazem (CARDIZEM CD) 300 MG 24 hr capsule Take 1 capsule (300 mg total) by mouth daily. 90 capsule 3   fluocinonide (LIDEX) 0.05 % external solution Apply 1 application topically daily.     ketoconazole (NIZORAL) 2 % cream Apply topically daily.     losartan (COZAAR) 50 MG tablet Take 50 mg by mouth daily.     metoprolol succinate (TOPROL-XL) 50 MG 24 hr tablet      omeprazole (PRILOSEC) 20 MG capsule Take 1 capsule (20 mg total) by mouth daily.     umeclidinium-vilanterol (ANORO ELLIPTA) 62.5-25 MCG/INH AEPB Inhale 1 puff into the lungs daily.     VIAGRA 50 MG tablet SMARTSIG:1 Tablet(s) By Mouth     albuterol (VENTOLIN HFA) 108 (90 Base) MCG/ACT inhaler 2 puffs every 4 (four)  hours as needed for shortness of breath or wheezing. (Patient not taking: Reported on 12/14/2021)     apixaban (ELIQUIS) 2.5 MG TABS tablet Take 1 tablet (2.5 mg total) by mouth 2 (two) times daily. 180 tablet 1   losartan (COZAAR) 25 MG tablet Take 25 mg by mouth daily. (Patient not taking: Reported on 12/14/2021)     omeprazole (PRILOSEC) 20 MG capsule TAKE 1 CAPSULE BY MOUTH EVERY DAY FOR REFLUX (Patient not taking: Reported on 12/14/2021)     oxyCODONE (ROXICODONE) 5 MG immediate release tablet Take 1-2 tablets (5-10 mg total) by mouth every 4 (four) hours as needed for severe pain or moderate pain. (Patient not taking: Reported on 12/14/2021) 40 tablet 0   simethicone (MYLICON) 80 MG chewable tablet Chew 1 tablet (80 mg total) by mouth 4 (four) times daily as needed (abdominal bloating or gas pains). (Patient not taking: Reported on 12/14/2021) 30 tablet 0   No current facility-administered medications for this visit.     PHYSICAL EXAMINATION:  Vitals:   12/14/21 0946  BP: (!) 138/93  Pulse: 69  Temp: (!) 96.9 F (36.1 C)  SpO2: 99%   Filed Weights   12/14/21 0946  Weight: (!) 302 lb 8 oz (137.2 kg)    Physical Exam Constitutional:      General: He is not in acute distress.    Appearance: He is  obese.  HENT:     Head: Normocephalic and atraumatic.  Eyes:     General: No scleral icterus.    Pupils: Pupils are equal, round, and reactive to light.  Cardiovascular:     Rate and Rhythm: Normal rate and regular rhythm.     Heart sounds: Normal heart sounds.  Pulmonary:     Effort: Pulmonary effort is normal. No respiratory distress.     Breath sounds: No wheezing.  Abdominal:     General: Bowel sounds are normal. There is no distension.     Palpations: Abdomen is soft. There is no mass.     Tenderness: There is no abdominal tenderness.  Musculoskeletal:        General: No deformity. Normal range of motion.     Cervical back: Normal range of motion and neck supple.     Comments: Trace bilateral lower extremity edema.   Skin:    General: Skin is warm and dry.     Findings: No erythema or rash.  Neurological:     Mental Status: He is alert and oriented to person, place, and time. Mental status is at baseline.     Cranial Nerves: No cranial nerve deficit.     Coordination: Coordination normal.  Psychiatric:        Mood and Affect: Mood normal.        Behavior: Behavior normal.        Thought Content: Thought content normal.     RADIOGRAPHIC STUDIES: I have personally reviewed the radiological images as listed and agreed with the findings in the report. No results found.     LABORATORY DATA:  I have reviewed the data as listed    Latest Ref Rng & Units 12/14/2021   10:30 AM 05/01/2021    4:10 AM 04/30/2021    5:48 AM  CBC  WBC 4.0 - 10.5 K/uL 7.6  6.4  5.4   Hemoglobin 13.0 - 17.0 g/dL 17.6  17.0  16.8   Hematocrit 39.0 - 52.0 % 50.5  50.4  49.0   Platelets 150 - 400 K/uL 159  154  135       Latest Ref Rng & Units 12/14/2021   10:30 AM 04/30/2021    5:48 AM 04/29/2021    4:08 AM  CMP  Glucose 70 - 99 mg/dL 107  97  92   BUN 8 - 23 mg/dL '11  8  10   '$ Creatinine 0.61 - 1.24 mg/dL 1.18  1.19  1.12   Sodium 135 - 145 mmol/L 138  136  136   Potassium 3.5 - 5.1  mmol/L 3.9  3.4  3.7   Chloride 98 - 111 mmol/L 106  103  102   CO2 22 - 32 mmol/L '25  27  26   '$ Calcium 8.9 - 10.3 mg/dL 9.3  9.3  9.0   Total Protein 6.5 - 8.1 g/dL 7.7     Total Bilirubin 0.3 - 1.2 mg/dL 0.7     Alkaline Phos 38 - 126 U/L 52     AST 15 - 41 U/L 24     ALT 0 - 44 U/L 19

## 2021-12-14 NOTE — Assessment & Plan Note (Signed)
#  History of recurrent DVT. Continue Eliquis 2.5 mg twice daily for maintenance.  He tolerates well.  Prescription refilled. Chronic lower extremity edema secondary to venous insufficiency.  Continue compression stocking.

## 2022-01-09 ENCOUNTER — Ambulatory Visit: Payer: Medicare Other | Attending: Surgery | Admitting: Occupational Therapy

## 2022-01-09 DIAGNOSIS — M25632 Stiffness of left wrist, not elsewhere classified: Secondary | ICD-10-CM | POA: Insufficient documentation

## 2022-01-09 DIAGNOSIS — R600 Localized edema: Secondary | ICD-10-CM | POA: Insufficient documentation

## 2022-01-09 DIAGNOSIS — M25642 Stiffness of left hand, not elsewhere classified: Secondary | ICD-10-CM | POA: Insufficient documentation

## 2022-01-09 DIAGNOSIS — M25532 Pain in left wrist: Secondary | ICD-10-CM | POA: Insufficient documentation

## 2022-01-09 DIAGNOSIS — M79642 Pain in left hand: Secondary | ICD-10-CM | POA: Insufficient documentation

## 2022-01-09 DIAGNOSIS — M6281 Muscle weakness (generalized): Secondary | ICD-10-CM | POA: Insufficient documentation

## 2022-01-09 NOTE — Therapy (Signed)
[facility] [address] [place], [address] Phone: [phone]   Fax:  [phone]  Occupational Therapy Treatment  Patient Details  Name: Zachary Sweeney MRN: [id number] Date of Birth: [birth year] Referring Provider (OT): DR Poggi   Encounter Date: 01/09/2022   OT End of Session - 01/09/22 1340     Visit Number 4    Number of Visits 12    Date for OT Re-Evaluation 02/20/22    OT Start Time 1322    OT Stop Time 1400    OT Time Calculation (min) 38 min    Activity Tolerance Patient tolerated treatment well    Behavior During Therapy [facility] for tasks assessed/performed             Past Medical History:  Diagnosis Date   Acid reflux    Arthritis    Back injury    Colon polyp    DVT (deep venous thrombosis) ([place])    a. 06/2017 LE U/S: Right popliteal vein DVT.   Dyspnea on exertion    a. 06/2017 CTA Chest: No PE. No significant coronary Ca2+, centrilobular and paraseptal emphysema;  b.  07/2017 Echo: EF 60-65%, no rwma, Gr1 DD, mildly dil LA. Nl RV fxn; c. 07/2017 MV: Small, severe defect involving apical inf and apical segments - only on rest images consistent w/ artifact. No ischemia or scar.   Emphysema of lung ([place])    a. 06/2017 CTA Chest: Centrilobular and paraseptal emphysema, with mild geographic ground-glass opacity likely representing small airway dzs.   Erythrocytosis 04/17/2019   Hypertension    Sleep apnea    Torn ligament     Past Surgical History:  Procedure Laterality Date   COLONOSCOPY  2008   COLONOSCOPY WITH PROPOFOL N/A 02/27/2017   Procedure: COLONOSCOPY WITH PROPOFOL;  Surgeon: Robert Bellow, MD;  Location: [facility];  Service: Endoscopy;  Laterality: N/A;   COLONOSCOPY WITH PROPOFOL N/A 07/24/2018   Procedure: COLONOSCOPY WITH PROPOFOL;  Surgeon: Virgel Manifold, MD;  Location: [facility];  Service: Endoscopy;  Laterality: N/A;   ESOPHAGOGASTRODUODENOSCOPY (EGD)  WITH PROPOFOL N/A 02/27/2017   Procedure: ESOPHAGOGASTRODUODENOSCOPY (EGD) WITH PROPOFOL;  Surgeon: Robert Bellow, MD;  Location: [facility];  Service: Endoscopy;  Laterality: N/A;   ESOPHAGOGASTRODUODENOSCOPY (EGD) WITH PROPOFOL N/A 07/24/2018   Procedure: ESOPHAGOGASTRODUODENOSCOPY (EGD) WITH PROPOFOL;  Surgeon: Virgel Manifold, MD;  Location: [facility];  Service: Endoscopy;  Laterality: N/A;   HERNIA REPAIR Right    inguinal   INSERTION OF MESH  02/01/2021   Procedure: INSERTION OF MESH;  Surgeon: Herbert Pun, MD;  Location: [facility] ORS;  Service: General;;   KNEE SURGERY Left    NECK SURGERY     ROTATOR CUFF REPAIR Right    SHOULDER ARTHROSCOPY WITH SUBACROMIAL DECOMPRESSION, ROTATOR CUFF REPAIR AND BICEP TENDON REPAIR Left 07/25/2021   Procedure: LEFT SHOULDER ARTHROSCOPY WITH DEBRIDEMENT, DECOMPRESSION, DISTAL CLAVICLE EXCISION, BICEPS TENODESIS;  Surgeon: Corky Mull, MD;  Location: [facility] ORS;  Service: Orthopedics;  Laterality: Left;   UMBILICAL HERNIA REPAIR N/A 04/22/2019   Procedure: HERNIA REPAIR UMBILICAL ADULT;  Surgeon: Ronny Bacon, MD;  Location: [facility] ORS;  Service: General;  Laterality: N/A;    There were no vitals filed for this visit.   Subjective Assessment - 01/09/22 1332     Subjective  My thumb is healed -but then I had some depression but I am better now and started back my shoulder therapy and back with  you - my hand and thumb is weak and shaking- cannot use it and want to paly the bass again    Pertinent History [name] is a 76 y.o. male who presents for follow-up with Dr Roland Rack 10/06/21 10 weeks status post a limited arthroscopic debridement, arthroscopic subacromial decompression, arthroscopic distal clavicle excision, mini open rotator cuff repair, and mini-open biceps tenodesis of his left shoulder done 07/25/21. Overall, the patient feels that his shoulder is doing reasonably well. He still notes some soreness in the shoulder for which  he will apply ice and take an occasional oxycodone for discomfort, especially associated with physical therapy. He continues to attend physical therapy, as well as to perform exercises on his own at home and feels that his range of motion and strength continue to improve. He has resumed many of his normal daily activities without difficulty. He does have difficulty sleeping at night, but does not feel that it is due to his shoulder. He denies any recent reinjury to the shoulder, and denies any fevers or chills.    The patient's primary concern today is continued left hand pain and stiffness. He notes that the symptoms have been present since his surgery. They were worse at his last visit, but he notes that the therapist has been working with him and his motion has improved somewhat. However, he still is not yet able to make a fist and has pain with maximal active flexion attempts as well as with passive flexion. He denies any injury to the hand, and denies any numbness or paresthesias to his fingers. refer to OT but then had fx of thumb early Sept - immobilize -refer back to OT    Patient Stated Goals Want my hand and thumb better the swelling, pain and motion /strength so I can take care of myself - toiletting , gripping things and play the bass    Currently in Pain? Yes    Pain Score 8     Pain Location --   thumb   Pain Orientation Left    Pain Descriptors / Indicators Aching;Shooting;Tightness    Pain Type Acute pain    Pain Onset More than a month ago    Pain Frequency Intermittent                [facility] OT Assessment - 01/09/22 0001       AROM   Left Wrist Extension 70 Degrees    Left Wrist Flexion 65 Degrees    Left Wrist Radial Deviation 30 Degrees    Left Wrist Ulnar Deviation 35 Degrees      Left Hand AROM   L Thumb MCP 0-60 45 Degrees    L Thumb IP 0-80 30 Degrees    L Thumb Radial ADduction/ABduction 0-55 55    L Thumb Palmar ADduction/ABduction 0-45 60    L Thumb Opposition  to Index --   opposition to 5th   L Index  MCP 0-90 65 Degrees    L Index PIP 0-100 90 Degrees    L Long  MCP 0-90 65 Degrees    L Long PIP 0-100 90 Degrees    L Ring  MCP 0-90 70 Degrees    L Ring PIP 0-100 95 Degrees    L Little  MCP 0-90 70 Degrees    L Little PIP 0-100 95 Degrees                      OT Treatments/Exercises (OP) -  01/09/22 0001       LUE Fluidotherapy   Number Minutes Fluidotherapy 8 Minutes    LUE Fluidotherapy Location Hand    Comments 2 cycles of ice 1 min increase motion and decrease pain             REview with pt AROM after doing contrast at home New isotoner glove for day and night time to decrease edema  In L hand  AROM for PA and RA of thumb  Blocked AROM for IP and MC of thumb  Prior to picking up 2cm foam block alternate digits- 8 reps Keep pain under 2/10  Tendon glides - pt to block hand and wrist to decrease tremoring 10 reps  AROM wrist flexoin, extention  HEP 2-3 x day        OT Education - 01/09/22 1340     Education Details findings of reeval - HEP    Person(s) Educated Patient    Methods Explanation;Tactile cues;Verbal cues;Handout;Demonstration    Comprehension Verbalized understanding;Returned demonstration;Verbal cues required;Tactile cues required              OT Short Term Goals - 01/09/22 2013       OT SHORT TERM GOAL #1   Title Patient to be independent in home program to decrease L hand and wrist  edema and pain to have increase left wrist and digit range of motion    Baseline Pt return this date after having thumb close non displace fx early Sept - need review again with HEP- show increase edema, pain and decrease ROM again    Time 3    Period Weeks    Status On-going    Target Date 01/30/22               OT Long Term Goals - 01/09/22 2019       OT LONG TERM GOAL #1   Title Left digits active range of motion increased for patient to be able to touch palm to use hand and eating,  bathing and dressing with more ease    Baseline decrease Flexion in all digits and thumb -shaking of digits and thumb when attempt to do ROM or use - pain increase in thumb 8/10    Time 5    Period Weeks    Status On-going    Target Date 02/13/22      OT LONG TERM GOAL #2   Title Left wrist active range of motion improved for patient to be able to turn doorknob, push and pull heavy door open without pain    Baseline L wrist AROM cont to be decrease with pain in flexion, ext mostly 8/10 - not using hand / wrist    Time 6    Period Weeks    Status On-going    Target Date 02/20/22      OT LONG TERM GOAL #3   Title L thumb AROM increase to [facility] to do buttons, and be able to hygiene and play guitar without increase symptoms    Baseline decrease thumb AROM in all planes with pain increase 8/10 since non displaced close fx  at thumb L - no using thumb    Time 6    Period Weeks    Status On-going    Target Date 02/20/22      OT LONG TERM GOAL #4   Title Left grip and prehension strength increased to more than 50% compared to the right to return back to prior level  of function and ADLs and IADLs    Baseline NT pt return to OT now after non displace close fx of L thumb - pain AROM 8/10 and edema increase    Time 6    Period Weeks    Status On-going    Target Date 02/20/22                   Plan - 01/09/22 2014     Clinical Impression Statement Pt refer to OT s/p limited arthroscopic debridement, arthroscopic subacromial decompression, arthroscopic distal clavicle excision, mini open rotator cuff repair, and mini-open biceps tenodesis of his left shoulder on 07/25/21. Pt is about 5 months s/p.  Pt was seen by this OT prior to early Sept to decrease edema/pain- increase motion - did great progress but then fell and fx his thumb non displace fx- Pt return this date more than 2 months out form injury to cont OT. Pt show decrease AROM in digits, thumb and wrist - with increase pain at thumb  with edema. Decrease strength with increase shaking in thumb and digits when attempting to do ROM or use.  Unable to use L hand in bathroom hygiene and playing guitar.  Pt can benefit from skilled OT services to decrease edema and pain p with increase ROM and strength in L hand and wrist to return to prior level of function prior to shoulder surgery- pt lives alone - has aid 3 x wk for 1-2 hrs per pt.    OT Occupational Profile and History Problem Focused Assessment - Including review of records relating to presenting problem    Occupational performance deficits (Please refer to evaluation for details): ADL's;IADL's;Play;Social Participation;Leisure    Body Structure / Function / Physical Skills ADL;Coordination;Flexibility;FMC;IADL;ROM;UE functional use;Edema;Dexterity;Pain;Strength    Rehab Potential Good    Clinical Decision Making Limited treatment options, no task modification necessary    Comorbidities Affecting Occupational Performance: None    Modification or Assistance to Complete Evaluation  No modification of tasks or assist necessary to complete eval    OT Frequency 2x / week    OT Duration 6 weeks    OT Treatment/Interventions Self-care/ADL training;Paraffin;Moist Heat;Fluidtherapy;Contrast Bath;Therapeutic exercise;Manual Therapy;Patient/family education;Passive range of motion;DME and/or AE instruction;Splinting    Consulted and Agree with Plan of Care Patient             Patient will benefit from skilled therapeutic intervention in order to improve the following deficits and impairments:   Body Structure / Function / Physical Skills: ADL, Coordination, Flexibility, FMC, IADL, ROM, UE functional use, Edema, Dexterity, Pain, Strength       Visit Diagnosis: Pain in left hand  Pain in left wrist  Stiffness of left hand, not elsewhere classified  Stiffness of left wrist, not elsewhere classified  Localized edema  Muscle weakness (generalized)    Problem  List Patient Active Problem List   Diagnosis Date Noted   History of DVT of lower extremity 12/14/2021   Ileus ([place]) 04/25/2021   Acid reflux    Emphysema of lung ([facility])    S/P umbilical hernia repair, follow-up exam 04/30/2019   Erythrocytosis 04/17/2019   Positive fecal occult blood test    Polyp of colon    Diverticulosis of large intestine without diverticulitis    CLE (columnar lined esophagus)    DOE (dyspnea on exertion) 07/19/2017   Atypical chest pain 07/19/2017   Deep vein thrombosis (DVT) of popliteal vein of right lower extremity ([facility]) 07/19/2017   Benign prostatic  hyperplasia 06/25/2017   Essential hypertension 06/25/2017   Esophageal dysphagia 01/31/2017   Epigastric pain 01/31/2017   Obesity, unspecified 06/26/2013    Rosalyn Gess, OTR/L,CLT 01/09/2022, 8:24 PM  [facility] [address] [place], [address] Phone: [phone]   Fax:  [phone]  Name: DELRON COMER MRN: [id number] Date of Birth: 01-02-1946

## 2022-01-16 ENCOUNTER — Ambulatory Visit: Payer: Medicare Other | Admitting: Occupational Therapy

## 2022-01-16 DIAGNOSIS — M6281 Muscle weakness (generalized): Secondary | ICD-10-CM

## 2022-01-16 DIAGNOSIS — M25642 Stiffness of left hand, not elsewhere classified: Secondary | ICD-10-CM

## 2022-01-16 DIAGNOSIS — M25632 Stiffness of left wrist, not elsewhere classified: Secondary | ICD-10-CM

## 2022-01-16 DIAGNOSIS — R600 Localized edema: Secondary | ICD-10-CM

## 2022-01-16 DIAGNOSIS — M79642 Pain in left hand: Secondary | ICD-10-CM

## 2022-01-16 DIAGNOSIS — M25532 Pain in left wrist: Secondary | ICD-10-CM | POA: Diagnosis present

## 2022-01-16 NOTE — Therapy (Signed)
[place] [facility] [address] [place], [address] Phone: [phone]   Fax:  [phone]  Occupational Therapy Treatment  Patient Details  Name: Zachary Sweeney MRN: [id number] Date of Birth: [birth year] Referring Provider (OT): DR Poggi   Encounter Date: 01/16/2022   OT End of Session - 01/16/22 1418     Visit Number 5    Number of Visits 12    Date for OT Re-Evaluation 02/20/22    OT Start Time 1418    OT Stop Time 1451    OT Time Calculation (min) 33 min    Activity Tolerance Patient tolerated treatment well    Behavior During Therapy [facility] for tasks assessed/performed             Past Medical History:  Diagnosis Date   Acid reflux    Arthritis    Back injury    Colon polyp    DVT (deep venous thrombosis) ([place])    a. 06/2017 LE U/S: Right popliteal vein DVT.   Dyspnea on exertion    a. 06/2017 CTA Chest: No PE. No significant coronary Ca2+, centrilobular and paraseptal emphysema;  b.  07/2017 Echo: EF 60-65%, no rwma, Gr1 DD, mildly dil LA. Nl RV fxn; c. 07/2017 MV: Small, severe defect involving apical inf and apical segments - only on rest images consistent w/ artifact. No ischemia or scar.   Emphysema of lung ([place])    a. 06/2017 CTA Chest: Centrilobular and paraseptal emphysema, with mild geographic ground-glass opacity likely representing small airway dzs.   Erythrocytosis 04/17/2019   Hypertension    Sleep apnea    Torn ligament     Past Surgical History:  Procedure Laterality Date   COLONOSCOPY  2008   COLONOSCOPY WITH PROPOFOL N/A 02/27/2017   Procedure: COLONOSCOPY WITH PROPOFOL;  Surgeon: Robert Bellow, MD;  Location: [facility] ENDOSCOPY;  Service: Endoscopy;  Laterality: N/A;   COLONOSCOPY WITH PROPOFOL N/A 07/24/2018   Procedure: COLONOSCOPY WITH PROPOFOL;  Surgeon: Virgel Manifold, MD;  Location: [facility] ENDOSCOPY;  Service: Endoscopy;  Laterality: N/A;   ESOPHAGOGASTRODUODENOSCOPY (EGD)  WITH PROPOFOL N/A 02/27/2017   Procedure: ESOPHAGOGASTRODUODENOSCOPY (EGD) WITH PROPOFOL;  Surgeon: Robert Bellow, MD;  Location: [facility] ENDOSCOPY;  Service: Endoscopy;  Laterality: N/A;   ESOPHAGOGASTRODUODENOSCOPY (EGD) WITH PROPOFOL N/A 07/24/2018   Procedure: ESOPHAGOGASTRODUODENOSCOPY (EGD) WITH PROPOFOL;  Surgeon: Virgel Manifold, MD;  Location: [facility] ENDOSCOPY;  Service: Endoscopy;  Laterality: N/A;   HERNIA REPAIR Right    inguinal   INSERTION OF MESH  02/01/2021   Procedure: INSERTION OF MESH;  Surgeon: Herbert Pun, MD;  Location: [facility] ORS;  Service: General;;   KNEE SURGERY Left    NECK SURGERY     ROTATOR CUFF REPAIR Right    SHOULDER ARTHROSCOPY WITH SUBACROMIAL DECOMPRESSION, ROTATOR CUFF REPAIR AND BICEP TENDON REPAIR Left 07/25/2021   Procedure: LEFT SHOULDER ARTHROSCOPY WITH DEBRIDEMENT, DECOMPRESSION, DISTAL CLAVICLE EXCISION, BICEPS TENODESIS;  Surgeon: Corky Mull, MD;  Location: [facility] ORS;  Service: Orthopedics;  Laterality: Left;   UMBILICAL HERNIA REPAIR N/A 04/22/2019   Procedure: HERNIA REPAIR UMBILICAL ADULT;  Surgeon: Ronny Bacon, MD;  Location: [facility] ORS;  Service: General;  Laterality: N/A;    There were no vitals filed for this visit.   Subjective Assessment - 01/16/22 1416     Subjective  I have bee tn trying to do the exercises but don't know if better - trying to pull up pants but weak and hurts-  less tremoring I think    Pertinent History Zachary Sweeney is a 76 y.o. male who presents for follow-up with Dr Roland Rack 10/06/21 10 weeks status post a limited arthroscopic debridement, arthroscopic subacromial decompression, arthroscopic distal clavicle excision, mini open rotator cuff repair, and mini-open biceps tenodesis of his left shoulder done 07/25/21. Overall, the patient feels that his shoulder is doing reasonably well. He still notes some soreness in the shoulder for which he will apply ice and take an occasional oxycodone for discomfort, especially  associated with physical therapy. He continues to attend physical therapy, as well as to perform exercises on his own at home and feels that his range of motion and strength continue to improve. He has resumed many of his normal daily activities without difficulty. He does have difficulty sleeping at night, but does not feel that it is due to his shoulder. He denies any recent reinjury to the shoulder, and denies any fevers or chills.    The patient's primary concern today is continued left hand pain and stiffness. He notes that the symptoms have been present since his surgery. They were worse at his last visit, but he notes that the therapist has been working with him and his motion has improved somewhat. However, he still is not yet able to make a fist and has pain with maximal active flexion attempts as well as with passive flexion. He denies any injury to the hand, and denies any numbness or paresthesias to his fingers. refer to OT but then had fx of thumb early Sept - immobilize -refer back to OT    Patient Stated Goals Want my hand and thumb better the swelling, pain and motion /strength so I can take care of myself - toiletting , gripping things and play the bass    Currently in Pain? No/denies                [facility] OT Assessment - 01/16/22 0001       Strength   Right Hand Grip (lbs) 110    Right Hand Lateral Pinch 24 lbs    Right Hand 3 Point Pinch 15 lbs    Left Hand Grip (lbs) 30    Left Hand Lateral Pinch 10 lbs    Left Hand 3 Point Pinch 6 lbs      Left Hand AROM   L Thumb MCP 0-60 50 Degrees    L Thumb IP 0-80 38 Degrees    L Thumb Radial ADduction/ABduction 0-55 55    L Thumb Palmar ADduction/ABduction 0-45 60                      OT Treatments/Exercises (OP) - 01/16/22 0001       LUE Fluidotherapy   Number Minutes Fluidotherapy 8 Minutes    LUE Fluidotherapy Location Hand   wrist   Comments thumb in all planes and wrist prior to review of HEP            Soft tissue massage to [facility] and CT spreads -as well as graston tool nr 2 sweeping over volar wrist and forearm and radial wrist prior to ROM  Fitted with neoprene wrist and thumb wrap to support during functional use thumb and wrist to decrease pain  REview with pt AROM after doing contrast at home Cont night time isotoner glove night time to decrease edema  In L hand  AROM for PA and RA of thumb  Blocked AROM for IP and MC  of thumb  Prior to picking up 2cm foam block alternate digits- 8 reps Keep pain under 2/10  Tendon glides - pt to block hand and wrist to decrease tremoring 10 reps And add light blue putty for gripping 15 reps pain free  2 x day   AROM wrist flexoin, extention  HEP 2-3 x day         OT Education - 01/16/22 1417     Education Details progres and HEP    Person(s) Educated Patient    Methods Explanation;Tactile cues;Verbal cues;Handout;Demonstration    Comprehension Verbalized understanding;Returned demonstration;Verbal cues required;Tactile cues required              OT Short Term Goals - 01/09/22 2013       OT SHORT TERM GOAL #1   Title Patient to be independent in home program to decrease L hand and wrist  edema and pain to have increase left wrist and digit range of motion    Baseline Pt return this date after having thumb close non displace fx early Sept - need review again with HEP- show increase edema, pain and decrease ROM again    Time 3    Period Weeks    Status On-going    Target Date 01/30/22               OT Long Term Goals - 01/09/22 2019       OT LONG TERM GOAL #1   Title Left digits active range of motion increased for patient to be able to touch palm to use hand and eating, bathing and dressing with more ease    Baseline decrease Flexion in all digits and thumb -shaking of digits and thumb when attempt to do ROM or use - pain increase in thumb 8/10    Time 5    Period Weeks    Status On-going    Target Date 02/13/22       OT LONG TERM GOAL #2   Title Left wrist active range of motion improved for patient to be able to turn doorknob, push and pull heavy door open without pain    Baseline L wrist AROM cont to be decrease with pain in flexion, ext mostly 8/10 - not using hand / wrist    Time 6    Period Weeks    Status On-going    Target Date 02/20/22      OT LONG TERM GOAL #3   Title L thumb AROM increase to [facility] to do buttons, and be able to hygiene and play guitar without increase symptoms    Baseline decrease thumb AROM in all planes with pain increase 8/10 since non displaced close fx  at thumb L - no using thumb    Time 6    Period Weeks    Status On-going    Target Date 02/20/22      OT LONG TERM GOAL #4   Title Left grip and prehension strength increased to more than 50% compared to the right to return back to prior level of function and ADLs and IADLs    Baseline NT pt return to OT now after non displace close fx of L thumb - pain AROM 8/10 and edema increase    Time 6    Period Weeks    Status On-going    Target Date 02/20/22                   Plan - 01/16/22 1435  Clinical Impression Statement Pt refer to OT s/p limited arthroscopic debridement, arthroscopic subacromial decompression, arthroscopic distal clavicle excision, mini open rotator cuff repair, and mini-open biceps tenodesis of his left shoulder on 07/25/21. Pt is about 5 months s/p.  Pt was seen by this OT prior to early Sept to decrease edema/pain- increase motion - did great progress but then fell and fx his thumb non displace fx- Ptnow out more than  2 months from fx. Pt making progress this date in AROM in digits, thumb and wrist - pain in wrist and end range digits flexion- fitted with thumb /wrrist neoprene wrap for increase funcitonal use and strengthening with less pain. Pt showing less tremoring but cont to  show decrease AROM thumb and wrist - with increase pain at thumb with edema. Decrease strength with  cont  shaking in thumb and digits when attempting to do opposition  Unable to use L hand in bathroom hygiene and playing guitar.  Pt can benefit from skilled OT services to decrease edema and pain p with increase ROM and strength in L hand and wrist to return to prior level of function prior to shoulder surgery- pt lives alone - has aid 3 x wk for 1-2 hrs per pt.    OT Occupational Profile and History Problem Focused Assessment - Including review of records relating to presenting problem    Occupational performance deficits (Please refer to evaluation for details): ADL's;IADL's;Play;Social Participation;Leisure    Body Structure / Function / Physical Skills ADL;Coordination;Flexibility;FMC;IADL;ROM;UE functional use;Edema;Dexterity;Pain;Strength    Rehab Potential Good    Clinical Decision Making Limited treatment options, no task modification necessary    Comorbidities Affecting Occupational Performance: None    Modification or Assistance to Complete Evaluation  No modification of tasks or assist necessary to complete eval    OT Frequency 2x / week    OT Duration 6 weeks    OT Treatment/Interventions Self-care/ADL training;Paraffin;Moist Heat;Fluidtherapy;Contrast Bath;Therapeutic exercise;Manual Therapy;Patient/family education;Passive range of motion;DME and/or AE instruction;Splinting    Consulted and Agree with Plan of Care Patient             Patient will benefit from skilled therapeutic intervention in order to improve the following deficits and impairments:   Body Structure / Function / Physical Skills: ADL, Coordination, Flexibility, FMC, IADL, ROM, UE functional use, Edema, Dexterity, Pain, Strength       Visit Diagnosis: Pain in left hand  Pain in left wrist  Stiffness of left hand, not elsewhere classified  Stiffness of left wrist, not elsewhere classified  Localized edema  Muscle weakness (generalized)    Problem List Patient Active Problem List   Diagnosis Date Noted    History of DVT of lower extremity 12/14/2021   Ileus ([place]) 04/25/2021   Acid reflux    Emphysema of lung ([facility])    S/P umbilical hernia repair, follow-up exam 04/30/2019   Erythrocytosis 04/17/2019   Positive fecal occult blood test    Polyp of colon    Diverticulosis of large intestine without diverticulitis    CLE (columnar lined esophagus)    DOE (dyspnea on exertion) 07/19/2017   Atypical chest pain 07/19/2017   Deep vein thrombosis (DVT) of popliteal vein of right lower extremity ([place]) 07/19/2017   Benign prostatic hyperplasia 06/25/2017   Essential hypertension 06/25/2017   Esophageal dysphagia 01/31/2017   Epigastric pain 01/31/2017   Obesity, unspecified 06/26/2013    Rosalyn Gess, OTR/L,CLT 01/16/2022, 7:40 PM  [place] [facility] [address], [place], [address] Phone: [phone]   Fax:  [phone]  Name: Zachary Sweeney MRN: [id number] Date of Birth: 03-Aug-1945

## 2022-01-18 ENCOUNTER — Ambulatory Visit: Payer: Medicare Other | Admitting: Occupational Therapy

## 2022-01-18 DIAGNOSIS — M79642 Pain in left hand: Secondary | ICD-10-CM | POA: Diagnosis not present

## 2022-01-18 DIAGNOSIS — R600 Localized edema: Secondary | ICD-10-CM

## 2022-01-18 DIAGNOSIS — M25642 Stiffness of left hand, not elsewhere classified: Secondary | ICD-10-CM

## 2022-01-18 DIAGNOSIS — M6281 Muscle weakness (generalized): Secondary | ICD-10-CM

## 2022-01-18 DIAGNOSIS — M25632 Stiffness of left wrist, not elsewhere classified: Secondary | ICD-10-CM

## 2022-01-18 DIAGNOSIS — M25532 Pain in left wrist: Secondary | ICD-10-CM

## 2022-01-18 NOTE — Therapy (Signed)
[facility] [address] [place], [address] Phone: [phone]   Fax:  [phone]  Occupational Therapy Treatment  Patient Details  Name: Zachary Sweeney MRN: [id number] Date of Birth: [birth year] Referring Provider (OT): DR Poggi   Encounter Date: 01/18/2022   OT End of Session - 01/18/22 1336     Visit Number 6    Number of Visits 12    Date for OT Re-Evaluation 02/20/22    OT Start Time 1336    OT Stop Time 1425    OT Time Calculation (min) 49 min    Activity Tolerance Patient tolerated treatment well    Behavior During Therapy [facility] for tasks assessed/performed             Past Medical History:  Diagnosis Date   Acid reflux    Arthritis    Back injury    Colon polyp    DVT (deep venous thrombosis) ([place])    a. 06/2017 LE U/S: Right popliteal vein DVT.   Dyspnea on exertion    a. 06/2017 CTA Chest: No PE. No significant coronary Ca2+, centrilobular and paraseptal emphysema;  b.  07/2017 Echo: EF 60-65%, no rwma, Gr1 DD, mildly dil LA. Nl RV fxn; c. 07/2017 MV: Small, severe defect involving apical inf and apical segments - only on rest images consistent w/ artifact. No ischemia or scar.   Emphysema of lung ([place])    a. 06/2017 CTA Chest: Centrilobular and paraseptal emphysema, with mild geographic ground-glass opacity likely representing small airway dzs.   Erythrocytosis 04/17/2019   Hypertension    Sleep apnea    Torn ligament     Past Surgical History:  Procedure Laterality Date   COLONOSCOPY  2008   COLONOSCOPY WITH PROPOFOL N/A 02/27/2017   Procedure: COLONOSCOPY WITH PROPOFOL;  Surgeon: Robert Bellow, MD;  Location: [facility];  Service: Endoscopy;  Laterality: N/A;   COLONOSCOPY WITH PROPOFOL N/A 07/24/2018   Procedure: COLONOSCOPY WITH PROPOFOL;  Surgeon: Virgel Manifold, MD;  Location: [facility];  Service: Endoscopy;  Laterality: N/A;   ESOPHAGOGASTRODUODENOSCOPY (EGD)  WITH PROPOFOL N/A 02/27/2017   Procedure: ESOPHAGOGASTRODUODENOSCOPY (EGD) WITH PROPOFOL;  Surgeon: Robert Bellow, MD;  Location: [facility]OS[facility];  Service: Endoscopy;  Laterality: N/A;   ESOPHAGOGASTRODUODENOSCOPY (EGD) WITH PROPOFOL N/A 07/24/2018   Procedure: ESOPHAGOGASTRODUODENOSCOPY (EGD) WITH PROPOFOL;  Surgeon: Virgel Manifold, MD;  Location: [facility] ENDOS[facility];  Service: Endoscopy;  Laterality: N/A;   HERNIA REPAIR Right    inguinal   INSERTION OF MESH  02/01/2021   Procedure: INSERTION OF MESH;  Surgeon: Herbert Pun, MD;  Location: [facility] ORS;  Service: General;;   KNEE SURGERY Left    NECK SURGERY     ROTATOR CUFF REPAIR Right    SHOULDER ARTHROSCOPY WITH SUBACROMIAL DECOMPRESSION, ROTATOR CUFF REPAIR AND BICEP TENDON REPAIR Left 07/25/2021   Procedure: LEFT SHOULDER ARTHROSCOPY WITH DEBRIDEMENT, DECOMPRESSION, DISTAL CLAVICLE EXCISION, BICEPS TENODESIS;  Surgeon: Corky Mull, MD;  Location: [facility] ORS;  Service: Orthopedics;  Laterality: Left;   UMBILICAL HERNIA REPAIR N/A 04/22/2019   Procedure: HERNIA REPAIR UMBILICAL ADULT;  Surgeon: Ronny Bacon, MD;  Location: [facility] ORS;  Service: General;  Laterality: N/A;    There were no vitals filed for this visit.   Subjective Assessment - 01/18/22 1335     Subjective  The black splint gave my thumb and wrist some support - I think I over done using my thumb when I did my shoulder  exercises at PT    Pertinent History Zachary Sweeney is a 76 y.o. male who presents for follow-up with Dr Roland Rack 10/06/21 10 weeks status post a limited arthroscopic debridement, arthroscopic subacromial decompression, arthroscopic distal clavicle excision, mini open rotator cuff repair, and mini-open biceps tenodesis of his left shoulder done 07/25/21. Overall, the patient feels that his shoulder is doing reasonably well. He still notes some soreness in the shoulder for which he will apply ice and take an occasional oxycodone for discomfort, especially  associated with physical therapy. He continues to attend physical therapy, as well as to perform exercises on his own at home and feels that his range of motion and strength continue to improve. He has resumed many of his normal daily activities without difficulty. He does have difficulty sleeping at night, but does not feel that it is due to his shoulder. He denies any recent reinjury to the shoulder, and denies any fevers or chills.    The patient's primary concern today is continued left hand pain and stiffness. He notes that the symptoms have been present since his surgery. They were worse at his last visit, but he notes that the therapist has been working with him and his motion has improved somewhat. However, he still is not yet able to make a fist and has pain with maximal active flexion attempts as well as with passive flexion. He denies any injury to the hand, and denies any numbness or paresthesias to his fingers. refer to OT but then had fx of thumb early Sept - immobilize -refer back to OT    Patient Stated Goals Want my hand and thumb better the swelling, pain and motion /strength so I can take care of myself - toiletting , gripping things and play the bass    Currently in Pain? No/denies                [facility] OT Assessment - 01/18/22 0001       AROM   Left Wrist Extension 70 Degrees    Left Wrist Flexion 75 Degrees   in session     Strength   Left Hand Grip (lbs) 38    Left Hand Lateral Pinch 5 lbs   pain - in session silicon sleeve 10 lbs   Left Hand 3 Point Pinch 7 lbs   9 silicon sleeve     Left Hand AROM   L Thumb Opposition to Index --   opposition to 5th   L Index  MCP 0-90 65 Degrees    L Index PIP 0-100 95 Degrees    L Long  MCP 0-90 70 Degrees    L Long PIP 0-100 90 Degrees    L Ring  MCP 0-90 75 Degrees    L Ring PIP 0-100 95 Degrees    L Little  MCP 0-90 75 Degrees    L Little PIP 0-100 95 Degrees                 Patient reports increased thumb pain  with attempting to do toilet hygiene, buttons or pulling up pants as well as lifting a cup.  Patient reported using his hand a lot in PT for shoulder exercises yesterday. Recommended for patient may be to take his thumb spica to use during PT session.     OT Treatments/Exercises (OP) - 01/18/22 0001       LUE Fluidotherapy   Number Minutes Fluidotherapy 8 Minutes    LUE Fluidotherapy Location Hand  Comments thumb and wrist AROM             Soft tissue massage to [facility] and CT spreads -as well as graston tool nr 2 sweeping over volar wrist and forearm and dorsal wrist-focusing on wrist flexion open hand and loose fist with great success.  Increasing to 75 degrees. Patient to focus on active assisted range for wrist flexion and extension with a loose fist and open hand at home. Continue to wear neoprene wrist and thumb wrap to support the wrist and thumb during functional tasks to decrease pain  This date patient with increased IP flexion of thumb fitted with a silicone sleeve with great success decreasing pain during opposition Able to add rubber band for palmar radial abduction of thumb 2 sets of 8 pain-free Patient can wear it in combination with neoprene wrap but only functional tasks  REview with pt AROM after doing contrast at home Cont night time isotoner glove night time to decrease edema  In L hand  AROM for PA and RA of thumb  Blocked AROM for IP and MC of thumb  Prior to picking up 1cm foam block alternate digits- 8 reps Keep pain under 2/10  Tendon glides - pt to block hand and wrist to decrease tremoring 10 reps Continue light blue putty for gripping 15 reps pain free  2 x day   HEP 2-3 x day    Patient did attempt to play 3 to 5 minutes of bass or guitar but using neoprene wrist and thumb wrap and silicone sleeve on thumb IP to decrease pain.  Simulating session with great success  Information was provided for patient for toilet hygiene aid to use at home          OT Education - 01/18/22 1336     Education Details progress and  changes HEP    Person(s) Educated Patient    Methods Explanation;Tactile cues;Verbal cues;Handout;Demonstration    Comprehension Verbalized understanding;Returned demonstration;Verbal cues required;Tactile cues required              OT Short Term Goals - 01/09/22 2013       OT SHORT TERM GOAL #1   Title Patient to be independent in home program to decrease L hand and wrist  edema and pain to have increase left wrist and digit range of motion    Baseline Pt return this date after having thumb close non displace fx early Sept - need review again with HEP- show increase edema, pain and decrease ROM again    Time 3    Period Weeks    Status On-going    Target Date 01/30/22               OT Long Term Goals - 01/09/22 2019       OT LONG TERM GOAL #1   Title Left digits active range of motion increased for patient to be able to touch palm to use hand and eating, bathing and dressing with more ease    Baseline decrease Flexion in all digits and thumb -shaking of digits and thumb when attempt to do ROM or use - pain increase in thumb 8/10    Time 5    Period Weeks    Status On-going    Target Date 02/13/22      OT LONG TERM GOAL #2   Title Left wrist active range of motion improved for patient to be able to turn doorknob, push and pull heavy door open  without pain    Baseline L wrist AROM cont to be decrease with pain in flexion, ext mostly 8/10 - not using hand / wrist    Time 6    Period Weeks    Status On-going    Target Date 02/20/22      OT LONG TERM GOAL #3   Title L thumb AROM increase to [facility] to do buttons, and be able to hygiene and play guitar without increase symptoms    Baseline decrease thumb AROM in all planes with pain increase 8/10 since non displaced close fx  at thumb L - no using thumb    Time 6    Period Weeks    Status On-going    Target Date 02/20/22      OT LONG TERM  GOAL #4   Title Left grip and prehension strength increased to more than 50% compared to the right to return back to prior level of function and ADLs and IADLs    Baseline NT pt return to OT now after non displace close fx of L thumb - pain AROM 8/10 and edema increase    Time 6    Period Weeks    Status On-going    Target Date 02/20/22                   Plan - 01/18/22 1336     Clinical Impression Statement Pt refer to OT s/p limited arthroscopic debridement, arthroscopic subacromial decompression, arthroscopic distal clavicle excision, mini open rotator cuff repair, and mini-open biceps tenodesis of his left shoulder on 07/25/21. Pt is about 5 months s/p.  Pt was seen by this OT prior to early Sept to decrease edema/pain- increase motion - did great progress but then fell and fx his thumb non displace fx- Ptnow out more than  2 months from fx. Pt making progress in thumb , digits and wrist  AROM - focus on wrist flexion this date with soft tissue massage prior , thumb flexion and opposition with less pain - done great with silicon sleeve on IP. Able to add strengthening for PA and RA of thumb and also simulating playing cords on guitar/bass with less pain. Pt to cont wearing  thumb /wrrist neoprene wrap with silicon sleeve on thumb IP for funcitonal use and strengthening with lessen pain.  Pt can benefit from skilled OT services to decrease edema and pain p with increase ROM and strength in L hand and wrist to return to prior level of function prior to shoulder surgery- pt lives alone - has aid 3 x wk for 1-2 hrs per pt.    OT Occupational Profile and History Problem Focused Assessment - Including review of records relating to presenting problem    Occupational performance deficits (Please refer to evaluation for details): ADL's;IADL's;Play;Social Participation;Leisure    Body Structure / Function / Physical Skills ADL;Coordination;Flexibility;FMC;IADL;ROM;UE functional  use;Edema;Dexterity;Pain;Strength    Rehab Potential Good    Clinical Decision Making Limited treatment options, no task modification necessary    Comorbidities Affecting Occupational Performance: None    Modification or Assistance to Complete Evaluation  No modification of tasks or assist necessary to complete eval    OT Frequency 2x / week    OT Duration 6 weeks    OT Treatment/Interventions Self-care/ADL training;Paraffin;Moist Heat;Fluidtherapy;Contrast Bath;Therapeutic exercise;Manual Therapy;Patient/family education;Passive range of motion;DME and/or AE instruction;Splinting    Consulted and Agree with Plan of Care Patient  Patient will benefit from skilled therapeutic intervention in order to improve the following deficits and impairments:   Body Structure / Function / Physical Skills: ADL, Coordination, Flexibility, FMC, IADL, ROM, UE functional use, Edema, Dexterity, Pain, Strength       Visit Diagnosis: Pain in left hand  Pain in left wrist  Stiffness of left hand, not elsewhere classified  Stiffness of left wrist, not elsewhere classified  Localized edema  Muscle weakness (generalized)    Problem List Patient Active Problem List   Diagnosis Date Noted   History of DVT of lower extremity 12/14/2021   Ileus ([place]) 04/25/2021   Acid reflux    Emphysema of lung ([facility])    S/P umbilical hernia repair, follow-up exam 04/30/2019   Erythrocytosis 04/17/2019   Positive fecal occult blood test    Polyp of colon    Diverticulosis of large intestine without diverticulitis    CLE (columnar lined esophagus)    DOE (dyspnea on exertion) 07/19/2017   Atypical chest pain 07/19/2017   Deep vein thrombosis (DVT) of popliteal vein of right lower extremity ([place]) 07/19/2017   Benign prostatic hyperplasia 06/25/2017   Essential hypertension 06/25/2017   Esophageal dysphagia 01/31/2017   Epigastric pain 01/31/2017   Obesity, unspecified 06/26/2013    Rosalyn Gess, OTR/L,CLT 01/18/2022, 2:31 PM  [facility] [address], [place], [address] Phone: [phone]   Fax:  [phone]  Name: Zachary Sweeney MRN: [id number] Date of Birth: March 28, 1945

## 2022-01-23 ENCOUNTER — Ambulatory Visit: Payer: Medicare Other | Attending: Surgery | Admitting: Occupational Therapy

## 2022-01-23 DIAGNOSIS — M25532 Pain in left wrist: Secondary | ICD-10-CM | POA: Diagnosis present

## 2022-01-23 DIAGNOSIS — M79642 Pain in left hand: Secondary | ICD-10-CM

## 2022-01-23 DIAGNOSIS — M6281 Muscle weakness (generalized): Secondary | ICD-10-CM | POA: Diagnosis present

## 2022-01-23 DIAGNOSIS — M25632 Stiffness of left wrist, not elsewhere classified: Secondary | ICD-10-CM

## 2022-01-23 DIAGNOSIS — M25642 Stiffness of left hand, not elsewhere classified: Secondary | ICD-10-CM

## 2022-01-23 DIAGNOSIS — R600 Localized edema: Secondary | ICD-10-CM | POA: Diagnosis present

## 2022-01-23 NOTE — Therapy (Signed)
[facility] [address] [place], [address] Phone: [phone]   Fax:  [phone]  Occupational Therapy Treatment  Patient Details  Name: Zachary Sweeney MRN: [id number] Date of Birth: 18-Oct-1945 Referring Provider (OT): DR Poggi   Encounter Date: 01/23/2022   OT End of Session - 01/23/22 1319     Visit Number 7    Number of Visits 12    Date for OT Re-Evaluation 02/20/22    OT Start Time 1319    OT Stop Time 1401    OT Time Calculation (min) 42 min    Activity Tolerance Patient tolerated treatment well    Behavior During Therapy [facility] for tasks assessed/performed             Past Medical History:  Diagnosis Date   Acid reflux    Arthritis    Back injury    Colon polyp    DVT (deep venous thrombosis) ([place])    a. 06/2017 LE U/S: Right popliteal vein DVT.   Dyspnea on exertion    a. 06/2017 CTA Chest: No PE. No significant coronary Ca2+, centrilobular and paraseptal emphysema;  b.  07/2017 Echo: EF 60-65%, no rwma, Gr1 DD, mildly dil LA. Nl RV fxn; c. 07/2017 MV: Small, severe defect involving apical inf and apical segments - only on rest images consistent w/ artifact. No ischemia or scar.   Emphysema of lung ([place])    a. 06/2017 CTA Chest: Centrilobular and paraseptal emphysema, with mild geographic ground-glass opacity likely representing small airway dzs.   Erythrocytosis 04/17/2019   Hypertension    Sleep apnea    Torn ligament     Past Surgical History:  Procedure Laterality Date   COLONOSCOPY  2008   COLONOSCOPY WITH PROPOFOL N/A 02/27/2017   Procedure: COLONOSCOPY WITH PROPOFOL;  Surgeon: Robert Bellow, MD;  Location: [facility];  Service: Endoscopy;  Laterality: N/A;   COLONOSCOPY WITH PROPOFOL N/A 07/24/2018   Procedure: COLONOSCOPY WITH PROPOFOL;  Surgeon: Virgel Manifold, MD;  Location: [facility];  Service: Endoscopy;  Laterality: N/A;   ESOPHAGOGASTRODUODENOSCOPY (EGD)  WITH PROPOFOL N/A 02/27/2017   Procedure: ESOPHAGOGASTRODUODENOSCOPY (EGD) WITH PROPOFOL;  Surgeon: Robert Bellow, MD;  Location: [facility];  Service: Endoscopy;  Laterality: N/A;   ESOPHAGOGASTRODUODENOSCOPY (EGD) WITH PROPOFOL N/A 07/24/2018   Procedure: ESOPHAGOGASTRODUODENOSCOPY (EGD) WITH PROPOFOL;  Surgeon: Virgel Manifold, MD;  Location: [facility];  Service: Endoscopy;  Laterality: N/A;   HERNIA REPAIR Right    inguinal   INSERTION OF MESH  02/01/2021   Procedure: INSERTION OF MESH;  Surgeon: Herbert Pun, MD;  Location: [facility];  Service: General;;   KNEE SURGERY Left    NECK SURGERY     ROTATOR CUFF REPAIR Right    SHOULDER ARTHROSCOPY WITH SUBACROMIAL DECOMPRESSION, ROTATOR CUFF REPAIR AND BICEP TENDON REPAIR Left 07/25/2021   Procedure: LEFT SHOULDER ARTHROSCOPY WITH DEBRIDEMENT, DECOMPRESSION, DISTAL CLAVICLE EXCISION, BICEPS TENODESIS;  Surgeon: Corky Mull, MD;  Location: [facility] OR[facility];  Service: Orthopedics;  Laterality: Left;   UMBILICAL HERNIA REPAIR N/A 04/22/2019   Procedure: HERNIA REPAIR UMBILICAL ADULT;  Surgeon: Ronny Bacon, MD;  Location: [facility];  Service: General;  Laterality: N/A;    There were no vitals filed for this visit.   Subjective Assessment - 01/23/22 1319     Subjective  Less pain in my thumb- done the putty for gripping and practice putting some pressue on my thumb to fingers- did loose my thumb  compression sleeve- not doing hot and cold anymore    Pertinent History Zachary Sweeney is a 76 y.o. male who presents for follow-up with Dr Roland Rack 10/06/21 10 weeks status post a limited arthroscopic debridement, arthroscopic subacromial decompression, arthroscopic distal clavicle excision, mini open rotator cuff repair, and mini-open biceps tenodesis of his left shoulder done 07/25/21. Overall, the patient feels that his shoulder is doing reasonably well. He still notes some soreness in the shoulder for which he will apply ice and take an  occasional oxycodone for discomfort, especially associated with physical therapy. He continues to attend physical therapy, as well as to perform exercises on his own at home and feels that his range of motion and strength continue to improve. He has resumed many of his normal daily activities without difficulty. He does have difficulty sleeping at night, but does not feel that it is due to his shoulder. He denies any recent reinjury to the shoulder, and denies any fevers or chills.    The patient's primary concern today is continued left hand pain and stiffness. He notes that the symptoms have been present since his surgery. They were worse at his last visit, but he notes that the therapist has been working with him and his motion has improved somewhat. However, he still is not yet able to make a fist and has pain with maximal active flexion attempts as well as with passive flexion. He denies any injury to the hand, and denies any numbness or paresthesias to his fingers. refer to OT but then had fx of thumb early Sept - immobilize -refer back to OT    Patient Stated Goals Want my hand and thumb better the swelling, pain and motion /strength so I can take care of myself - toiletting , gripping things and play the bass    Currently in Pain? No/denies                [facility] OT Assessment - 01/23/22 0001       Strength   Left Hand Grip (lbs) 47    Left Hand Lateral Pinch 15 lbs    Left Hand 3 Point Pinch 10 lbs             Patient arrived this date with less pain at the thumb IP.  With great progress in prehension strength as well as grip compared to last week. Patient is use silicone compression sleeve on thumb IP. Provided for patient with 2 new ones. Continues to be limited mostly at wrist flexion with some pain over dorsal wrist.         OT Treatments/Exercises (OP) - 01/23/22 0001       LUE Fluidotherapy   Number Minutes Fluidotherapy 8 Minutes    LUE Fluidotherapy Location  Hand    Comments thumb and digits AROM - 2 cycle cold            Soft tissue massage to [facility] and CT spreads -as well as graston tool nr 2 sweeping over volar wrist and forearm and dorsal wrist-focusing on wrist flexion open hand and loose fist with great success.   Patient to focus on active assisted range for wrist flexion and extension with a loose fist and open hand at home. Continue to wear neoprene wrist and thumb wrap to support the wrist and thumb during functional tasks to decrease pain  Increase IP flexion of thumb - cont silicone sleeve with great success decreasing pain during opposition and flexion -use of thumb  for pinching  Cont rubber band for palmar radial abduction of thumb 2 sets of 8 pain-free Patient can wear it in combination with neoprene wrap but only functional tasks   REview with pt AROM after doing contrast at home - pt stopped doing contrast  Cont night time isotoner glove night time to decrease edema  In L hand  AROM for PA and RA of thumb  Blocked AROM for IP and MC of thumb  Opposition alternate digits- 8 reps Keep pain under 2/10  Tendon glides - pt to block hand and wrist to decrease tremoring 10 reps Continue light blue putty for gripping 15 reps pain free  2 x day   HEP 2-3 x day  Add 1 lbs for sup/pro and RD, UD pain free 12 reps Can use thumb/wrist neoprene wrap              OT Education - 01/23/22 1319     Education Details progress and  changes HEP    Person(s) Educated Patient    Methods Explanation;Tactile cues;Verbal cues;Handout;Demonstration    Comprehension Verbalized understanding;Returned demonstration;Verbal cues required;Tactile cues required              OT Short Term Goals - 01/09/22 2013       OT SHORT TERM GOAL #1   Title Patient to be independent in home program to decrease L hand and wrist  edema and pain to have increase left wrist and digit range of motion    Baseline Pt return this date after having thumb  close non displace fx early Sept - need review again with HEP- show increase edema, pain and decrease ROM again    Time 3    Period Weeks    Status On-going    Target Date 01/30/22               OT Long Term Goals - 01/09/22 2019       OT LONG TERM GOAL #1   Title Left digits active range of motion increased for patient to be able to touch palm to use hand and eating, bathing and dressing with more ease    Baseline decrease Flexion in all digits and thumb -shaking of digits and thumb when attempt to do ROM or use - pain increase in thumb 8/10    Time 5    Period Weeks    Status On-going    Target Date 02/13/22      OT LONG TERM GOAL #2   Title Left wrist active range of motion improved for patient to be able to turn doorknob, push and pull heavy door open without pain    Baseline L wrist AROM cont to be decrease with pain in flexion, ext mostly 8/10 - not using hand / wrist    Time 6    Period Weeks    Status On-going    Target Date 02/20/22      OT LONG TERM GOAL #3   Title L thumb AROM increase to [facility] to do buttons, and be able to hygiene and play guitar without increase symptoms    Baseline decrease thumb AROM in all planes with pain increase 8/10 since non displaced close fx  at thumb L - no using thumb    Time 6    Period Weeks    Status On-going    Target Date 02/20/22      OT LONG TERM GOAL #4   Title Left grip and prehension strength increased  to more than 50% compared to the right to return back to prior level of function and ADLs and IADLs    Baseline NT pt return to OT now after non displace close fx of L thumb - pain AROM 8/10 and edema increase    Time 6    Period Weeks    Status On-going    Target Date 02/20/22                   Plan - 01/23/22 1320     Clinical Impression Statement Pt refer to OT s/p limited arthroscopic debridement, arthroscopic subacromial decompression, arthroscopic distal clavicle excision, mini open rotator cuff repair,  and mini-open biceps tenodesis of his left shoulder on 07/25/21. Pt is about 5 months s/p.  Pt was seen by this OT prior to early Sept to decrease edema/pain- increase motion - did great progress but then fell and fx his thumb non displace fx- Pt now out more than  2 months from fx. Pt making progress in thumb , digits and wrist  AROM - focus on wrist flexion this date with soft tissue massage prior , thumb flexion and opposition with less pain. Pt present this date with great progress in prehensiion strength and grip -with less pain- cont to do great with silicon sleeve on IP.  Pt to cont with strengthening for PA and RA of thumb, putty for gripping and 1 lbs for weight sup/pro, RD, UD pain free.  Pt to cont wearing  thumb /wrist neoprene wrap with silicon sleeve on thumb IP for funcitonal use and strengthening with lessen pain.  Pt can benefit from skilled OT services to decrease edema and pain p with increase ROM and strength in L hand and wrist to return to prior level of function prior to shoulder surgery- pt lives alone - has aid 3 x wk for 1-2 hrs per pt.    OT Occupational Profile and History Problem Focused Assessment - Including review of records relating to presenting problem    Occupational performance deficits (Please refer to evaluation for details): ADL's;IADL's;Play;Social Participation;Leisure    Body Structure / Function / Physical Skills ADL;Coordination;Flexibility;FMC;IADL;ROM;UE functional use;Edema;Dexterity;Pain;Strength    Rehab Potential Good    Clinical Decision Making Limited treatment options, no task modification necessary    Comorbidities Affecting Occupational Performance: None    Modification or Assistance to Complete Evaluation  No modification of tasks or assist necessary to complete eval    OT Frequency 2x / week    OT Duration 6 weeks    OT Treatment/Interventions Self-care/ADL training;Paraffin;Moist Heat;Fluidtherapy;Contrast Bath;Therapeutic exercise;Manual  Therapy;Patient/family education;Passive range of motion;DME and/or AE instruction;Splinting    Consulted and Agree with Plan of Care Patient             Patient will benefit from skilled therapeutic intervention in order to improve the following deficits and impairments:   Body Structure / Function / Physical Skills: ADL, Coordination, Flexibility, FMC, IADL, ROM, UE functional use, Edema, Dexterity, Pain, Strength       Visit Diagnosis: Pain in left hand  Pain in left wrist  Stiffness of left hand, not elsewhere classified  Stiffness of left wrist, not elsewhere classified  Localized edema  Muscle weakness (generalized)    Problem List Patient Active Problem List   Diagnosis Date Noted   History of DVT of lower extremity 12/14/2021   Ileus ([place]) 04/25/2021   Acid reflux    Emphysema of lung ([facility])    S/P umbilical hernia repair,  follow-up exam 04/30/2019   Erythrocytosis 04/17/2019   Positive fecal occult blood test    Polyp of colon    Diverticulosis of large intestine without diverticulitis    CLE (columnar lined esophagus)    DOE (dyspnea on exertion) 07/19/2017   Atypical chest pain 07/19/2017   Deep vein thrombosis (DVT) of popliteal vein of right lower extremity ([place]) 07/19/2017   Benign prostatic hyperplasia 06/25/2017   Essential hypertension 06/25/2017   Esophageal dysphagia 01/31/2017   Epigastric pain 01/31/2017   Obesity, unspecified 06/26/2013    Rosalyn Gess, OTR/L,CLT 01/23/2022, 3:23 PM  [facility] [address], [place], [address] Phone: [phone]   Fax:  [phone]  Name: JADRIAN BULMAN MRN: [id number] Date of Birth: May 07, 1945

## 2022-01-25 ENCOUNTER — Ambulatory Visit: Payer: Medicare Other | Admitting: Occupational Therapy

## 2022-01-25 DIAGNOSIS — M25532 Pain in left wrist: Secondary | ICD-10-CM

## 2022-01-25 DIAGNOSIS — M25642 Stiffness of left hand, not elsewhere classified: Secondary | ICD-10-CM

## 2022-01-25 DIAGNOSIS — M79642 Pain in left hand: Secondary | ICD-10-CM

## 2022-01-25 DIAGNOSIS — R600 Localized edema: Secondary | ICD-10-CM

## 2022-01-25 DIAGNOSIS — M25632 Stiffness of left wrist, not elsewhere classified: Secondary | ICD-10-CM

## 2022-01-25 DIAGNOSIS — M6281 Muscle weakness (generalized): Secondary | ICD-10-CM

## 2022-01-25 NOTE — Therapy (Signed)
[facility] [address], [place], [address] Phone: [phone]   Fax:  [phone]  Occupational Therapy Treatment  Patient Details  Name: Zachary Sweeney MRN: [id number] Date of Birth: [birth year] Referring Provider (OT): DR Poggi   Encounter Date: 01/25/2022   OT End of Session - 01/25/22 1419     Visit Number 8    Number of Visits 12    Date for OT Re-Evaluation 02/20/22    OT Start Time 1406    OT Stop Time 1445    OT Time Calculation (min) 39 min    Activity Tolerance Patient tolerated treatment well    Behavior During Therapy Zachary Sweeney for tasks assessed/performed             Past Medical History:  Diagnosis Date   Acid reflux    Arthritis    Back injury    Colon polyp    DVT (deep venous thrombosis) ([place])    a. 06/2017 LE U/S: Right popliteal vein DVT.   Dyspnea on exertion    a. 06/2017 CTA Chest: No PE. No significant coronary Ca2+, centrilobular and paraseptal emphysema;  b.  07/2017 Echo: EF 60-65%, no rwma, Gr1 DD, mildly dil LA. Nl RV fxn; c. 07/2017 MV: Small, severe defect involving apical inf and apical segments - only on rest images consistent w/ artifact. No ischemia or scar.   Emphysema of lung ([place])    a. 06/2017 CTA Chest: Centrilobular and paraseptal emphysema, with mild geographic ground-glass opacity likely representing small airway dzs.   Erythrocytosis 04/17/2019   Hypertension    Sleep apnea    Torn ligament     Past Surgical History:  Procedure Laterality Date   COLONOSCOPY  2008   COLONOSCOPY WITH PROPOFOL N/A 02/27/2017   Procedure: COLONOSCOPY WITH PROPOFOL;  Surgeon: Robert Bellow, MD;  Location: [facility];  Service: Endoscopy;  Laterality: N/A;   COLONOSCOPY WITH PROPOFOL N/A 07/24/2018   Procedure: COLONOSCOPY WITH PROPOFOL;  Surgeon: Virgel Manifold, MD;  Location: [facility];  Service: Endoscopy;  Laterality: N/A;   ESOPHAGOGASTRODUODENOSCOPY (EGD)  WITH PROPOFOL N/A 02/27/2017   Procedure: ESOPHAGOGASTRODUODENOSCOPY (EGD) WITH PROPOFOL;  Surgeon: Robert Bellow, MD;  Location: [facility];  Service: Endoscopy;  Laterality: N/A;   ESOPHAGOGASTRODUODENOSCOPY (EGD) WITH PROPOFOL N/A 07/24/2018   Procedure: ESOPHAGOGASTRODUODENOSCOPY (EGD) WITH PROPOFOL;  Surgeon: Virgel Manifold, MD;  Location: [facility];  Service: Endoscopy;  Laterality: N/A;   HERNIA REPAIR Right    inguinal   INSERTION OF MESH  02/01/2021   Procedure: INSERTION OF MESH;  Surgeon: Herbert Pun, MD;  Location: [facility];  Service: General;;   KNEE SURGERY Left    NECK SURGERY     ROTATOR CUFF REPAIR Right    SHOULDER ARTHROSCOPY WITH SUBACROMIAL DECOMPRESSION, ROTATOR CUFF REPAIR AND BICEP TENDON REPAIR Left 07/25/2021   Procedure: LEFT SHOULDER ARTHROSCOPY WITH DEBRIDEMENT, DECOMPRESSION, DISTAL CLAVICLE EXCISION, BICEPS TENODESIS;  Surgeon: Corky Mull, MD;  Location: [facility];  Service: Orthopedics;  Laterality: Left;   UMBILICAL HERNIA REPAIR N/A 04/22/2019   Procedure: HERNIA REPAIR UMBILICAL ADULT;  Surgeon: Ronny Bacon, MD;  Location: [facility];  Service: General;  Laterality: N/A;    There were no vitals filed for this visit.   Subjective Assessment - 01/25/22 1413     Subjective  My thumb and hand little tender or touchy today - I think I over done gripping when I did my shoulder exercises yesterday at  PT- but more able to pinch with my thumb - able to do more pressure- I think my wrist is little better too    Pertinent History Zachary Sweeney is a 76 y.o. male who presents for follow-up with Dr Roland Rack 10/06/21 10 weeks status post a limited arthroscopic debridement, arthroscopic subacromial decompression, arthroscopic distal clavicle excision, mini open rotator cuff repair, and mini-open biceps tenodesis of his left shoulder done 07/25/21. Overall, the patient feels that his shoulder is doing reasonably well. He still notes some soreness in the  shoulder for which he will apply ice and take an occasional oxycodone for discomfort, especially associated with physical therapy. He continues to attend physical therapy, as well as to perform exercises on his own at home and feels that his range of motion and strength continue to improve. He has resumed many of his normal daily activities without difficulty. He does have difficulty sleeping at night, but does not feel that it is due to his shoulder. He denies any recent reinjury to the shoulder, and denies any fevers or chills.    The patient's primary concern today is continued left hand pain and stiffness. He notes that the symptoms have been present since his surgery. They were worse at his last visit, but he notes that the therapist has been working with him and his motion has improved somewhat. However, he still is not yet able to make a fist and has pain with maximal active flexion attempts as well as with passive flexion. He denies any injury to the hand, and denies any numbness or paresthesias to his fingers. refer to OT but then had fx of thumb early Sept - immobilize -refer back to OT    Patient Stated Goals Want my hand and thumb better the swelling, pain and motion /strength so I can take care of myself - toiletting , gripping things and play the bass    Currently in Pain? Yes    Pain Score 4     Pain Location --   thumb and MC's   Pain Orientation Left    Pain Descriptors / Indicators Tightness;Tender;Sore    Pain Type Acute pain    Pain Onset More than a month ago    Pain Frequency Intermittent    Aggravating Factors  pinch                Patient arrived this date reporting still able to put more pressure on the thumb and pinching. A little more soreness in the hand and the thumb with using an exercise rot or a wand at PT yesterday. Reminded patient again not to over grip too tight objects because of edema in the hand limiting composite flexion especially over the MC's as well  as some edema on the wrist.          OT Treatments/Exercises (OP) - 01/25/22 0001       LUE Fluidotherapy   Number Minutes Fluidotherapy 8 Minutes    LUE Fluidotherapy Location Hand    Comments thumb and digits flexion             Soft tissue massage to [facility] and CT spreads -as well as graston tool nr 2 sweeping over volar wrist and forearm and dorsal wrist-focusing on wrist flexion open hand and loose fist with great success.   Less pain with wrist flexion this date. Focus on some soft tissue massage for decreasing edema in the forearm as well as wrist and hand and elevation. At  this stage Tubigrip E to use at nighttime over Isotoner glove to provide little more compression over the hand and wrist up to mid forearm. Patient to focus on active assisted range for wrist flexion and extension with a loose fist and open hand at home. Continue to wear neoprene wrist and thumb wrap to support the wrist and thumb during functional tasks to decrease pain  Increase IP flexion of thumb - cont silicone sleeve with great success decreasing pain during opposition and flexion -use of thumb for pinching -patient to use only with exercises if too tight Cont rubber band for palmar radial abduction of thumb 2 sets of 8 pain-free    REview with pt AROM after doing contrast at home - pt stopped doing contrast  Tendon glides - pt support forearm to decrease tremoring 10 reps Continue light blue putty for gripping 15 reps pain free-but hold off for 48 hours because of increased soreness in the hand  2 x day   HEP 2-3 x day  1 lbs for sup/pro and RD, UD pain free 12 reps Can use thumb/wrist neoprene wrap          OT Education - 01/25/22 1415     Education Details progress and  changes HEP    Person(s) Educated Patient    Methods Explanation;Tactile cues;Verbal cues;Handout;Demonstration    Comprehension Verbalized understanding;Returned demonstration;Verbal cues required;Tactile cues  required              OT Short Term Goals - 01/09/22 2013       OT SHORT TERM GOAL #1   Title Patient to be independent in home program to decrease L hand and wrist  edema and pain to have increase left wrist and digit range of motion    Baseline Pt return this date after having thumb close non displace fx early Sept - need review again with HEP- show increase edema, pain and decrease ROM again    Time 3    Period Weeks    Status On-going    Target Date 01/30/22               OT Long Term Goals - 01/09/22 2019       OT LONG TERM GOAL #1   Title Left digits active range of motion increased for patient to be able to touch palm to use hand and eating, bathing and dressing with more ease    Baseline decrease Flexion in all digits and thumb -shaking of digits and thumb when attempt to do ROM or use - pain increase in thumb 8/10    Time 5    Period Weeks    Status On-going    Target Date 02/13/22      OT LONG TERM GOAL #2   Title Left wrist active range of motion improved for patient to be able to turn doorknob, push and pull heavy door open without pain    Baseline L wrist AROM cont to be decrease with pain in flexion, ext mostly 8/10 - not using hand / wrist    Time 6    Period Weeks    Status On-going    Target Date 02/20/22      OT LONG TERM GOAL #3   Title L thumb AROM increase to [facility] to do buttons, and be able to hygiene and play guitar without increase symptoms    Baseline decrease thumb AROM in all planes with pain increase 8/10 since non displaced close fx  at  thumb L - no using thumb    Time 6    Period Weeks    Status On-going    Target Date 02/20/22      OT LONG TERM GOAL #4   Title Left grip and prehension strength increased to more than 50% compared to the right to return back to prior level of function and ADLs and IADLs    Baseline NT pt return to OT now after non displace close fx of L thumb - pain AROM 8/10 and edema increase    Time 6    Period  Weeks    Status On-going    Target Date 02/20/22                   Plan - 01/25/22 1420     Clinical Impression Statement Pt refer to OT s/p limited arthroscopic debridement, arthroscopic subacromial decompression, arthroscopic distal clavicle excision, mini open rotator cuff repair, and mini-open biceps tenodesis of his left shoulder on 07/25/21. Pt is about 5 months s/p.  Pt was seen by this OT prior to early Sept to decrease edema/pain- increase motion - did great progress but then fell and fx his thumb non displace fx- Pt now out more than  2 months from fx. Pt making progress in thumb , digits and wrist  AROM Pt  showed this week great progress in prehensiion strength and grip -with less pain- cont to do great with silicon sleeve on IP with thumb exercises.  Pt to cont with strengthening for PA and RA of thumb, putty for gripping and 1 lbs for weight sup/pro, RD, UD pain free. Pt still tremoring with opposition to 4th and 5th -and pain and increase tightness with composite flexion. Add this date tubigrip E for pt over isotoner glove for night time to decrease edema in hand over MC's and wrist.  Pt to cont wearing  thumb /wrist neoprene wrap for funcitonal use and  silicon sleeve on IP for strengthening  to decrease pain.  Pt can benefit from skilled OT services to decrease edema and pain with increase ROM and strength in L hand and wrist to return to prior level of function prior to shoulder surgery- pt lives alone - has aid 3 x wk for 1-2 hrs per pt.    OT Occupational Profile and History Problem Focused Assessment - Including review of records relating to presenting problem    Occupational performance deficits (Please refer to evaluation for details): ADL's;IADL's;Play;Social Participation;Leisure    Body Structure / Function / Physical Skills ADL;Coordination;Flexibility;FMC;IADL;ROM;UE functional use;Edema;Dexterity;Pain;Strength    Rehab Potential Good    Clinical Decision Making  Limited treatment options, no task modification necessary    Comorbidities Affecting Occupational Performance: None    Modification or Assistance to Complete Evaluation  No modification of tasks or assist necessary to complete eval    OT Frequency 2x / week    OT Duration 6 weeks    OT Treatment/Interventions Self-care/ADL training;Paraffin;Moist Heat;Fluidtherapy;Contrast Bath;Therapeutic exercise;Manual Therapy;Patient/family education;Passive range of motion;DME and/or AE instruction;Splinting    Consulted and Agree with Plan of Care Patient             Patient will benefit from skilled therapeutic intervention in order to improve the following deficits and impairments:   Body Structure / Function / Physical Skills: ADL, Coordination, Flexibility, FMC, IADL, ROM, UE functional use, Edema, Dexterity, Pain, Strength       Visit Diagnosis: Pain in left hand  Pain in left wrist  Stiffness of left hand, not elsewhere classified  Stiffness of left wrist, not elsewhere classified  Localized edema  Muscle weakness (generalized)    Problem List Patient Active Problem List   Diagnosis Date Noted   History of DVT of lower extremity 12/14/2021   Ileus ([place]) 04/25/2021   Acid reflux    Emphysema of lung ([facility])    S/P umbilical hernia repair, follow-up exam 04/30/2019   Erythrocytosis 04/17/2019   Positive fecal occult blood test    Polyp of colon    Diverticulosis of large intestine without diverticulitis    CLE (columnar lined esophagus)    DOE (dyspnea on exertion) 07/19/2017   Atypical chest pain 07/19/2017   Deep vein thrombosis (DVT) of popliteal vein of right lower extremity ([place]) 07/19/2017   Benign prostatic hyperplasia 06/25/2017   Essential hypertension 06/25/2017   Esophageal dysphagia 01/31/2017   Epigastric pain 01/31/2017   Obesity, unspecified 06/26/2013    Zachary Sweeney, Zachary Sweeney,Zachary Sweeney 01/25/2022, 4:16 PM  [facility] [address], [place], [address] Phone: [phone]   Fax:  [phone]  Name: Zachary Sweeney MRN: [id number] Date of Birth: 1945/03/16

## 2022-01-30 ENCOUNTER — Ambulatory Visit: Payer: Medicare Other | Admitting: Occupational Therapy

## 2022-02-01 ENCOUNTER — Ambulatory Visit: Payer: Medicare Other | Admitting: Occupational Therapy

## 2022-02-01 DIAGNOSIS — M6281 Muscle weakness (generalized): Secondary | ICD-10-CM

## 2022-02-01 DIAGNOSIS — M25532 Pain in left wrist: Secondary | ICD-10-CM

## 2022-02-01 DIAGNOSIS — M79642 Pain in left hand: Secondary | ICD-10-CM | POA: Diagnosis not present

## 2022-02-01 DIAGNOSIS — R600 Localized edema: Secondary | ICD-10-CM

## 2022-02-01 DIAGNOSIS — M25642 Stiffness of left hand, not elsewhere classified: Secondary | ICD-10-CM

## 2022-02-01 DIAGNOSIS — M25632 Stiffness of left wrist, not elsewhere classified: Secondary | ICD-10-CM

## 2022-02-01 NOTE — Therapy (Signed)
[facility] [address] [place], [address] Phone: [phone]   Fax:  [phone]  Occupational Therapy Treatment  Patient Details  Name: Zachary Sweeney MRN: [id number] Date of Birth: [birth year] Referring Provider (OT): DR Poggi   Encounter Date: 02/01/2022   OT End of Session - 02/01/22 1335     Visit Number 9    Number of Visits 12    Date for OT Re-Evaluation 02/20/22    OT Start Time 1319    OT Stop Time 1401    OT Time Calculation (min) 42 min    Activity Tolerance Patient tolerated treatment well    Behavior During Therapy [facility] for tasks assessed/performed             Past Medical History:  Diagnosis Date   Acid reflux    Arthritis    Back injury    Colon polyp    DVT (deep venous thrombosis) ([place])    a. 06/2017 LE U/S: Right popliteal vein DVT.   Dyspnea on exertion    a. 06/2017 CTA Chest: No PE. No significant coronary Ca2+, centrilobular and paraseptal emphysema;  b.  07/2017 Echo: EF 60-65%, no rwma, Gr1 DD, mildly dil LA. Nl RV fxn; c. 07/2017 MV: Small, severe defect involving apical inf and apical segments - only on rest images consistent w/ artifact. No ischemia or scar.   Emphysema of lung ([place])    a. 06/2017 CTA Chest: Centrilobular and paraseptal emphysema, with mild geographic ground-glass opacity likely representing small airway dzs.   Erythrocytosis 04/17/2019   Hypertension    Sleep apnea    Torn ligament     Past Surgical History:  Procedure Laterality Date   COLONOSCOPY  2008   COLONOSCOPY WITH PROPOFOL N/A 02/27/2017   Procedure: COLONOSCOPY WITH PROPOFOL;  Surgeon: Robert Bellow, MD;  Location: [facility] END[facility];  Service: Endoscopy;  Laterality: N/A;   COLONOSCOPY WITH PROPOFOL N/A 07/24/2018   Procedure: COLONOSCOPY WITH PROPOFOL;  Surgeon: Virgel Manifold, MD;  Location: [facility] ENDOSCOPY;  Service: Endoscopy;  Laterality: N/A;   ESOPHAGOGASTRODUODENOSCOPY (EGD)  WITH PROPOFOL N/A 02/27/2017   Procedure: ESOPHAGOGASTRODUODENOSCOPY (EGD) WITH PROPOFOL;  Surgeon: Robert Bellow, MD;  Location: [facility] ENDOS[facility];  Service: Endoscopy;  Laterality: N/A;   ESOPHAGOGASTRODUODENOSCOPY (EGD) WITH PROPOFOL N/A 07/24/2018   Procedure: ESOPHAGOGASTRODUODENOSCOPY (EGD) WITH PROPOFOL;  Surgeon: Virgel Manifold, MD;  Location: [facility] ENDOSCOPY;  Service: Endoscopy;  Laterality: N/A;   HERNIA REPAIR Right    inguinal   INSERTION OF MESH  02/01/2021   Procedure: INSERTION OF MESH;  Surgeon: Herbert Pun, MD;  Location: [facility] ORS;  Service: General;;   KNEE SURGERY Left    NECK SURGERY     ROTATOR CUFF REPAIR Right    SHOULDER ARTHROSCOPY WITH SUBACROMIAL DECOMPRESSION, ROTATOR CUFF REPAIR AND BICEP TENDON REPAIR Left 07/25/2021   Procedure: LEFT SHOULDER ARTHROSCOPY WITH DEBRIDEMENT, DECOMPRESSION, DISTAL CLAVICLE EXCISION, BICEPS TENODESIS;  Surgeon: Corky Mull, MD;  Location: [facility] ORS;  Service: Orthopedics;  Laterality: Left;   UMBILICAL HERNIA REPAIR N/A 04/22/2019   Procedure: HERNIA REPAIR UMBILICAL ADULT;  Surgeon: Ronny Bacon, MD;  Location: [facility] ORS;  Service: General;  Laterality: N/A;    There were no vitals filed for this visit.   Subjective Assessment - 02/01/22 1333     Subjective  My thumb has been hurting more since yesterday - don't know what I done - tender, and when trying to bend it or pinch  Pertinent History Zachary Sweeney is a 76 y.o. male who presents for follow-up with Dr Roland Rack 10/06/21 10 weeks status post a limited arthroscopic debridement, arthroscopic subacromial decompression, arthroscopic distal clavicle excision, mini open rotator cuff repair, and mini-open biceps tenodesis of his left shoulder done 07/25/21. Overall, the patient feels that his shoulder is doing reasonably well. He still notes some soreness in the shoulder for which he will apply ice and take an occasional oxycodone for discomfort, especially associated with  physical therapy. He continues to attend physical therapy, as well as to perform exercises on his own at home and feels that his range of motion and strength continue to improve. He has resumed many of his normal daily activities without difficulty. He does have difficulty sleeping at night, but does not feel that it is due to his shoulder. He denies any recent reinjury to the shoulder, and denies any fevers or chills.    The patient's primary concern today is continued left hand pain and stiffness. He notes that the symptoms have been present since his surgery. They were worse at his last visit, but he notes that the therapist has been working with him and his motion has improved somewhat. However, he still is not yet able to make a fist and has pain with maximal active flexion attempts as well as with passive flexion. He denies any injury to the hand, and denies any numbness or paresthesias to his fingers. refer to OT but then had fx of thumb early Sept - immobilize -refer back to OT    Patient Stated Goals Want my hand and thumb better the swelling, pain and motion /strength so I can take care of myself - toiletting , gripping things and play the bass    Currently in Pain? Yes    Pain Score 3     Pain Location --   thumb   Pain Orientation Left    Pain Descriptors / Indicators Aching;Tightness    Pain Type Acute pain                Patient arrived this date with increased thumb IP pain since yesterday.  Patient report he does not know if he dramas and did something. Patient with increased tenderness and pain at thumb IP joint as well as increase pain with  flexion /extension.  Patient also with a click with increased pain with attempt of IP flexion. Patient wrist flexion extension increased this date with decreased pain over dorsal wrist. Tendon glides done as well as opposition pain-free.  Patient with less shaking and tremors during opposition. Done a coordination activity and dexterity  activity with individual digit pressure simulating cords on his guitar. Patient can do that also with in his thumb spica. Message left with orthopedics for possible x-ray. Patient to go into his thumb spica splint until follow-up or checkup on thumb IP pain.          OT Treatments/Exercises (OP) - 02/01/22 0001       LUE Paraffin   Number Minutes Paraffin 8 Minutes    LUE Paraffin Location Hand    Comments decrease pain in thumb prior to ROM                    OT Education - 02/01/22 1335     Education Details changes HEP    Person(s) Educated Patient    Methods Explanation;Tactile cues;Verbal cues;Handout;Demonstration    Comprehension Verbalized understanding;Returned demonstration;Verbal cues required;Tactile cues required  OT Short Term Goals - 01/09/22 2013       OT SHORT TERM GOAL #1   Title Patient to be independent in home program to decrease L hand and wrist  edema and pain to have increase left wrist and digit range of motion    Baseline Pt return this date after having thumb close non displace fx early Sept - need review again with HEP- show increase edema, pain and decrease ROM again    Time 3    Period Weeks    Status On-going    Target Date 01/30/22               OT Long Term Goals - 01/09/22 2019       OT LONG TERM GOAL #1   Title Left digits active range of motion increased for patient to be able to touch palm to use hand and eating, bathing and dressing with more ease    Baseline decrease Flexion in all digits and thumb -shaking of digits and thumb when attempt to do ROM or use - pain increase in thumb 8/10    Time 5    Period Weeks    Status On-going    Target Date 02/13/22      OT LONG TERM GOAL #2   Title Left wrist active range of motion improved for patient to be able to turn doorknob, push and pull heavy door open without pain    Baseline L wrist AROM cont to be decrease with pain in flexion, ext mostly  8/10 - not using hand / wrist    Time 6    Period Weeks    Status On-going    Target Date 02/20/22      OT LONG TERM GOAL #3   Title L thumb AROM increase to [facility] to do buttons, and be able to hygiene and play guitar without increase symptoms    Baseline decrease thumb AROM in all planes with pain increase 8/10 since non displaced close fx  at thumb L - no using thumb    Time 6    Period Weeks    Status On-going    Target Date 02/20/22      OT LONG TERM GOAL #4   Title Left grip and prehension strength increased to more than 50% compared to the right to return back to prior level of function and ADLs and IADLs    Baseline NT pt return to OT now after non displace close fx of L thumb - pain AROM 8/10 and edema increase    Time 6    Period Weeks    Status On-going    Target Date 02/20/22                   Plan - 02/01/22 1336     Clinical Impression Statement Pt refer to OT s/p limited arthroscopic debridement, arthroscopic subacromial decompression, arthroscopic distal clavicle excision, mini open rotator cuff repair, and mini-open biceps tenodesis of his left shoulder on 07/25/21. Pt is about 5 months s/p.  Pt was seen by this OT prior to early Sept to decrease edema/pain- increase motion - did great progress but then fell and fx his thumb non displace fx- Pt now out more than  2 1/2 months from fx. Pt  made great progress in thumb , digits and wrist  AROM  Also increase prehension and grip strength - but this date pt arrive with increase pain , tenderness and clicking at thumb  IP. Did leave message with Ortho if pt can get xray-and pt to put on his thumb spica until then -cont with wrist and digits AROM - hold off on thumb flexion.   Pt can benefit from skilled OT services to decrease pain with increase ROM and strength in L hand and wrist to return to prior level of function prior to shoulder surgery- pt lives alone - has aid 3 x wk for 1-2 hrs per pt.    OT Occupational Profile  and History Problem Focused Assessment - Including review of records relating to presenting problem    Occupational performance deficits (Please refer to evaluation for details): ADL's;IADL's;Play;Social Participation;Leisure    Body Structure / Function / Physical Skills ADL;Coordination;Flexibility;FMC;IADL;ROM;UE functional use;Edema;Dexterity;Pain;Strength    Rehab Potential Good    Clinical Decision Making Limited treatment options, no task modification necessary    Comorbidities Affecting Occupational Performance: None    Modification or Assistance to Complete Evaluation  No modification of tasks or assist necessary to complete eval    OT Frequency 2x / week    OT Duration 6 weeks    OT Treatment/Interventions Self-care/ADL training;Paraffin;Moist Heat;Fluidtherapy;Contrast Bath;Therapeutic exercise;Manual Therapy;Patient/family education;Passive range of motion;DME and/or AE instruction;Splinting    Consulted and Agree with Plan of Care Patient             Patient will benefit from skilled therapeutic intervention in order to improve the following deficits and impairments:   Body Structure / Function / Physical Skills: ADL, Coordination, Flexibility, FMC, IADL, ROM, UE functional use, Edema, Dexterity, Pain, Strength       Visit Diagnosis: Pain in left hand  Pain in left wrist  Stiffness of left hand, not elsewhere classified  Stiffness of left wrist, not elsewhere classified  Localized edema  Muscle weakness (generalized)    Problem List Patient Active Problem List   Diagnosis Date Noted   History of DVT of lower extremity 12/14/2021   Ileus ([place]) 04/25/2021   Acid reflux    Emphysema of lung ([facility])    S/P umbilical hernia repair, follow-up exam 04/30/2019   Erythrocytosis 04/17/2019   Positive fecal occult blood test    Polyp of colon    Diverticulosis of large intestine without diverticulitis    CLE (columnar lined esophagus)    DOE (dyspnea on exertion)  07/19/2017   Atypical chest pain 07/19/2017   Deep vein thrombosis (DVT) of popliteal vein of right lower extremity ([place]) 07/19/2017   Benign prostatic hyperplasia 06/25/2017   Essential hypertension 06/25/2017   Esophageal dysphagia 01/31/2017   Epigastric pain 01/31/2017   Obesity, unspecified 06/26/2013    Rosalyn Gess, OTR/L,CLT 02/01/2022, 2:11 PM  [facility] [address], [place], [address] Phone: [phone]   Fax:  [phone]  Name: Zachary Sweeney MRN: [id number] Date of Birth: 1945/06/05

## 2022-02-08 ENCOUNTER — Ambulatory Visit: Payer: Medicare Other | Admitting: Occupational Therapy

## 2022-02-08 DIAGNOSIS — M25632 Stiffness of left wrist, not elsewhere classified: Secondary | ICD-10-CM

## 2022-02-08 DIAGNOSIS — M25532 Pain in left wrist: Secondary | ICD-10-CM

## 2022-02-08 DIAGNOSIS — M6281 Muscle weakness (generalized): Secondary | ICD-10-CM

## 2022-02-08 DIAGNOSIS — M79642 Pain in left hand: Secondary | ICD-10-CM | POA: Diagnosis not present

## 2022-02-08 DIAGNOSIS — R600 Localized edema: Secondary | ICD-10-CM

## 2022-02-08 DIAGNOSIS — M25642 Stiffness of left hand, not elsewhere classified: Secondary | ICD-10-CM

## 2022-02-08 NOTE — Therapy (Signed)
[facility] [address] [place], [address] Phone: [phone]   Fax:  [phone]  Occupational Therapy Treatment  Patient Details  Name: Zachary Sweeney MRN: [id number] Date of Birth: 03-09-1945 Referring Provider (OT): DR Poggi   Encounter Date: 02/08/2022   OT End of Session - 02/08/22 1429     Visit Number 10    Number of Visits 12    Date for OT Re-Evaluation 02/20/22    OT Start Time 7619    OT Stop Time 1444    OT Time Calculation (min) 42 min    Activity Tolerance Patient tolerated treatment well    Behavior During Therapy [facility] for tasks assessed/performed             Past Medical History:  Diagnosis Date   Acid reflux    Arthritis    Back injury    Colon polyp    DVT (deep venous thrombosis) ([place])    a. 06/2017 LE U/S: Right popliteal vein DVT.   Dyspnea on exertion    a. 06/2017 CTA Chest: No PE. No significant coronary Ca2+, centrilobular and paraseptal emphysema;  b.  07/2017 Echo: EF 60-65%, no rwma, Gr1 DD, mildly dil LA. Nl RV fxn; c. 07/2017 MV: Small, severe defect involving apical inf and apical segments - only on rest images consistent w/ artifact. No ischemia or scar.   Emphysema of lung ([place])    a. 06/2017 CTA Chest: Centrilobular and paraseptal emphysema, with mild geographic ground-glass opacity likely representing small airway dzs.   Erythrocytosis 04/17/2019   Hypertension    Sleep apnea    Torn ligament     Past Surgical History:  Procedure Laterality Date   COLONOSCOPY  2008   COLONOSCOPY WITH PROPOFOL N/A 02/27/2017   Procedure: COLONOSCOPY WITH PROPOFOL;  Surgeon: Robert Bellow, MD;  Location: [facility] END[facility];  Service: Endoscopy;  Laterality: N/A;   COLONOSCOPY WITH PROPOFOL N/A 07/24/2018   Procedure: COLONOSCOPY WITH PROPOFOL;  Surgeon: Virgel Manifold, MD;  Location: [facility] ENDOSCOPY;  Service: Endoscopy;  Laterality: N/A;   ESOPHAGOGASTRODUODENOSCOPY (EGD)  WITH PROPOFOL N/A 02/27/2017   Procedure: ESOPHAGOGASTRODUODENOSCOPY (EGD) WITH PROPOFOL;  Surgeon: Robert Bellow, MD;  Location: [facility];  Service: Endoscopy;  Laterality: N/A;   ESOPHAGOGASTRODUODENOSCOPY (EGD) WITH PROPOFOL N/A 07/24/2018   Procedure: ESOPHAGOGASTRODUODENOSCOPY (EGD) WITH PROPOFOL;  Surgeon: Virgel Manifold, MD;  Location: [facility] ENDOSCOPY;  Service: Endoscopy;  Laterality: N/A;   HERNIA REPAIR Right    inguinal   INSERTION OF MESH  02/01/2021   Procedure: INSERTION OF MESH;  Surgeon: Herbert Pun, MD;  Location: [facility] ORS;  Service: General;;   KNEE SURGERY Left    NECK SURGERY     ROTATOR CUFF REPAIR Right    SHOULDER ARTHROSCOPY WITH SUBACROMIAL DECOMPRESSION, ROTATOR CUFF REPAIR AND BICEP TENDON REPAIR Left 07/25/2021   Procedure: LEFT SHOULDER ARTHROSCOPY WITH DEBRIDEMENT, DECOMPRESSION, DISTAL CLAVICLE EXCISION, BICEPS TENODESIS;  Surgeon: Corky Mull, MD;  Location: [facility] ORS;  Service: Orthopedics;  Laterality: Left;   UMBILICAL HERNIA REPAIR N/A 04/22/2019   Procedure: HERNIA REPAIR UMBILICAL ADULT;  Surgeon: Ronny Bacon, MD;  Location: [facility] ORS;  Service: General;  Laterality: N/A;    There were no vitals filed for this visit.   Subjective Assessment - 02/08/22 1426     Subjective  I did get xray but don't know the results. THumb still hurts and knuckles - with making fist hurts    Pertinent History  Zachary Sweeney is a 76 y.o. male who presents for follow-up with Dr Roland Rack 10/06/21 10 weeks status post a limited arthroscopic debridement, arthroscopic subacromial decompression, arthroscopic distal clavicle excision, mini open rotator cuff repair, and mini-open biceps tenodesis of his left shoulder done 07/25/21. Overall, the patient feels that his shoulder is doing reasonably well. He still notes some soreness in the shoulder for which he will apply ice and take an occasional oxycodone for discomfort, especially associated with physical therapy.  He continues to attend physical therapy, as well as to perform exercises on his own at home and feels that his range of motion and strength continue to improve. He has resumed many of his normal daily activities without difficulty. He does have difficulty sleeping at night, but does not feel that it is due to his shoulder. He denies any recent reinjury to the shoulder, and denies any fevers or chills.    The patient's primary concern today is continued left hand pain and stiffness. He notes that the symptoms have been present since his surgery. They were worse at his last visit, but he notes that the therapist has been working with him and his motion has improved somewhat. However, he still is not yet able to make a fist and has pain with maximal active flexion attempts as well as with passive flexion. He denies any injury to the hand, and denies any numbness or paresthesias to his fingers. refer to OT but then had fx of thumb early Sept - immobilize -refer back to OT    Patient Stated Goals Want my hand and thumb better the swelling, pain and motion /strength so I can take care of myself - toiletting , gripping things and play the bass    Currently in Pain? Yes    Pain Score 4     Pain Location Hand    Pain Orientation Left    Pain Descriptors / Indicators Aching;Tightness    Pain Type Acute pain    Pain Onset More than a month ago                [facility] OT Assessment - 02/08/22 0001       AROM   Left Wrist Extension 70 Degrees    Left Wrist Flexion 63 Degrees      Left Hand AROM   L Thumb MCP 0-60 40 Degrees    L Thumb IP 0-80 25 Degrees    L Thumb Radial ADduction/ABduction 0-55 52    L Thumb Palmar ADduction/ABduction 0-45 62    L Index  MCP 0-90 65 Degrees    L Index PIP 0-100 90 Degrees    L Long  MCP 0-90 70 Degrees    L Long PIP 0-100 90 Degrees    L Ring  MCP 0-90 75 Degrees    L Ring PIP 0-100 90 Degrees    L Little  MCP 0-90 70 Degrees    L Little PIP 0-100 90 Degrees               Patient arrived with continues thumb IP pain but xray showed some arthritic changes. Patient with increased tenderness and pain at thumb IP joint as well as increase pain with  flexion /extension.  Pt ed on coban wrap for few days to decrease ROM - and contrast   Pt had increase pain and edema over MC's of hand - pt to do contrast , use compression glove and painfree tendon glides ,opposition to digits ,  and wrist flexion, extention  Review with pt to enlarge grips and use palms          OT Treatments/Exercises (OP) - 02/08/22 0001       LUE Contrast Bath   Time 8 minutes    Comments Prior to ROM and soft tissue and decrease pain                    OT Education - 02/08/22 1428     Education Details changes HEP    Person(s) Educated Patient    Methods Explanation;Tactile cues;Verbal cues;Handout;Demonstration    Comprehension Verbalized understanding;Returned demonstration;Verbal cues required;Tactile cues required              OT Short Term Goals - 01/09/22 2013       OT SHORT TERM GOAL #1   Title Patient to be independent in home program to decrease L hand and wrist  edema and pain to have increase left wrist and digit range of motion    Baseline Pt return this date after having thumb close non displace fx early Sept - need review again with HEP- show increase edema, pain and decrease ROM again    Time 3    Period Weeks    Status On-going    Target Date 01/30/22               OT Long Term Goals - 01/09/22 2019       OT LONG TERM GOAL #1   Title Left digits active range of motion increased for patient to be able to touch palm to use hand and eating, bathing and dressing with more ease    Baseline decrease Flexion in all digits and thumb -shaking of digits and thumb when attempt to do ROM or use - pain increase in thumb 8/10    Time 5    Period Weeks    Status On-going    Target Date 02/13/22      OT LONG TERM GOAL #2    Title Left wrist active range of motion improved for patient to be able to turn doorknob, push and pull heavy door open without pain    Baseline L wrist AROM cont to be decrease with pain in flexion, ext mostly 8/10 - not using hand / wrist    Time 6    Period Weeks    Status On-going    Target Date 02/20/22      OT LONG TERM GOAL #3   Title L thumb AROM increase to [facility] to do buttons, and be able to hygiene and play guitar without increase symptoms    Baseline decrease thumb AROM in all planes with pain increase 8/10 since non displaced close fx  at thumb L - no using thumb    Time 6    Period Weeks    Status On-going    Target Date 02/20/22      OT LONG TERM GOAL #4   Title Left grip and prehension strength increased to more than 50% compared to the right to return back to prior level of function and ADLs and IADLs    Baseline NT pt return to OT now after non displace close fx of L thumb - pain AROM 8/10 and edema increase    Time 6    Period Weeks    Status On-going    Target Date 02/20/22  Plan - 02/08/22 1429     Clinical Impression Statement Pt refer to OT s/p limited arthroscopic debridement, arthroscopic subacromial decompression, arthroscopic distal clavicle excision, mini open rotator cuff repair, and mini-open biceps tenodesis of his left shoulder on 07/25/21. Pt is about 5 months s/p.  Pt was seen by this OT prior to early Sept to decrease edema/pain- increase motion - did great progress but then fell and fx his thumb non displace fx- Pt now out more than  2 1/2 months from fx. Pt  made great progress in thumb , digits and wrist  AROM  Also increase prehension and grip strength - but last week had increase pain at thumb IP, tenderness and clicking at thumb IP.  Pt had xray that showed arthritis changes but fracture healed well. Pt had increase edema and pain over MC's at hand - ed pt on doing contrast , compression glove and coban wrap on thumb IP -and  pain free AROM - pt to do for 10 days and will reassess.   Pt can benefit from skilled OT services to decrease pain with increase ROM and strength in L hand and wrist to return to prior level of function prior to shoulder surgery- pt lives alone - has aid 3 x wk for 1-2 hrs per pt.    OT Occupational Profile and History Problem Focused Assessment - Including review of records relating to presenting problem    Occupational performance deficits (Please refer to evaluation for details): ADL's;IADL's;Play;Social Participation;Leisure    Body Structure / Function / Physical Skills ADL;Coordination;Flexibility;FMC;IADL;ROM;UE functional use;Edema;Dexterity;Pain;Strength    Rehab Potential Good    Clinical Decision Making Limited treatment options, no task modification necessary    Comorbidities Affecting Occupational Performance: None    Modification or Assistance to Complete Evaluation  No modification of tasks or assist necessary to complete eval    OT Frequency 2x / week    OT Duration 6 weeks    OT Treatment/Interventions Self-care/ADL training;Paraffin;Moist Heat;Fluidtherapy;Contrast Bath;Therapeutic exercise;Manual Therapy;Patient/family education;Passive range of motion;DME and/or AE instruction;Splinting    Consulted and Agree with Plan of Care Patient             Patient will benefit from skilled therapeutic intervention in order to improve the following deficits and impairments:   Body Structure / Function / Physical Skills: ADL, Coordination, Flexibility, FMC, IADL, ROM, UE functional use, Edema, Dexterity, Pain, Strength       Visit Diagnosis: Pain in left hand  Pain in left wrist  Stiffness of left hand, not elsewhere classified  Stiffness of left wrist, not elsewhere classified  Localized edema  Muscle weakness (generalized)    Problem List Patient Active Problem List   Diagnosis Date Noted   History of DVT of lower extremity 12/14/2021   Ileus ([place]) 04/25/2021    Acid reflux    Emphysema of lung ([facility])    S/P umbilical hernia repair, follow-up exam 04/30/2019   Erythrocytosis 04/17/2019   Positive fecal occult blood test    Polyp of colon    Diverticulosis of large intestine without diverticulitis    CLE (columnar lined esophagus)    DOE (dyspnea on exertion) 07/19/2017   Atypical chest pain 07/19/2017   Deep vein thrombosis (DVT) of popliteal vein of right lower extremity ([facility]) 07/19/2017   Benign prostatic hyperplasia 06/25/2017   Essential hypertension 06/25/2017   Esophageal dysphagia 01/31/2017   Epigastric pain 01/31/2017   Obesity, unspecified 06/26/2013    Rosalyn Gess, OTR/L,CLT 02/08/2022, 6:23 PM  [facility] [address] [place], [address] Phone: [phone]   Fax:  [phone]  Name: Zachary Sweeney MRN: [id number] Date of Birth: Oct 11, 1945

## 2022-02-19 ENCOUNTER — Encounter: Payer: Self-pay | Admitting: Oncology

## 2022-02-20 ENCOUNTER — Ambulatory Visit: Payer: 59 | Attending: Surgery | Admitting: Occupational Therapy

## 2022-02-20 DIAGNOSIS — R600 Localized edema: Secondary | ICD-10-CM | POA: Diagnosis present

## 2022-02-20 DIAGNOSIS — M25642 Stiffness of left hand, not elsewhere classified: Secondary | ICD-10-CM | POA: Insufficient documentation

## 2022-02-20 DIAGNOSIS — M25632 Stiffness of left wrist, not elsewhere classified: Secondary | ICD-10-CM

## 2022-02-20 DIAGNOSIS — M79642 Pain in left hand: Secondary | ICD-10-CM | POA: Insufficient documentation

## 2022-02-20 DIAGNOSIS — M6281 Muscle weakness (generalized): Secondary | ICD-10-CM | POA: Diagnosis present

## 2022-02-20 DIAGNOSIS — M25532 Pain in left wrist: Secondary | ICD-10-CM | POA: Diagnosis present

## 2022-02-20 NOTE — Therapy (Signed)
[facility] [address], [place], [address] Phone: [phone]   Fax:  [phone]  Occupational Therapy Treatment  Patient Details  Name: Zachary Sweeney MRN: [id number] Date of Birth: 1945/03/29 Referring Provider (OT): DR Poggi   Encounter Date: 02/20/2022   OT End of Session - 02/20/22 1319     Visit Number 11    Number of Visits 12    Date for OT Re-Evaluation 02/20/22    OT Start Time 1319    OT Stop Time 1401    OT Time Calculation (min) 42 min    Activity Tolerance Patient tolerated treatment well    Behavior During Therapy [facility] for tasks assessed/performed             Past Medical History:  Diagnosis Date   Acid reflux    Arthritis    Back injury    Colon polyp    DVT (deep venous thrombosis) ([place])    a. 06/2017 LE U/S: Right popliteal vein DVT.   Dyspnea on exertion    a. 06/2017 CTA Chest: No PE. No significant coronary Ca2+, centrilobular and paraseptal emphysema;  b.  07/2017 Echo: EF 60-65%, no rwma, Gr1 DD, mildly dil LA. Nl RV fxn; c. 07/2017 MV: Small, severe defect involving apical inf and apical segments - only on rest images consistent w/ artifact. No ischemia or scar.   Emphysema of lung ([place])    a. 06/2017 CTA Chest: Centrilobular and paraseptal emphysema, with mild geographic ground-glass opacity likely representing small airway dzs.   Erythrocytosis 04/17/2019   Hypertension    Sleep apnea    Torn ligament     Past Surgical History:  Procedure Laterality Date   COLONOSCOPY  2008   COLONOSCOPY WITH PROPOFOL N/A 02/27/2017   Procedure: COLONOSCOPY WITH PROPOFOL;  Surgeon: Robert Bellow, MD;  Location: [facility];  Service: Endoscopy;  Laterality: N/A;   COLONOSCOPY WITH PROPOFOL N/A 07/24/2018   Procedure: COLONOSCOPY WITH PROPOFOL;  Surgeon: Virgel Manifold, MD;  Location: [facility];  Service: Endoscopy;  Laterality: N/A;   ESOPHAGOGASTRODUODENOSCOPY (EGD)  WITH PROPOFOL N/A 02/27/2017   Procedure: ESOPHAGOGASTRODUODENOSCOPY (EGD) WITH PROPOFOL;  Surgeon: Robert Bellow, MD;  Location: [facility];  Service: Endoscopy;  Laterality: N/A;   ESOPHAGOGASTRODUODENOSCOPY (EGD) WITH PROPOFOL N/A 07/24/2018   Procedure: ESOPHAGOGASTRODUODENOSCOPY (EGD) WITH PROPOFOL;  Surgeon: Virgel Manifold, MD;  Location: [facility];  Service: Endoscopy;  Laterality: N/A;   HERNIA REPAIR Right    inguinal   INSERTION OF MESH  02/01/2021   Procedure: INSERTION OF MESH;  Surgeon: Herbert Pun, MD;  Location: [facility];  Service: General;;   KNEE SURGERY Left    NECK SURGERY     ROTATOR CUFF REPAIR Right    SHOULDER ARTHROSCOPY WITH SUBACROMIAL DECOMPRESSION, ROTATOR CUFF REPAIR AND BICEP TENDON REPAIR Left 07/25/2021   Procedure: LEFT SHOULDER ARTHROSCOPY WITH DEBRIDEMENT, DECOMPRESSION, DISTAL CLAVICLE EXCISION, BICEPS TENODESIS;  Surgeon: Corky Mull, MD;  Location: [facility] ORS;  Service: Orthopedics;  Laterality: Left;   UMBILICAL HERNIA REPAIR N/A 04/22/2019   Procedure: HERNIA REPAIR UMBILICAL ADULT;  Surgeon: Ronny Bacon, MD;  Location: [facility] ORS;  Service: General;  Laterality: N/A;    There were no vitals filed for this visit.   Subjective Assessment - 02/20/22 1319     Subjective  Still worries about the pain and shakiness - but can make better fist and wrist moves better -but not able to make tight fist -  using it more and could carry a plate    Pertinent History Zachary Sweeney is a 77 y.o. male who presents for follow-up with Dr Roland Rack 10/06/21 10 weeks status post a limited arthroscopic debridement, arthroscopic subacromial decompression, arthroscopic distal clavicle excision, mini open rotator cuff repair, and mini-open biceps tenodesis of his left shoulder done 07/25/21. Overall, the patient feels that his shoulder is doing reasonably well. He still notes some soreness in the shoulder for which he will apply ice and take an occasional  oxycodone for discomfort, especially associated with physical therapy. He continues to attend physical therapy, as well as to perform exercises on his own at home and feels that his range of motion and strength continue to improve. He has resumed many of his normal daily activities without difficulty. He does have difficulty sleeping at night, but does not feel that it is due to his shoulder. He denies any recent reinjury to the shoulder, and denies any fevers or chills.    The patient's primary concern today is continued left hand pain and stiffness. He notes that the symptoms have been present since his surgery. They were worse at his last visit, but he notes that the therapist has been working with him and his motion has improved somewhat. However, he still is not yet able to make a fist and has pain with maximal active flexion attempts as well as with passive flexion. He denies any injury to the hand, and denies any numbness or paresthesias to his fingers. refer to OT but then had fx of thumb early Sept - immobilize -refer back to OT    Patient Stated Goals Want my hand and thumb better the swelling, pain and motion /strength so I can take care of myself - toiletting , gripping things and play the bass    Currently in Pain? Yes    Pain Score 6     Pain Location Hand    Pain Orientation Left    Pain Descriptors / Indicators Aching;Tightness    Pain Type Acute pain;Chronic pain    Pain Onset More than a month ago    Pain Frequency Intermittent                [facility] OT Assessment - 02/20/22 0001       AROM   Left Wrist Extension 70 Degrees    Left Wrist Flexion 70 Degrees    Left Wrist Radial Deviation 20 Degrees    Left Wrist Ulnar Deviation 30 Degrees      Strength   Left Hand Grip (lbs) 30   pain 4th MC   Left Hand Lateral Pinch 8 lbs   pain   Left Hand 3 Point Pinch 2 lbs   pain     Left Hand AROM   L Thumb MCP 0-60 55 Degrees    L Thumb IP 0-80 38 Degrees    L Thumb Radial  ADduction/ABduction 0-55 55    L Thumb Palmar ADduction/ABduction 0-45 62    L Thumb Opposition to Index --   Opposition to 5th but pain to 5th - pain free to 4th   L Index  MCP 0-90 80 Degrees    L Index PIP 0-100 90 Degrees    L Long  MCP 0-90 75 Degrees    L Long PIP 0-100 95 Degrees    L Ring  MCP 0-90 75 Degrees    L Ring PIP 0-100 90 Degrees    L Little  MCP  0-90 75 Degrees    L Little PIP 0-100 95 Degrees              Patient report decreased pain thumb IP compared to before the holidays.  Show increase thumb flexion and opposition.  With less pain.  Pain still in opposition to fifth. Palmar radial abduction of thumb within normal limits. Patient do show increased tenderness over the A1 pulley of the fourth digit.  With increased stiffness and pain at the fourth PIP. Coming in patient still report increased swelling and pain with a tight fist limited mostly over the metacarpals. Patient doing contrast as well as Isotoner glove. Patient to do ice massage several times during the day over the fourth A1 pulley Avoid tight prolonged grip.        OT Treatments/Exercises (OP) - 02/20/22 0001       Ultrasound   Ultrasound Location L 4th volar A1pulley    Ultrasound Parameters 3.57mz, 20%, 0.8    Ultrasound Goals Pain      LUE Contrast Bath   Time 8 minutes    Comments prior to ROM - decrease pain and stiffness            Reviewed with patient new home program to work on lumbrical metacarpal fist and intrinsic.  10 reps.  Avoid composite grip. Continue with active range of motion of IP and MC flexion of thumb. Prior to opposition to all digits pain-free. Patient to do 2-point pinch to second and third and if pain-free.  With pink medium foam block small. And encourage patient to practice asymmetric strengthening for fingers into thigh simulating playing cortisone bass or guitar No squeezing putty or anything composite fisting. Patient to do several times during  the day ice massage over fourth A1 pulley.        OT Education - 02/20/22 1319     Education Details changes HEP    Person(s) Educated Patient    Methods Explanation;Tactile cues;Verbal cues;Handout;Demonstration    Comprehension Verbalized understanding;Returned demonstration;Verbal cues required;Tactile cues required              OT Short Term Goals - 02/20/22 1724       OT SHORT TERM GOAL #1   Title Patient to be independent in home program to decrease L hand and wrist  edema and pain to have increase left wrist and digit range of motion    Baseline Pt return this date after having thumb close non displace fx early Sept - need review again with HEP- show increase edema, pain and decrease ROM again NOW pt had increase pain in thumb IP few wks again and now show tenderness over A1pulley of 4th and stiff PIP - no triggering    Time 4    Period Weeks    Status On-going    Target Date 03/20/22               OT Long Term Goals - 02/20/22 1725       OT LONG TERM GOAL #1   Title Left digits active range of motion increased for patient to be able to touch palm to use hand and eating, bathing and dressing with more ease    Baseline decrease Flexion in all digits and thumb -shaking of digits and thumb when attempt to do ROM or use - pain increase in thumb 8/10  - NOW pain 6/10  tight fist can touch palm -using it more  - edema over MC's and tenderness  over A1pulley of 4th - stiffness in 4th PIP - last 2-3 wks    Time 6    Period Weeks    Status On-going    Target Date 04/03/22      OT LONG TERM GOAL #2   Title Left wrist active range of motion improved for patient to be able to turn doorknob, push and pull heavy door open without pain    Baseline L wrist AROM cont to be decrease with pain in flexion, ext mostly 8/10 - not using hand / wrist  - NOW wrist improve greatly to 70 flexion, ext - using ir more but stil pain end range flexion of wrist - dorsal wrist    Time 6     Period Weeks    Status On-going    Target Date 04/03/22      OT LONG TERM GOAL #3   Title L thumb AROM increase to [facility] to do buttons, and be able to hygiene and play guitar without increase symptoms    Baseline decrease thumb AROM in all planes with pain increase 8/10 since non displaced close fx  at thumb L - no using thumb  NOW start to use it but had increase pain at IP 3 wks ago -improving this date can do light 2 point pinch to 2nd thru 4th digit - pain wiht prehension    Time 6    Period Weeks    Status On-going    Target Date 04/03/22      OT LONG TERM GOAL #4   Title Left grip and prehension strength increased to more than 50% compared to the right to return back to prior level of function and ADLs and IADLs    Baseline NT pt return to OT now after non displace close fx of L thumb - pain AROM 8/10 and edema increase NOW pain - just stared carry empty plate and pinch softly to 2nd and 3rd    Time 6    Period Weeks    Status On-going    Target Date 04/03/22                   Plan - 02/20/22 1320     OT Occupational Profile and History Problem Focused Assessment - Including review of records relating to presenting problem    Occupational performance deficits (Please refer to evaluation for details): ADL's;IADL's;Play;Social Participation;Leisure    Body Structure / Function / Physical Skills ADL;Coordination;Flexibility;FMC;IADL;ROM;UE functional use;Edema;Dexterity;Pain;Strength    Rehab Potential Good    Clinical Decision Making Limited treatment options, no task modification necessary    Comorbidities Affecting Occupational Performance: None    Modification or Assistance to Complete Evaluation  No modification of tasks or assist necessary to complete eval    OT Frequency 2x / week    OT Duration 6 weeks    OT Treatment/Interventions Self-care/ADL training;Paraffin;Moist Heat;Fluidtherapy;Contrast Bath;Therapeutic exercise;Manual Therapy;Patient/family  education;Passive range of motion;DME and/or AE instruction;Splinting    Consulted and Agree with Plan of Care Patient             Patient will benefit from skilled therapeutic intervention in order to improve the following deficits and impairments:   Body Structure / Function / Physical Skills: ADL, Coordination, Flexibility, FMC, IADL, ROM, UE functional use, Edema, Dexterity, Pain, Strength       Visit Diagnosis: Pain in left hand  Pain in left wrist  Stiffness of left hand, not elsewhere classified  Stiffness of left wrist, not elsewhere classified  Localized edema  Muscle weakness (generalized)    Problem List Patient Active Problem List   Diagnosis Date Noted   History of DVT of lower extremity 12/14/2021   Ileus ([place]) 04/25/2021   Acid reflux    Emphysema of lung ([facility])    S/P umbilical hernia repair, follow-up exam 04/30/2019   Erythrocytosis 04/17/2019   Positive fecal occult blood test    Polyp of colon    Diverticulosis of large intestine without diverticulitis    CLE (columnar lined esophagus)    DOE (dyspnea on exertion) 07/19/2017   Atypical chest pain 07/19/2017   Deep vein thrombosis (DVT) of popliteal vein of right lower extremity ([place]) 07/19/2017   Benign prostatic hyperplasia 06/25/2017   Essential hypertension 06/25/2017   Esophageal dysphagia 01/31/2017   Epigastric pain 01/31/2017   Obesity, unspecified 06/26/2013    Rosalyn Gess, OTR/L,CLT 02/20/2022, 5:30 PM  [facility] [address], [place], [address] Phone: [phone]   Fax:  [phone]  Name: Zachary Sweeney MRN: [id number] Date of Birth: 03/19/1945

## 2022-02-22 ENCOUNTER — Ambulatory Visit: Payer: 59 | Admitting: Occupational Therapy

## 2022-02-22 DIAGNOSIS — M79642 Pain in left hand: Secondary | ICD-10-CM

## 2022-02-22 DIAGNOSIS — M25632 Stiffness of left wrist, not elsewhere classified: Secondary | ICD-10-CM

## 2022-02-22 DIAGNOSIS — M25642 Stiffness of left hand, not elsewhere classified: Secondary | ICD-10-CM

## 2022-02-22 DIAGNOSIS — M6281 Muscle weakness (generalized): Secondary | ICD-10-CM

## 2022-02-22 DIAGNOSIS — R600 Localized edema: Secondary | ICD-10-CM

## 2022-02-22 DIAGNOSIS — M25532 Pain in left wrist: Secondary | ICD-10-CM

## 2022-02-22 NOTE — Therapy (Signed)
[facility] [address], [place], [address] Phone: [phone]   Fax:  [phone]  Occupational Therapy Treatment  Patient Details  Name: Zachary Sweeney MRN: [id number] Date of Birth: 1945/11/17 Referring Provider (OT): DR Poggi   Encounter Date: 02/22/2022   OT End of Session - 02/22/22 1451     Visit Number 12    Number of Visits 20    Date for OT Re-Evaluation 04/03/22    OT Start Time 1431    OT Stop Time 1515    OT Time Calculation (min) 44 min    Activity Tolerance Patient tolerated treatment well    Behavior During Therapy Zachary Sweeney for tasks assessed/performed             Past Medical History:  Diagnosis Date   Acid reflux    Arthritis    Back injury    Colon polyp    DVT (deep venous thrombosis) ([place])    a. 06/2017 LE U/S: Right popliteal vein DVT.   Dyspnea on exertion    a. 06/2017 CTA Chest: No PE. No significant coronary Ca2+, centrilobular and paraseptal emphysema;  b.  07/2017 Echo: EF 60-65%, no rwma, Gr1 DD, mildly dil LA. Nl RV fxn; c. 07/2017 MV: Small, severe defect involving apical inf and apical segments - only on rest images consistent w/ artifact. No ischemia or scar.   Emphysema of lung ([place])    a. 06/2017 CTA Chest: Centrilobular and paraseptal emphysema, with mild geographic ground-glass opacity likely representing small airway dzs.   Erythrocytosis 04/17/2019   Hypertension    Sleep apnea    Torn ligament     Past Surgical History:  Procedure Laterality Date   COLONOSCOPY  2008   COLONOSCOPY WITH PROPOFOL N/A 02/27/2017   Procedure: COLONOSCOPY WITH PROPOFOL;  Surgeon: Robert Bellow, MD;  Location: [facility];  Service: Endoscopy;  Laterality: N/A;   COLONOSCOPY WITH PROPOFOL N/A 07/24/2018   Procedure: COLONOSCOPY WITH PROPOFOL;  Surgeon: Virgel Manifold, MD;  Location: [facility];  Service: Endoscopy;  Laterality: N/A;   ESOPHAGOGASTRODUODENOSCOPY (EGD)  WITH PROPOFOL N/A 02/27/2017   Procedure: ESOPHAGOGASTRODUODENOSCOPY (EGD) WITH PROPOFOL;  Surgeon: Robert Bellow, MD;  Location: [facility];  Service: Endoscopy;  Laterality: N/A;   ESOPHAGOGASTRODUODENOSCOPY (EGD) WITH PROPOFOL N/A 07/24/2018   Procedure: ESOPHAGOGASTRODUODENOSCOPY (EGD) WITH PROPOFOL;  Surgeon: Virgel Manifold, MD;  Location: [facility];  Service: Endoscopy;  Laterality: N/A;   HERNIA REPAIR Right    inguinal   INSERTION OF MESH  02/01/2021   Procedure: INSERTION OF MESH;  Surgeon: Herbert Pun, MD;  Location: [facility];  Service: General;;   KNEE SURGERY Left    NECK SURGERY     ROTATOR CUFF REPAIR Right    SHOULDER ARTHROSCOPY WITH SUBACROMIAL DECOMPRESSION, ROTATOR CUFF REPAIR AND BICEP TENDON REPAIR Left 07/25/2021   Procedure: LEFT SHOULDER ARTHROSCOPY WITH DEBRIDEMENT, DECOMPRESSION, DISTAL CLAVICLE EXCISION, BICEPS TENODESIS;  Surgeon: Corky Mull, MD;  Location: [facility];  Service: Orthopedics;  Laterality: Left;   UMBILICAL HERNIA REPAIR N/A 04/22/2019   Procedure: HERNIA REPAIR UMBILICAL ADULT;  Surgeon: Ronny Bacon, MD;  Location: [facility];  Service: General;  Laterality: N/A;    There were no vitals filed for this visit.   Subjective Assessment - 02/22/22 1448     Subjective  Patient report doing his home program since last time.  Doing asymmetric individual finger extension exercises into side avoiding tight prolonged grip.  Thumb  continues to improve and pain.  Next PT appointment next week.    Pertinent History Zachary Sweeney is a 77 y.o. male who presents for follow-up with Dr Roland Rack 10/06/21 10 weeks status post a limited arthroscopic debridement, arthroscopic subacromial decompression, arthroscopic distal clavicle excision, mini open rotator cuff repair, and mini-open biceps tenodesis of his left shoulder done 07/25/21. Overall, the patient feels that his shoulder is doing reasonably well. He still notes some soreness in the shoulder  for which he will apply ice and take an occasional oxycodone for discomfort, especially associated with physical therapy. He continues to attend physical therapy, as well as to perform exercises on his own at home and feels that his range of motion and strength continue to improve. He has resumed many of his normal daily activities without difficulty. He does have difficulty sleeping at night, but does not feel that it is due to his shoulder. He denies any recent reinjury to the shoulder, and denies any fevers or chills.    The patient's primary concern today is continued left hand pain and stiffness. He notes that the symptoms have been present since his surgery. They were worse at his last visit, but he notes that the therapist has been working with him and his motion has improved somewhat. However, he still is not yet able to make a fist and has pain with maximal active flexion attempts as well as with passive flexion. He denies any injury to the hand, and denies any numbness or paresthesias to his fingers. refer to OT but then had fx of thumb early Sept - immobilize -refer back to OT    Patient Stated Goals Want my hand and thumb better the swelling, pain and motion /strength so I can take care of myself - toiletting , gripping things and play the bass    Currently in Pain? Yes    Pain Score 6     Pain Location --   A1pulley 4th   Pain Orientation Left    Pain Descriptors / Indicators Tightness;Sore    Pain Type Acute pain;Chronic pain                   Patient arrived again this date with reports of decreased pain in the thumb.  Able to do active range of motion opposition to all digits. But continues to show increased stiffness and pain in the fourth PIP as well as discomfort and pain with composite fist. Tenderness over the A1 pulley of the fourth worse than the third. No triggering.       OT Treatments/Exercises (OP) - 02/22/22 0001       Ultrasound   Ultrasound Location L  4th volar A1pulley    Ultrasound Parameters 3.27mz, 20%, 0.8    Ultrasound Goals Pain      LUE Contrast Bath   Time 8 minutes    Comments decreasepain and edema - prior to ROM and soft tissue               Reviewed with patient new home program this week to work on lumbrical metacarpal fist and intrinsic.  10 reps.  Avoid composite grip. Continue with active range of motion of IP and MC flexion of thumb. Prior to opposition to all digits pain-free. Patient to do 2-point pinch to second and third and if pain-free.  With pink medium foam block small. And encourage patient to practice asymmetric strengthening for fingers into thigh simulating playing cortisone bass or guitar No  squeezing putty or anything composite fisting. Patient to do several times during the day ice massage over fourth A1 pulley. Done Rhona Leavens #2 tool for sweeping over dorsal wrist and forearm as well as radial prior to wrist active range of motion Gentle traction and joint mobs to wrist prior to pain-free active range of motion for wrist flexion extension. Followed by 1 pound weight for wrist in all planes pain-free including supination pronation 10 reps. Patient to do at home if possible 1 pound or 16 ounce hammer for wrist and forearm 10 reps pain-free but enlarged grip.     OT Education - 02/22/22 1450     Education Details changes HEP    Person(s) Educated Patient    Methods Explanation;Tactile cues;Verbal cues;Handout;Demonstration    Comprehension Verbalized understanding;Returned demonstration;Verbal cues required;Tactile cues required              OT Short Term Goals - 02/20/22 1724       OT SHORT TERM GOAL #1   Title Patient to be independent in home program to decrease L hand and wrist  edema and pain to have increase left wrist and digit range of motion    Baseline Pt return this date after having thumb close non displace fx early Sept - need review again with HEP- show increase edema,  pain and decrease ROM again NOW pt had increase pain in thumb IP few wks again and now show tenderness over A1pulley of 4th and stiff PIP - no triggering    Time 4    Period Weeks    Status On-going    Target Date 03/20/22               OT Long Term Goals - 02/20/22 1725       OT LONG TERM GOAL #1   Title Left digits active range of motion increased for patient to be able to touch palm to use hand and eating, bathing and dressing with more ease    Baseline decrease Flexion in all digits and thumb -shaking of digits and thumb when attempt to do ROM or use - pain increase in thumb 8/10  - NOW pain 6/10  tight fist can touch palm -using it more  - edema over MC's and tenderness over A1pulley of 4th - stiffness in 4th PIP - last 2-3 wks    Time 6    Period Weeks    Status On-going    Target Date 04/03/22      OT LONG TERM GOAL #2   Title Left wrist active range of motion improved for patient to be able to turn doorknob, push and pull heavy door open without pain    Baseline L wrist AROM cont to be decrease with pain in flexion, ext mostly 8/10 - not using hand / wrist  - NOW wrist improve greatly to 70 flexion, ext - using ir more but stil pain end range flexion of wrist - dorsal wrist    Time 6    Period Weeks    Status On-going    Target Date 04/03/22      OT LONG TERM GOAL #3   Title L thumb AROM increase to [facility] to do buttons, and be able to hygiene and play guitar without increase symptoms    Baseline decrease thumb AROM in all planes with pain increase 8/10 since non displaced close fx  at thumb L - no using thumb  NOW start to use it but had increase pain  at IP 3 wks ago -improving this date can do light 2 point pinch to 2nd thru 4th digit - pain wiht prehension    Time 6    Period Weeks    Status On-going    Target Date 04/03/22      OT LONG TERM GOAL #4   Title Left grip and prehension strength increased to more than 50% compared to the right to return back to prior level  of function and ADLs and IADLs    Baseline NT pt return to OT now after non displace close fx of L thumb - pain AROM 8/10 and edema increase NOW pain - just stared carry empty plate and pinch softly to 2nd and 3rd    Time 6    Period Weeks    Status On-going    Target Date 04/03/22                   Plan - 02/22/22 1453     Clinical Impression Statement Pt refer to OT s/p limited arthroscopic debridement, arthroscopic subacromial decompression, arthroscopic distal clavicle excision, mini open rotator cuff repair, and mini-open biceps tenodesis of his left shoulder on 07/25/21. Pt is about 7 months s/p.  Pt was seen by this OT prior to early Sept to decrease edema/pain- increase motion - did great progress but then fell and fx his thumb non displace fx- Pt now out more than  3 1/2 months from fx. Pt  made great progress in thumb , digits and wrist  AROM  Also increase prehension and grip strength - but  had 2-3 wks ago increase pain at thumb IP, tenderness and clicking at thumb IP.  Pt had xray that showed arthritis changes but fracture healed well. Pt this week with less pain and increase motion - and able to start light strengthening - pt to talk to PT about avoiding grippping tight with hand and thumb with shoulder strengthening. This week pt present with pain and stiffness over 4th digits- but no triggering. P to do contrast , compression glove and coban wrap on thumb IP if needed, AROM pain free to thumb and avoid tight grip and ice massage over 4th A1pulley.  Pt can benefit from skilled OT services to decrease pain with increase ROM and strength in L hand and wrist to return to prior level of function prior to shoulder surgery- pt lives alone - has aid 3 x wk for 1-2 hrs per pt.    OT Occupational Profile and History Problem Focused Assessment - Including review of records relating to presenting problem    Occupational performance deficits (Please refer to evaluation for details):  ADL's;IADL's;Play;Social Participation;Leisure    Body Structure / Function / Physical Skills ADL;Coordination;Flexibility;FMC;IADL;ROM;UE functional use;Edema;Dexterity;Pain;Strength    Rehab Potential Good    Clinical Decision Making Limited treatment options, no task modification necessary    Comorbidities Affecting Occupational Performance: None    Modification or Assistance to Complete Evaluation  No modification of tasks or assist necessary to complete eval    OT Frequency 2x / week    OT Duration 6 weeks    OT Treatment/Interventions Self-care/ADL training;Paraffin;Moist Heat;Fluidtherapy;Contrast Bath;Therapeutic exercise;Manual Therapy;Patient/family education;Passive range of motion;DME and/or AE instruction;Splinting    Consulted and Agree with Plan of Care Patient             Patient will benefit from skilled therapeutic intervention in order to improve the following deficits and impairments:   Body Structure / Function / Physical Skills:  ADL, Coordination, Flexibility, FMC, IADL, ROM, UE functional use, Edema, Dexterity, Pain, Strength       Visit Diagnosis: Pain in left hand  Pain in left wrist  Stiffness of left hand, not elsewhere classified  Stiffness of left wrist, not elsewhere classified  Localized edema  Muscle weakness (generalized)    Problem List Patient Active Problem List   Diagnosis Date Noted   History of DVT of lower extremity 12/14/2021   Ileus ([place]) 04/25/2021   Acid reflux    Emphysema of lung ([facility])    S/P umbilical hernia repair, follow-up exam 04/30/2019   Erythrocytosis 04/17/2019   Positive fecal occult blood test    Polyp of colon    Diverticulosis of large intestine without diverticulitis    CLE (columnar lined esophagus)    DOE (dyspnea on exertion) 07/19/2017   Atypical chest pain 07/19/2017   Deep vein thrombosis (DVT) of popliteal vein of right lower extremity ([place]) 07/19/2017   Benign prostatic hyperplasia 06/25/2017    Essential hypertension 06/25/2017   Esophageal dysphagia 01/31/2017   Epigastric pain 01/31/2017   Obesity, unspecified 06/26/2013    Rosalyn Gess, OTR/L,CLT 02/22/2022, 6:41 PM  [facility] [address] [place], [address] Phone: [phone]   Fax:  [phone]  Name: Zachary Sweeney MRN: [id number] Date of Birth: [birth year]

## 2022-02-26 ENCOUNTER — Ambulatory Visit: Payer: 59 | Admitting: Occupational Therapy

## 2022-02-28 ENCOUNTER — Ambulatory Visit: Payer: 59 | Admitting: Occupational Therapy

## 2022-03-12 ENCOUNTER — Encounter: Payer: Self-pay | Admitting: Oncology

## 2022-03-15 ENCOUNTER — Ambulatory Visit: Payer: 59 | Admitting: Occupational Therapy

## 2022-03-15 ENCOUNTER — Encounter: Payer: Self-pay | Admitting: Oncology

## 2022-03-15 DIAGNOSIS — M25642 Stiffness of left hand, not elsewhere classified: Secondary | ICD-10-CM

## 2022-03-15 DIAGNOSIS — M79642 Pain in left hand: Secondary | ICD-10-CM | POA: Diagnosis not present

## 2022-03-15 DIAGNOSIS — R600 Localized edema: Secondary | ICD-10-CM

## 2022-03-15 DIAGNOSIS — M25532 Pain in left wrist: Secondary | ICD-10-CM

## 2022-03-15 DIAGNOSIS — M6281 Muscle weakness (generalized): Secondary | ICD-10-CM

## 2022-03-15 DIAGNOSIS — M25632 Stiffness of left wrist, not elsewhere classified: Secondary | ICD-10-CM

## 2022-03-15 NOTE — Therapy (Signed)
[facility] [address] [place], [address] Phone: [phone]   Fax:  [phone]  Occupational Therapy Treatment  Patient Details  Name: Zachary Sweeney MRN: [id number] Date of Birth: 08-28-1945 Referring Provider (OT): DR Poggi   Encounter Date: 03/15/2022   OT End of Session - 03/15/22 1439     Visit Number 13    Number of Visits 20    Date for OT Re-Evaluation 04/03/22    OT Start Time 7824    OT Stop Time 1607    OT Time Calculation (min) 40 min    Activity Tolerance Patient tolerated treatment well    Behavior During Therapy [facility] for tasks assessed/performed             Past Medical History:  Diagnosis Date   Acid reflux    Arthritis    Back injury    Colon polyp    DVT (deep venous thrombosis) ([place])    a. 06/2017 LE U/S: Right popliteal vein DVT.   Dyspnea on exertion    a. 06/2017 CTA Chest: No PE. No significant coronary Ca2+, centrilobular and paraseptal emphysema;  b.  07/2017 Echo: EF 60-65%, no rwma, Gr1 DD, mildly dil LA. Nl RV fxn; c. 07/2017 MV: Small, severe defect involving apical inf and apical segments - only on rest images consistent w/ artifact. No ischemia or scar.   Emphysema of lung ([place])    a. 06/2017 CTA Chest: Centrilobular and paraseptal emphysema, with mild geographic ground-glass opacity likely representing small airway dzs.   Erythrocytosis 04/17/2019   Hypertension    Sleep apnea    Torn ligament     Past Surgical History:  Procedure Laterality Date   COLONOSCOPY  2008   COLONOSCOPY WITH PROPOFOL N/A 02/27/2017   Procedure: COLONOSCOPY WITH PROPOFOL;  Surgeon: Robert Bellow, MD;  Location: [facility];  Service: Endoscopy;  Laterality: N/A;   COLONOSCOPY WITH PROPOFOL N/A 07/24/2018   Procedure: COLONOSCOPY WITH PROPOFOL;  Surgeon: Virgel Manifold, MD;  Location: [facility];  Service: Endoscopy;  Laterality: N/A;   ESOPHAGOGASTRODUODENOSCOPY (EGD) WITH  PROPOFOL N/A 02/27/2017   Procedure: ESOPHAGOGASTRODUODENOSCOPY (EGD) WITH PROPOFOL;  Surgeon: Robert Bellow, MD;  Location: [facility];  Service: Endoscopy;  Laterality: N/A;   ESOPHAGOGASTRODUODENOSCOPY (EGD) WITH PROPOFOL N/A 07/24/2018   Procedure: ESOPHAGOGASTRODUODENOSCOPY (EGD) WITH PROPOFOL;  Surgeon: Virgel Manifold, MD;  Location: [facility];  Service: Endoscopy;  Laterality: N/A;   HERNIA REPAIR Right    inguinal   INSERTION OF MESH  02/01/2021   Procedure: INSERTION OF MESH;  Surgeon: Herbert Pun, MD;  Location: [facility];  Service: General;;   KNEE SURGERY Left    NECK SURGERY     ROTATOR CUFF REPAIR Right    SHOULDER ARTHROSCOPY WITH SUBACROMIAL DECOMPRESSION, ROTATOR CUFF REPAIR AND BICEP TENDON REPAIR Left 07/25/2021   Procedure: LEFT SHOULDER ARTHROSCOPY WITH DEBRIDEMENT, DECOMPRESSION, DISTAL CLAVICLE EXCISION, BICEPS TENODESIS;  Surgeon: Corky Mull, MD;  Location: [facility] OR[facility];  Service: Orthopedics;  Laterality: Left;   UMBILICAL HERNIA REPAIR N/A 04/22/2019   Procedure: HERNIA REPAIR UMBILICAL ADULT;  Surgeon: Ronny Bacon, MD;  Location: [facility];  Service: General;  Laterality: N/A;    There were no vitals filed for this visit.   Subjective Assessment - 03/15/22 1439     Subjective  So the physical therapist said that need to get my hand stronger and pain-free so that she can do more strengthening.  My thumb feels  better and my hand to.  Pain is better.  I feeling better- I had COVID.    Pertinent History Zachary Sweeney is a 77 y.o. male who presents for follow-up with Dr Roland Rack 10/06/21 10 weeks status post a limited arthroscopic debridement, arthroscopic subacromial decompression, arthroscopic distal clavicle excision, mini open rotator cuff repair, and mini-open biceps tenodesis of his left shoulder done 07/25/21. Overall, the patient feels that his shoulder is doing reasonably well. He still notes some soreness in the shoulder for which he will  apply ice and take an occasional oxycodone for discomfort, especially associated with physical therapy. He continues to attend physical therapy, as well as to perform exercises on his own at home and feels that his range of motion and strength continue to improve. He has resumed many of his normal daily activities without difficulty. He does have difficulty sleeping at night, but does not feel that it is due to his shoulder. He denies any recent reinjury to the shoulder, and denies any fevers or chills.    The patient's primary concern today is continued left hand pain and stiffness. He notes that the symptoms have been present since his surgery. They were worse at his last visit, but he notes that the therapist has been working with him and his motion has improved somewhat. However, he still is not yet able to make a fist and has pain with maximal active flexion attempts as well as with passive flexion. He denies any injury to the hand, and denies any numbness or paresthesias to his fingers. refer to OT but then had fx of thumb early Sept - immobilize -refer back to OT    Patient Stated Goals Want my hand and thumb better the swelling, pain and motion /strength so I can take care of myself - toiletting , gripping things and play the bass    Currently in Pain? No/denies                [facility] OT Assessment - 03/15/22 0001       Strength   Left Hand Grip (lbs) 28      Left Hand AROM   L Thumb MCP 0-60 55 Degrees    L Thumb IP 0-80 45 Degrees    L Thumb Radial ADduction/ABduction 0-55 55    L Thumb Palmar ADduction/ABduction 0-45 68    L Index  MCP 0-90 70 Degrees    L Long  MCP 0-90 80 Degrees    L Ring  MCP 0-90 85 Degrees    L Little  MCP 0-90 90 Degrees                         Patient arrived after not being seen for 3 weeks.  Patient had COVID.  Patient's pain in fourth digit on the left hand as well as thumb IP improved with increase flexion in all digits at MCP as well  as thumb. Provided patient with pinching during opposition into green small foam block pain-free. Clothing pain of 2 pounds for lateral pinch and 3-point pinch pain-free.  10 reps.  Patient done better with [place] around IP as well as putting pressure proximal to IP. Patient to hold off on any grip strengthening. Continues to have some stiffness in the fourth digit PIP and pain 4/10 tenderness over the fourth A1 pulley. Patient to focus on intrinsic a fist prior to active assisted range of motion composite flexion 10 reps With neoprene  splint on patient can do 3 pound weight for supination pronation as well as radial ulnar deviation 10 reps pain-free. Patient to do home program twice a day pain-free can do contrast prior.    OT Education - 03/15/22 1439     Education Details changes HEP    Person(s) Educated Patient    Methods Explanation;Tactile cues;Verbal cues;Handout;Demonstration    Comprehension Verbalized understanding;Returned demonstration;Verbal cues required;Tactile cues required              OT Short Term Goals - 02/20/22 1724       OT SHORT TERM GOAL #1   Title Patient to be independent in home program to decrease L hand and wrist  edema and pain to have increase left wrist and digit range of motion    Baseline Pt return this date after having thumb close non displace fx early Sept - need review again with HEP- show increase edema, pain and decrease ROM again NOW pt had increase pain in thumb IP few wks again and now show tenderness over A1pulley of 4th and stiff PIP - no triggering    Time 4    Period Weeks    Status On-going    Target Date 03/20/22               OT Long Term Goals - 02/20/22 1725       OT LONG TERM GOAL #1   Title Left digits active range of motion increased for patient to be able to touch palm to use hand and eating, bathing and dressing with more ease    Baseline decrease Flexion in all digits and thumb -shaking of digits and thumb when  attempt to do ROM or use - pain increase in thumb 8/10  - NOW pain 6/10  tight fist can touch palm -using it more  - edema over MC's and tenderness over A1pulley of 4th - stiffness in 4th PIP - last 2-3 wks    Time 6    Period Weeks    Status On-going    Target Date 04/03/22      OT LONG TERM GOAL #2   Title Left wrist active range of motion improved for patient to be able to turn doorknob, push and pull heavy door open without pain    Baseline L wrist AROM cont to be decrease with pain in flexion, ext mostly 8/10 - not using hand / wrist  - NOW wrist improve greatly to 70 flexion, ext - using ir more but stil pain end range flexion of wrist - dorsal wrist    Time 6    Period Weeks    Status On-going    Target Date 04/03/22      OT LONG TERM GOAL #3   Title L thumb AROM increase to [facility] to do buttons, and be able to hygiene and play guitar without increase symptoms    Baseline decrease thumb AROM in all planes with pain increase 8/10 since non displaced close fx  at thumb L - no using thumb  NOW start to use it but had increase pain at IP 3 wks ago -improving this date can do light 2 point pinch to 2nd thru 4th digit - pain wiht prehension    Time 6    Period Weeks    Status On-going    Target Date 04/03/22      OT LONG TERM GOAL #4   Title Left grip and prehension strength increased to more than 50%  compared to the right to return back to prior level of function and ADLs and IADLs    Baseline NT pt return to OT now after non displace close fx of L thumb - pain AROM 8/10 and edema increase NOW pain - just stared carry empty plate and pinch softly to 2nd and 3rd    Time 6    Period Weeks    Status On-going    Target Date 04/03/22                   Plan - 03/15/22 1439     Clinical Impression Statement Pt refer to OT s/p limited arthroscopic debridement, arthroscopic subacromial decompression, arthroscopic distal clavicle excision, mini open rotator cuff repair, and mini-open  biceps tenodesis of his left shoulder on 07/25/21. Pt is about 7 months s/p.  Pt was seen by this OT prior to early Sept to decrease edema/pain- increase motion - did great progress but then fell and fx his thumb non displace fx- Pt now out more than  4 months from fx. Pt  made great progress in thumb , digits and wrist  AROM  Also increase prehension and grip strength - but  had few wks ago increase pain at thumb IP, tenderness and clicking at thumb IP.  Pt had xray that showed arthritis changes but fracture healed well. Pt was not seen for about 3 wks - pt had Covid - return today with increase AROM in L hand and thumb with less pain -but pain mostly when doing 3 poin and lat pinch using IP of thumb. Still decrease PIP flexion of 4th digit and tenderness over A1pulley at 4th  Pt cont better if pinching with pressure proximal to IP of thumb. Provided pt some light prehension strenght and opposition pinching HEP and 3 lbs for wrist sup/pro and RD, UD with neoprene splint on.  Pt can benefit from skilled OT services to decrease pain with increase ROM and strength in L hand and wrist to return to prior level of function prior to shoulder surgery- pt lives alone - has aid 3 x wk for 1-2 hrs per pt.    OT Occupational Profile and History Problem Focused Assessment - Including review of records relating to presenting problem    Occupational performance deficits (Please refer to evaluation for details): ADL's;IADL's;Play;Social Participation;Leisure    Body Structure / Function / Physical Skills ADL;Coordination;Flexibility;FMC;IADL;ROM;UE functional use;Edema;Dexterity;Pain;Strength    Rehab Potential Good    Clinical Decision Making Limited treatment options, no task modification necessary    Comorbidities Affecting Occupational Performance: None    Modification or Assistance to Complete Evaluation  No modification of tasks or assist necessary to complete eval    OT Frequency 1x / week    OT Duration 6 weeks     OT Treatment/Interventions Self-care/ADL training;Paraffin;Moist Heat;Fluidtherapy;Contrast Bath;Therapeutic exercise;Manual Therapy;Patient/family education;Passive range of motion;DME and/or AE instruction;Splinting    Consulted and Agree with Plan of Care Patient             Patient will benefit from skilled therapeutic intervention in order to improve the following deficits and impairments:   Body Structure / Function / Physical Skills: ADL, Coordination, Flexibility, FMC, IADL, ROM, UE functional use, Edema, Dexterity, Pain, Strength       Visit Diagnosis: Pain in left hand  Pain in left wrist  Stiffness of left hand, not elsewhere classified  Stiffness of left wrist, not elsewhere classified  Localized edema  Muscle weakness (generalized)  Problem List Patient Active Problem List   Diagnosis Date Noted   History of DVT of lower extremity 12/14/2021   Ileus ([place]) 04/25/2021   Acid reflux    Emphysema of lung ([facility])    S/P umbilical hernia repair, follow-up exam 04/30/2019   Erythrocytosis 04/17/2019   Positive fecal occult blood test    Polyp of colon    Diverticulosis of large intestine without diverticulitis    CLE (columnar lined esophagus)    DOE (dyspnea on exertion) 07/19/2017   Atypical chest pain 07/19/2017   Deep vein thrombosis (DVT) of popliteal vein of right lower extremity ([place]) 07/19/2017   Benign prostatic hyperplasia 06/25/2017   Essential hypertension 06/25/2017   Esophageal dysphagia 01/31/2017   Epigastric pain 01/31/2017   Obesity, unspecified 06/26/2013    Rosalyn Gess, OTR/L,CLT 03/15/2022, 5:38 PM  [facility] [address] [place], [address] Phone: [phone]   Fax:  [phone]  Name: CAYSEN WHANG MRN: [id number] Date of Birth: 1945-03-17

## 2022-03-22 ENCOUNTER — Ambulatory Visit: Payer: 59 | Attending: Surgery | Admitting: Occupational Therapy

## 2022-03-22 ENCOUNTER — Encounter: Payer: Self-pay | Admitting: Occupational Therapy

## 2022-03-22 DIAGNOSIS — M25642 Stiffness of left hand, not elsewhere classified: Secondary | ICD-10-CM | POA: Diagnosis present

## 2022-03-22 DIAGNOSIS — M25632 Stiffness of left wrist, not elsewhere classified: Secondary | ICD-10-CM | POA: Insufficient documentation

## 2022-03-22 DIAGNOSIS — M79642 Pain in left hand: Secondary | ICD-10-CM | POA: Insufficient documentation

## 2022-03-22 DIAGNOSIS — R600 Localized edema: Secondary | ICD-10-CM | POA: Insufficient documentation

## 2022-03-22 DIAGNOSIS — M6281 Muscle weakness (generalized): Secondary | ICD-10-CM | POA: Diagnosis present

## 2022-03-22 DIAGNOSIS — M25532 Pain in left wrist: Secondary | ICD-10-CM | POA: Diagnosis present

## 2022-03-22 NOTE — Therapy (Signed)
[facility] [address], [place], [address] Phone: [phone]   Fax:  [phone]  Occupational Therapy Treatment  Patient Details  Name: Zachary Sweeney MRN: [id number] Date of Birth: 1945/04/19 Referring Provider (OT): DR Poggi   Encounter Date: 03/22/2022   OT End of Session - 03/22/22 1440     Visit Number 14    Number of Visits 20    Date for OT Re-Evaluation 04/03/22    OT Start Time 1446    OT Stop Time 1535    OT Time Calculation (min) 49 min    Activity Tolerance Patient tolerated treatment well    Behavior During Therapy [facility] for tasks assessed/performed             Past Medical History:  Diagnosis Date   Acid reflux    Arthritis    Back injury    Colon polyp    DVT (deep venous thrombosis) ([place])    a. 06/2017 LE U/S: Right popliteal vein DVT.   Dyspnea on exertion    a. 06/2017 CTA Chest: No PE. No significant coronary Ca2+, centrilobular and paraseptal emphysema;  b.  07/2017 Echo: EF 60-65%, no rwma, Gr1 DD, mildly dil LA. Nl RV fxn; c. 07/2017 MV: Small, severe defect involving apical inf and apical segments - only on rest images consistent w/ artifact. No ischemia or scar.   Emphysema of lung ([place])    a. 06/2017 CTA Chest: Centrilobular and paraseptal emphysema, with mild geographic ground-glass opacity likely representing small airway dzs.   Erythrocytosis 04/17/2019   Hypertension    Sleep apnea    Torn ligament     Past Surgical History:  Procedure Laterality Date   COLONOSCOPY  2008   COLONOSCOPY WITH PROPOFOL N/A 02/27/2017   Procedure: COLONOSCOPY WITH PROPOFOL;  Surgeon: Robert Bellow, MD;  Location: [facility];  Service: Endoscopy;  Laterality: N/A;   COLONOSCOPY WITH PROPOFOL N/A 07/24/2018   Procedure: COLONOSCOPY WITH PROPOFOL;  Surgeon: Virgel Manifold, MD;  Location: [facility];  Service: Endoscopy;  Laterality: N/A;   ESOPHAGOGASTRODUODENOSCOPY (EGD) WITH  PROPOFOL N/A 02/27/2017   Procedure: ESOPHAGOGASTRODUODENOSCOPY (EGD) WITH PROPOFOL;  Surgeon: Robert Bellow, MD;  Location: [facility];  Service: Endoscopy;  Laterality: N/A;   ESOPHAGOGASTRODUODENOSCOPY (EGD) WITH PROPOFOL N/A 07/24/2018   Procedure: ESOPHAGOGASTRODUODENOSCOPY (EGD) WITH PROPOFOL;  Surgeon: Virgel Manifold, MD;  Location: [facility];  Service: Endoscopy;  Laterality: N/A;   HERNIA REPAIR Right    inguinal   INSERTION OF MESH  02/01/2021   Procedure: INSERTION OF MESH;  Surgeon: Herbert Pun, MD;  Location: [facility];  Service: General;;   KNEE SURGERY Left    NECK SURGERY     ROTATOR CUFF REPAIR Right    SHOULDER ARTHROSCOPY WITH SUBACROMIAL DECOMPRESSION, ROTATOR CUFF REPAIR AND BICEP TENDON REPAIR Left 07/25/2021   Procedure: LEFT SHOULDER ARTHROSCOPY WITH DEBRIDEMENT, DECOMPRESSION, DISTAL CLAVICLE EXCISION, BICEPS TENODESIS;  Surgeon: Corky Mull, MD;  Location: [facility];  Service: Orthopedics;  Laterality: Left;   UMBILICAL HERNIA REPAIR N/A 04/22/2019   Procedure: HERNIA REPAIR UMBILICAL ADULT;  Surgeon: Ronny Bacon, MD;  Location: [facility];  Service: General;  Laterality: N/A;    There were no vitals filed for this visit.   Subjective Assessment - 03/22/22 1439     Subjective  I don't know what I have done last night but my hands hurts today- on hold for PT until my hand better - ring finger  still sore and pinching with thumb    Pertinent History Zachary Sweeney is a 77 y.o. male who presents for follow-up with Dr Roland Rack 10/06/21 10 weeks status post a limited arthroscopic debridement, arthroscopic subacromial decompression, arthroscopic distal clavicle excision, mini open rotator cuff repair, and mini-open biceps tenodesis of his left shoulder done 07/25/21. Overall, the patient feels that his shoulder is doing reasonably well. He still notes some soreness in the shoulder for which he will apply ice and take an occasional oxycodone for  discomfort, especially associated with physical therapy. He continues to attend physical therapy, as well as to perform exercises on his own at home and feels that his range of motion and strength continue to improve. He has resumed many of his normal daily activities without difficulty. He does have difficulty sleeping at night, but does not feel that it is due to his shoulder. He denies any recent reinjury to the shoulder, and denies any fevers or chills.    The patient's primary concern today is continued left hand pain and stiffness. He notes that the symptoms have been present since his surgery. They were worse at his last visit, but he notes that the therapist has been working with him and his motion has improved somewhat. However, he still is not yet able to make a fist and has pain with maximal active flexion attempts as well as with passive flexion. He denies any injury to the hand, and denies any numbness or paresthesias to his fingers. refer to OT but then had fx of thumb early Sept - immobilize -refer back to OT    Patient Stated Goals Want my hand and thumb better the swelling, pain and motion /strength so I can take care of myself - toiletting , gripping things and play the bass    Currently in Pain? Yes    Pain Score --   wrist and when making fist               [facility] OT Assessment - 03/22/22 0001       Strength   Left Hand Grip (lbs) 40    Left Hand Lateral Pinch 12 lbs    Left Hand 3 Point Pinch 8 lbs                 Since initiating the rehab for thumb.  Patient fluctuate and progress greatly with thumb pain, wrist pain and fourth digit. With favoring after thumb injury patient overused ulnar side for composite grip causing probably a trigger finger. Patient tender over fourth A1 pulley with triggering. Patient continues to have some dorsal wrist pain but able to decrease that with wearing of neoprene splint and able to tolerate strengthening. 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 PT was put on hold because of aggravating symptoms of the hand with rehab and strengthening exercises. Patient arrived this date with reports of increased symptoms after sleeping. Patient was fitted with a CMC neoprene splint and reviewed with patient some of his PT exercises that he done in the past. Was able to modify pulleys as well as UB E for upper extremity for patient to be able to do pain-free modifying his grip. Also reviewed with him Thera-Band tying a knot pulling through third and fourth webspace with a loose grip and able to do scapular retraction and shoulder extension pain-free. Patient was able to tolerate 1 pound weight with CMC neoprene on for  wrist and forearm 12 reps in all planes pain-free. Grip increased to 40 pounds with CMC neoprene on. 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 Patient got appointment Dr. Candelaria Stagers on Monday for possible shot for fourth digit trigger finger to hopefully help with pain for patient to progress in functional use of left hand as well as strengthening.            OT Education - 03/22/22 1439     Education Details changes HEP/ modify PT exercises -and CMC splint wearing    Person(s) Educated Patient    Methods Explanation;Tactile cues;Verbal cues;Handout;Demonstration    Comprehension Verbalized understanding;Returned demonstration;Verbal cues required;Tactile cues required              OT Short Term Goals - 02/20/22 1724       OT SHORT TERM GOAL #1   Title Patient to be independent in home program to decrease L hand and wrist  edema and pain to have increase left wrist and digit range of motion    Baseline Pt return this date after having thumb close non displace fx early Sept - need review again with HEP- show increase edema, pain and decrease ROM again NOW pt had increase pain in thumb IP few wks again  and now show tenderness over A1pulley of 4th and stiff PIP - no triggering    Time 4    Period Weeks    Status On-going    Target Date 03/20/22               OT Long Term Goals - 02/20/22 1725       OT LONG TERM GOAL #1   Title Left digits active range of motion increased for patient to be able to touch palm to use hand and eating, bathing and dressing with more ease    Baseline decrease Flexion in all digits and thumb -shaking of digits and thumb when attempt to do ROM or use - pain increase in thumb 8/10  - NOW pain 6/10  tight fist can touch palm -using it more  - edema over MC's and tenderness over A1pulley of 4th - stiffness in 4th PIP - last 2-3 wks    Time 6    Period Weeks    Status On-going    Target Date 04/03/22      OT LONG TERM GOAL #2   Title Left wrist active range of motion improved for patient to be able to turn doorknob, push and pull heavy door open without pain    Baseline L wrist AROM cont to be decrease with pain in flexion, ext mostly 8/10 - not using hand / wrist  - NOW wrist improve greatly to 70 flexion, ext - using ir more but stil pain end range flexion of wrist - dorsal wrist    Time 6    Period Weeks    Status On-going    Target Date 04/03/22      OT LONG TERM GOAL #3   Title L thumb AROM increase to [facility] to do buttons, and be able to hygiene and play guitar without increase symptoms    Baseline decrease thumb AROM in all planes with pain increase 8/10 since non displaced close fx  at thumb L - no using thumb  NOW start to use it but had increase pain at IP 3 wks ago -improving this date can do light 2 point pinch to 2nd thru 4th  digit - pain wiht prehension    Time 6    Period Weeks    Status On-going    Target Date 04/03/22      OT LONG TERM GOAL #4   Title Left grip and prehension strength increased to more than 50% compared to the right to return back to prior level of function and ADLs and IADLs    Baseline NT pt return to OT now after non  displace close fx of L thumb - pain AROM 8/10 and edema increase NOW pain - just stared carry empty plate and pinch softly to 2nd and 3rd    Time 6    Period Weeks    Status On-going    Target Date 04/03/22                   Plan - 03/22/22 1440     Clinical Impression Statement Pt refer to OT s/p limited arthroscopic debridement, arthroscopic subacromial decompression, arthroscopic distal clavicle excision, mini open rotator cuff repair, and mini-open biceps tenodesis of his left shoulder on 07/25/21. Pt is about 7 months s/p.  Pt was seen by this OT prior to early Sept to decrease edema/pain- increase motion - did great progress but then fell and fx his thumb non displace fx in Sept- Pt now out more than  4 months from fx. Pt seen after refer to OT for thumb motion and strength. Pt progressed great  but then had few setback with increase thumb IP pain ( OA)  and then also wrist pain -showed icnrease  tenderness over A 1pulley of 4th with triggering. He compensating after thumb injury with ulnar side of hand with gripping causing trigger finger. Fitted pt with CMC neoprene splint and grip increased to 40lbs pain free this date - review with pt modifications with gripping when returning to PT using pulley's, arm bike and bands pain free. Talked with Mikle Bosworth PA about pt's trigger finger - pt got appt for Monday with DR Candelaria Stagers for 4th digit trigger finger .  Limited in progress because of 4th digit pain, thumb IP pain and wrist.  Pt can benefit from skilled OT services to decrease pain with increase ROM and strength in L hand and wrist to return to prior level of function prior to shoulder surgery- pt lives alone - has aid 3 x wk for 1-2 hrs per pt.    OT Occupational Profile and History Problem Focused Assessment - Including review of records relating to presenting problem    Occupational performance deficits (Please refer to evaluation for details): ADL's;IADL's;Play;Social Participation;Leisure     Body Structure / Function / Physical Skills ADL;Coordination;Flexibility;FMC;IADL;ROM;UE functional use;Edema;Dexterity;Pain;Strength    Rehab Potential Good    Clinical Decision Making Limited treatment options, no task modification necessary    Comorbidities Affecting Occupational Performance: None    Modification or Assistance to Complete Evaluation  No modification of tasks or assist necessary to complete eval    OT Frequency 1x / week    OT Duration 6 weeks    OT Treatment/Interventions Self-care/ADL training;Paraffin;Moist Heat;Fluidtherapy;Contrast Bath;Therapeutic exercise;Manual Therapy;Patient/family education;Passive range of motion;DME and/or AE instruction;Splinting    Consulted and Agree with Plan of Care Patient             Patient will benefit from skilled therapeutic intervention in order to improve the following deficits and impairments:   Body Structure / Function / Physical Skills: ADL, Coordination, Flexibility, FMC, IADL, ROM, UE functional use, Edema, Dexterity, Pain, Strength  Visit Diagnosis: Pain in left hand  Pain in left wrist  Stiffness of left hand, not elsewhere classified  Stiffness of left wrist, not elsewhere classified  Localized edema  Muscle weakness (generalized)    Problem List Patient Active Problem List   Diagnosis Date Noted   History of DVT of lower extremity 12/14/2021   Ileus ([place]) 04/25/2021   Acid reflux    Emphysema of lung ([facility])    S/P umbilical hernia repair, follow-up exam 04/30/2019   Erythrocytosis 04/17/2019   Positive fecal occult blood test    Polyp of colon    Diverticulosis of large intestine without diverticulitis    CLE (columnar lined esophagus)    DOE (dyspnea on exertion) 07/19/2017   Atypical chest pain 07/19/2017   Deep vein thrombosis (DVT) of popliteal vein of right lower extremity ([place]) 07/19/2017   Benign prostatic hyperplasia 06/25/2017   Essential hypertension 06/25/2017   Esophageal  dysphagia 01/31/2017   Epigastric pain 01/31/2017   Obesity, unspecified 06/26/2013    Rosalyn Gess, OTR/L,CLT 03/22/2022, 3:57 PM  [facility] [address], [place], [address] Phone: [phone]   Fax:  [phone]  Name: Zachary Sweeney MRN: [id number] Date of Birth: 1946-02-11

## 2022-03-29 ENCOUNTER — Ambulatory Visit: Payer: 59 | Admitting: Occupational Therapy

## 2022-03-29 DIAGNOSIS — M25632 Stiffness of left wrist, not elsewhere classified: Secondary | ICD-10-CM

## 2022-03-29 DIAGNOSIS — M25642 Stiffness of left hand, not elsewhere classified: Secondary | ICD-10-CM

## 2022-03-29 DIAGNOSIS — M79642 Pain in left hand: Secondary | ICD-10-CM | POA: Diagnosis not present

## 2022-03-29 DIAGNOSIS — M25532 Pain in left wrist: Secondary | ICD-10-CM

## 2022-03-29 DIAGNOSIS — M6281 Muscle weakness (generalized): Secondary | ICD-10-CM

## 2022-03-29 NOTE — Therapy (Signed)
[facility] [address], [place], [address] Phone: [phone]   Fax:  [phone]  Occupational Therapy Treatment  Patient Details  Name: Zachary Sweeney MRN: [id number] Date of Birth: 11/03/1945 Referring Provider (OT): DR Poggi   Encounter Date: 03/29/2022   OT End of Session - 03/29/22 1749     Visit Number 15    Number of Visits 20    Date for OT Re-Evaluation 04/03/22    OT Start Time 1530    OT Stop Time 1615    OT Time Calculation (min) 45 min    Activity Tolerance Patient tolerated treatment well    Behavior During Therapy [facility] for tasks assessed/performed             Past Medical History:  Diagnosis Date   Acid reflux    Arthritis    Back injury    Colon polyp    DVT (deep venous thrombosis) ([place])    a. 06/2017 LE U/S: Right popliteal vein DVT.   Dyspnea on exertion    a. 06/2017 CTA Chest: No PE. No significant coronary Ca2+, centrilobular and paraseptal emphysema;  b.  07/2017 Echo: EF 60-65%, no rwma, Gr1 DD, mildly dil LA. Nl RV fxn; c. 07/2017 MV: Small, severe defect involving apical inf and apical segments - only on rest images consistent w/ artifact. No ischemia or scar.   Emphysema of lung ([place])    a. 06/2017 CTA Chest: Centrilobular and paraseptal emphysema, with mild geographic ground-glass opacity likely representing small airway dzs.   Erythrocytosis 04/17/2019   Hypertension    Sleep apnea    Torn ligament     Past Surgical History:  Procedure Laterality Date   COLONOSCOPY  2008   COLONOSCOPY WITH PROPOFOL N/A 02/27/2017   Procedure: COLONOSCOPY WITH PROPOFOL;  Surgeon: Robert Bellow, MD;  Location: [facility] ENDOSCOPY;  Service: Endoscopy;  Laterality: N/A;   COLONOSCOPY WITH PROPOFOL N/A 07/24/2018   Procedure: COLONOSCOPY WITH PROPOFOL;  Surgeon: Virgel Manifold, MD;  Location: [facility] ENDOSCOPY;  Service: Endoscopy;  Laterality: N/A;   ESOPHAGOGASTRODUODENOSCOPY (EGD) WITH  PROPOFOL N/A 02/27/2017   Procedure: ESOPHAGOGASTRODUODENOSCOPY (EGD) WITH PROPOFOL;  Surgeon: Robert Bellow, MD;  Location: [facility] ENDOSCOPY;  Service: Endoscopy;  Laterality: N/A;   ESOPHAGOGASTRODUODENOSCOPY (EGD) WITH PROPOFOL N/A 07/24/2018   Procedure: ESOPHAGOGASTRODUODENOSCOPY (EGD) WITH PROPOFOL;  Surgeon: Virgel Manifold, MD;  Location: [facility] ENDOSCOPY;  Service: Endoscopy;  Laterality: N/A;   HERNIA REPAIR Right    inguinal   INSERTION OF MESH  02/01/2021   Procedure: INSERTION OF MESH;  Surgeon: Herbert Pun, MD;  Location: [facility] ORS;  Service: General;;   KNEE SURGERY Left    NECK SURGERY     ROTATOR CUFF REPAIR Right    SHOULDER ARTHROSCOPY WITH SUBACROMIAL DECOMPRESSION, ROTATOR CUFF REPAIR AND BICEP TENDON REPAIR Left 07/25/2021   Procedure: LEFT SHOULDER ARTHROSCOPY WITH DEBRIDEMENT, DECOMPRESSION, DISTAL CLAVICLE EXCISION, BICEPS TENODESIS;  Surgeon: Corky Mull, MD;  Location: [facility] ORS;  Service: Orthopedics;  Laterality: Left;   UMBILICAL HERNIA REPAIR N/A 04/22/2019   Procedure: HERNIA REPAIR UMBILICAL ADULT;  Surgeon: Ronny Bacon, MD;  Location: [facility] ORS;  Service: General;  Laterality: N/A;    There were no vitals filed for this visit.   Subjective Assessment - 03/29/22 1745     Subjective  I seen the doctor Monday and he gave me a shot my ring finger.  It is much better it helped.  Overall my hand  is doing okay.  But since last night my wrist has been bothering me.    Pertinent History Zachary Sweeney is a 77 y.o. male who presents for follow-up with Dr Roland Rack 10/06/21 10 weeks status post a limited arthroscopic debridement, arthroscopic subacromial decompression, arthroscopic distal clavicle excision, mini open rotator cuff repair, and mini-open biceps tenodesis of his left shoulder done 07/25/21. Overall, the patient feels that his shoulder is doing reasonably well. He still notes some soreness in the shoulder for which he will apply ice and take an  occasional oxycodone for discomfort, especially associated with physical therapy. He continues to attend physical therapy, as well as to perform exercises on his own at home and feels that his range of motion and strength continue to improve. He has resumed many of his normal daily activities without difficulty. He does have difficulty sleeping at night, but does not feel that it is due to his shoulder. He denies any recent reinjury to the shoulder, and denies any fevers or chills.    The patient's primary concern today is continued left hand pain and stiffness. He notes that the symptoms have been present since his surgery. They were worse at his last visit, but he notes that the therapist has been working with him and his motion has improved somewhat. However, he still is not yet able to make a fist and has pain with maximal active flexion attempts as well as with passive flexion. He denies any injury to the hand, and denies any numbness or paresthesias to his fingers. refer to OT but then had fx of thumb early Sept - immobilize -refer back to OT    Patient Stated Goals Want my hand and thumb better the swelling, pain and motion /strength so I can take care of myself - toiletting , gripping things and play the bass    Currently in Pain? Yes    Pain Score 2     Pain Location --   dorsal wrist flexion, ext   Pain Orientation Left    Pain Descriptors / Indicators Tightness;Tender;Aching    Pain Type Chronic pain;Acute pain                Patient report less pain since having a cortisone shot from Dr. Candelaria Stagers this past Monday. Patient with less tenderness as well as gentle composite passive range of motion within normal limits and pain-free. Reinforced with patient to only do that this weekend.  Hold off on contrast as well as doing any grip strengthening or gripping small objects. To enlarge grips if needed. Patient is opposition much better this date with less tremoring. Did simulate patient  holding a bass guitar playing likely cords not aggravating digit flexion. Patient to initiate that task at home pain-free. Patient to wear a CMC neoprene to support thumb and wrist.   Patient complains of dorsal ulnar and radial wrist pain with flexion extension as well as radial ulnar deviation       OT Treatments/Exercises (OP) - 03/29/22 0001       Moist Heat Therapy   Number Minutes Moist Heat 5 Minutes    Moist Heat Location Wrist   L wrist prior ot soft tissue and ROM           After moist heat done joint mobilization with carpal rolls as well as traction and depression.  Also done Grasston tool #2 soft sweeping over dorsal radial ulnar wrist prior to gentle active assisted range of motion for wrist  flexion extension as well as radial ulnar deviation. Great success pain-free active motion. Patient to simulate that at home holding off on any weights for wrist but pain-free motion.        OT Education - 03/29/22 1749     Education Details decrease pain and increase motion - review changes in HEP    Person(s) Educated Patient    Methods Explanation;Tactile cues;Verbal cues;Handout;Demonstration    Comprehension Verbalized understanding;Returned demonstration;Verbal cues required;Tactile cues required              OT Short Term Goals - 02/20/22 1724       OT SHORT TERM GOAL #1   Title Patient to be independent in home program to decrease L hand and wrist  edema and pain to have increase left wrist and digit range of motion    Baseline Pt return this date after having thumb close non displace fx early Sept - need review again with HEP- show increase edema, pain and decrease ROM again NOW pt had increase pain in thumb IP few wks again and now show tenderness over A1pulley of 4th and stiff PIP - no triggering    Time 4    Period Weeks    Status On-going    Target Date 03/20/22               OT Long Term Goals - 02/20/22 1725       OT LONG TERM GOAL #1    Title Left digits active range of motion increased for patient to be able to touch palm to use hand and eating, bathing and dressing with more ease    Baseline decrease Flexion in all digits and thumb -shaking of digits and thumb when attempt to do ROM or use - pain increase in thumb 8/10  - NOW pain 6/10  tight fist can touch palm -using it more  - edema over MC's and tenderness over A1pulley of 4th - stiffness in 4th PIP - last 2-3 wks    Time 6    Period Weeks    Status On-going    Target Date 04/03/22      OT LONG TERM GOAL #2   Title Left wrist active range of motion improved for patient to be able to turn doorknob, push and pull heavy door open without pain    Baseline L wrist AROM cont to be decrease with pain in flexion, ext mostly 8/10 - not using hand / wrist  - NOW wrist improve greatly to 70 flexion, ext - using ir more but stil pain end range flexion of wrist - dorsal wrist    Time 6    Period Weeks    Status On-going    Target Date 04/03/22      OT LONG TERM GOAL #3   Title L thumb AROM increase to [facility] to do buttons, and be able to hygiene and play guitar without increase symptoms    Baseline decrease thumb AROM in all planes with pain increase 8/10 since non displaced close fx  at thumb L - no using thumb  NOW start to use it but had increase pain at IP 3 wks ago -improving this date can do light 2 point pinch to 2nd thru 4th digit - pain wiht prehension    Time 6    Period Weeks    Status On-going    Target Date 04/03/22      OT LONG TERM GOAL #4   Title Left grip  and prehension strength increased to more than 50% compared to the right to return back to prior level of function and ADLs and IADLs    Baseline NT pt return to OT now after non displace close fx of L thumb - pain AROM 8/10 and edema increase NOW pain - just stared carry empty plate and pinch softly to 2nd and 3rd    Time 6    Period Weeks    Status On-going    Target Date 04/03/22                    Plan - 03/29/22 1750     Clinical Impression Statement Pt refer to OT s/p limited arthroscopic debridement, arthroscopic subacromial decompression, arthroscopic distal clavicle excision, mini open rotator cuff repair, and mini-open biceps tenodesis of his left shoulder on 07/25/21. Pt is about 7 months s/p.  Pt was seen by this OT prior to early Sept to decrease edema/pain- increase motion - did great progress but then fell and fx his thumb non displace fx in Sept- Pt now out more than  4 months from fx. Pt seen after refer to OT for thumb motion and strength. Pt progressed great  but then had few setback with increase thumb IP pain ( OA)  and then also wrist pain -showed icnrease  tenderness over A 1pulley of 4th with triggering. He compensating after thumb injury with ulnar side of hand with gripping causing trigger finger. Fitted pt with CMC neoprene splint and grip increased to 40lbs pain free in the past  - review with pt modifications with gripping when returning to PT using pulley's, arm bike and bands pain free. Pt return this date after having cortizone shot in 4th digit earlier this week- with less pain and edema. Pt to focus on gentle PROM for composite fist this week, play cords only with digits on Bass guitar and pain free AROM after some soft tissue mobs.   Pt can benefit from skilled OT services to decrease pain with increase ROM and strength in L hand and wrist to return to prior level of function prior to shoulder surgery- pt lives alone - has aid 3 x wk for 1-2 hrs per pt.    OT Occupational Profile and History Problem Focused Assessment - Including review of records relating to presenting problem    Occupational performance deficits (Please refer to evaluation for details): ADL's;IADL's;Play;Social Participation;Leisure    Body Structure / Function / Physical Skills ADL;Coordination;Flexibility;FMC;IADL;ROM;UE functional use;Edema;Dexterity;Pain;Strength    Rehab Potential  Good    Clinical Decision Making Limited treatment options, no task modification necessary    Comorbidities Affecting Occupational Performance: None    Modification or Assistance to Complete Evaluation  No modification of tasks or assist necessary to complete eval    OT Frequency 1x / week    OT Duration 6 weeks    OT Treatment/Interventions Self-care/ADL training;Paraffin;Moist Heat;Fluidtherapy;Contrast Bath;Therapeutic exercise;Manual Therapy;Patient/family education;Passive range of motion;DME and/or AE instruction;Splinting    Consulted and Agree with Plan of Care Patient             Patient will benefit from skilled therapeutic intervention in order to improve the following deficits and impairments:   Body Structure / Function / Physical Skills: ADL, Coordination, Flexibility, FMC, IADL, ROM, UE functional use, Edema, Dexterity, Pain, Strength       Visit Diagnosis: Pain in left hand  Pain in left wrist  Stiffness of left hand, not elsewhere classified  Stiffness of left  wrist, not elsewhere classified  Muscle weakness (generalized)    Problem List Patient Active Problem List   Diagnosis Date Noted   History of DVT of lower extremity 12/14/2021   Ileus ([place]) 04/25/2021   Acid reflux    Emphysema of lung ([facility])    S/P umbilical hernia repair, follow-up exam 04/30/2019   Erythrocytosis 04/17/2019   Positive fecal occult blood test    Polyp of colon    Diverticulosis of large intestine without diverticulitis    CLE (columnar lined esophagus)    DOE (dyspnea on exertion) 07/19/2017   Atypical chest pain 07/19/2017   Deep vein thrombosis (DVT) of popliteal vein of right lower extremity ([place]) 07/19/2017   Benign prostatic hyperplasia 06/25/2017   Essential hypertension 06/25/2017   Esophageal dysphagia 01/31/2017   Epigastric pain 01/31/2017   Obesity, unspecified 06/26/2013    Rosalyn Gess, OTR/L,CLT 03/29/2022, 5:54 PM  [facility] [address] [place], [address] Phone: [phone]   Fax:  [phone]  Name: TIERNAN SUTO MRN: [id number] Date of Birth: Jan 17, 1946

## 2022-04-05 ENCOUNTER — Ambulatory Visit: Payer: 59 | Admitting: Occupational Therapy

## 2022-04-05 DIAGNOSIS — R600 Localized edema: Secondary | ICD-10-CM

## 2022-04-05 DIAGNOSIS — M79642 Pain in left hand: Secondary | ICD-10-CM | POA: Diagnosis not present

## 2022-04-05 DIAGNOSIS — M25642 Stiffness of left hand, not elsewhere classified: Secondary | ICD-10-CM

## 2022-04-05 DIAGNOSIS — M25632 Stiffness of left wrist, not elsewhere classified: Secondary | ICD-10-CM

## 2022-04-05 DIAGNOSIS — M6281 Muscle weakness (generalized): Secondary | ICD-10-CM

## 2022-04-05 DIAGNOSIS — M25532 Pain in left wrist: Secondary | ICD-10-CM

## 2022-04-05 NOTE — Therapy (Signed)
[facility] [address] [place], [address] Phone: [phone]   Fax:  [phone]  Occupational Therapy Treatment  Patient Details  Name: Zachary Sweeney MRN: [id number] Date of Birth: 03-08-1945 Referring Provider (OT): DR Poggi   Encounter Date: 04/05/2022   OT End of Session - 04/05/22 1903     Visit Number 16    Number of Visits 20    Date for OT Re-Evaluation 05/03/22    OT Start Time 1402    OT Stop Time 1445    OT Time Calculation (min) 43 min    Activity Tolerance Patient tolerated treatment well    Behavior During Therapy [facility] for tasks assessed/performed             Past Medical History:  Diagnosis Date   Acid reflux    Arthritis    Back injury    Colon polyp    DVT (deep venous thrombosis) ([place])    a. 06/2017 LE U/S: Right popliteal vein DVT.   Dyspnea on exertion    a. 06/2017 CTA Chest: No PE. No significant coronary Ca2+, centrilobular and paraseptal emphysema;  b.  07/2017 Echo: EF 60-65%, no rwma, Gr1 DD, mildly dil LA. Nl RV fxn; c. 07/2017 MV: Small, severe defect involving apical inf and apical segments - only on rest images consistent w/ artifact. No ischemia or scar.   Emphysema of lung ([place])    a. 06/2017 CTA Chest: Centrilobular and paraseptal emphysema, with mild geographic ground-glass opacity likely representing small airway dzs.   Erythrocytosis 04/17/2019   Hypertension    Sleep apnea    Torn ligament     Past Surgical History:  Procedure Laterality Date   COLONOSCOPY  2008   COLONOSCOPY WITH PROPOFOL N/A 02/27/2017   Procedure: COLONOSCOPY WITH PROPOFOL;  Surgeon: Robert Bellow, MD;  Location: [facility] ENDOSCOPY;  Service: Endoscopy;  Laterality: N/A;   COLONOSCOPY WITH PROPOFOL N/A 07/24/2018   Procedure: COLONOSCOPY WITH PROPOFOL;  Surgeon: Virgel Manifold, MD;  Location: [facility] ENDOSCOPY;  Service: Endoscopy;  Laterality: N/A;   ESOPHAGOGASTRODUODENOSCOPY (EGD) WITH  PROPOFOL N/A 02/27/2017   Procedure: ESOPHAGOGASTRODUODENOSCOPY (EGD) WITH PROPOFOL;  Surgeon: Robert Bellow, MD;  Location: [facility] ENDOSCOPY;  Service: Endoscopy;  Laterality: N/A;   ESOPHAGOGASTRODUODENOSCOPY (EGD) WITH PROPOFOL N/A 07/24/2018   Procedure: ESOPHAGOGASTRODUODENOSCOPY (EGD) WITH PROPOFOL;  Surgeon: Virgel Manifold, MD;  Location: [facility] ENDOSCOPY;  Service: Endoscopy;  Laterality: N/A;   HERNIA REPAIR Right    inguinal   INSERTION OF MESH  02/01/2021   Procedure: INSERTION OF MESH;  Surgeon: Herbert Pun, MD;  Location: [facility] ORS;  Service: General;;   KNEE SURGERY Left    NECK SURGERY     ROTATOR CUFF REPAIR Right    SHOULDER ARTHROSCOPY WITH SUBACROMIAL DECOMPRESSION, ROTATOR CUFF REPAIR AND BICEP TENDON REPAIR Left 07/25/2021   Procedure: LEFT SHOULDER ARTHROSCOPY WITH DEBRIDEMENT, DECOMPRESSION, DISTAL CLAVICLE EXCISION, BICEPS TENODESIS;  Surgeon: Corky Mull, MD;  Location: [facility] ORS;  Service: Orthopedics;  Laterality: Left;   UMBILICAL HERNIA REPAIR N/A 04/22/2019   Procedure: HERNIA REPAIR UMBILICAL ADULT;  Surgeon: Ronny Bacon, MD;  Location: [facility] ORS;  Service: General;  Laterality: N/A;    There were no vitals filed for this visit.   Subjective Assessment - 04/05/22 1901     Subjective  I can use my hand better at home like holding water bottle, I could even open jar or bottle - had some pain but not  bad. Pull up pants depending on what type of pants -and I can do toilet hygiene now.    Pertinent History Zachary Sweeney is a 77 y.o. male who presents for follow-up with Dr Roland Rack 10/06/21 10 weeks status post a limited arthroscopic debridement, arthroscopic subacromial decompression, arthroscopic distal clavicle excision, mini open rotator cuff repair, and mini-open biceps tenodesis of his left shoulder done 07/25/21. Overall, the patient feels that his shoulder is doing reasonably well. He still notes some soreness in the shoulder for which he will apply  ice and take an occasional oxycodone for discomfort, especially associated with physical therapy. He continues to attend physical therapy, as well as to perform exercises on his own at home and feels that his range of motion and strength continue to improve. He has resumed many of his normal daily activities without difficulty. He does have difficulty sleeping at night, but does not feel that it is due to his shoulder. He denies any recent reinjury to the shoulder, and denies any fevers or chills.    The patient's primary concern today is continued left hand pain and stiffness. He notes that the symptoms have been present since his surgery. They were worse at his last visit, but he notes that the therapist has been working with him and his motion has improved somewhat. However, he still is not yet able to make a fist and has pain with maximal active flexion attempts as well as with passive flexion. He denies any injury to the hand, and denies any numbness or paresthesias to his fingers. refer to OT but then had fx of thumb early Sept - immobilize -refer back to OT    Patient Stated Goals Want my hand and thumb better the swelling, pain and motion /strength so I can take care of myself - toiletting , gripping things and play the bass    Currently in Pain? Yes    Pain Score 2     Pain Location --   dorsal wrist and A 1pulley of 4th   Pain Orientation Left    Pain Descriptors / Indicators Aching;Tender    Pain Type Acute pain;Chronic pain                          OT Treatments/Exercises (OP) - 04/05/22 0001       Moist Heat Therapy   Number Minutes Moist Heat 5 Minutes    Moist Heat Location Wrist   prior to soft tissue and ROM           Patient report less pain since having a cortisone shot from Dr. Candelaria Stagers last week -still some tenderness Patient with less tenderness as well as gentle composite passive range of motion within normal limits and pain-free. Reinforced with  patient to only do pain free AAROM and PROM  Cont to  enlarge grips if needed. Patient is opposition much better this date with less tremoring. Did simulate patient holding a bass guitar playing likely cords not aggravating digit flexion. Patient to initiate that task at home pain-free. Did not do it yet. Patient to wear a CMC neoprene to support thumb and wrist.     Patient complains of dorsal ulnar and radial wrist pain with flexion extension as well as radial ulnar deviation Done WB with carpal rolls and graston tool nr 2 for sweeping over wrist and forearm - dorsal and palmar AAROM for wrist in all planes  Done [place] 3 x pain  free in all motion for wrist  1lbs for UD and RD less pain  1-2/10 pain for wrist flexion, ext  Pt to cont with functional strength only         OT Education - 04/05/22 1903     Education Details decrease pain and increase motion - review changes in HEP    Person(s) Educated Patient    Methods Explanation;Tactile cues;Verbal cues;Handout;Demonstration    Comprehension Verbalized understanding;Returned demonstration;Verbal cues required;Tactile cues required              OT Short Term Goals - 04/05/22 1908       OT SHORT TERM GOAL #1   Title Patient to be independent in home program to decrease L hand and wrist  edema and pain to have increase left wrist and digit range of motion    Baseline Pt return this date after having thumb close non displace fx early Sept - need review again with HEP- show increase edema, pain and decrease ROM again NOW  pain in thumb IP decrease and had shot in 4th A1pulley for trigger finger last week- pain dorsal wrist end range flexion, ext and tenderness 4th [place]  but less    Time 4    Period Weeks    Status On-going    Target Date 05/03/22               OT Long Term Goals - 04/05/22 1909       OT LONG TERM GOAL #1   Title Left digits active range of motion increased for patient to be able to touch palm  to use hand and eating, bathing and dressing with more ease    Status Achieved      OT LONG TERM GOAL #2   Title Left wrist active range of motion improved for patient to be able to turn doorknob, push and pull heavy door open without pain    Baseline L wrist AROM cont to be decrease with pain in flexion, ext mostly 8/10 - not using hand / wrist  - NOW wrist AROM increase and strenght - can turn doorknob but push and pull end range flexion ,ext stil pain over dorsal wrist - 2-4/10    Time 4    Period Weeks    Status On-going    Target Date 05/03/22      OT LONG TERM GOAL #3   Title L thumb AROM increase to [facility] to do buttons, and be able to hygiene and play guitar without increase symptoms    Baseline decrease thumb AROM in all planes with pain increase 8/10 since non displaced close fx  at thumb L - no using thumb  NOW can use in hygiene and graspling objects -simulate playing cords on guitar - with less tremors but did not done at home yet    Time 4    Period Weeks    Status On-going    Target Date 05/03/22      OT LONG TERM GOAL #4   Title Left grip and prehension strength increased to more than 50% compared to the right to return back to prior level of function and ADLs and IADLs    Baseline NT pt return to OT now after non displace close fx of L thumb - pain AROM 8/10 and edema increase NOW did not assess since got trigger finger shot in 4th digit last week    Time 4    Period Weeks    Status  On-going    Target Date 05/03/22                   Plan - 04/05/22 1904     Clinical Impression Statement Pt refer to OT s/p limited arthroscopic debridement, arthroscopic subacromial decompression, arthroscopic distal clavicle excision, mini open rotator cuff repair, and mini-open biceps tenodesis of his left shoulder on 07/25/21. Pt is about 8 months s/p.  Pt was seen by this OT prior to early Sept 23 to decrease edema/pain- increase motion - did great progress but then fell and fx  his thumb non displace fx in Sept- Pt now out more than  5 months from fx. Pt seen after refer to OT for thumb motion and strength. Pt progressed great  but then had few setback with increase thumb IP pain ( OA)  and then also wrist pain and  4th digit trigger finger. Fitted pt with CMC neoprene splint and grip increased to 40lbs pain free in the past  - review with pt modifications with gripping when returning to PT using pulley's, arm bike and bands pain free. Pt return last week after having cortizone shot in 4th digit - with less pain and edema. Pt to focus on gentle PROM for composite fist - was able to simulate play cords  with digits on Bass guitar pain free and less tremors and pain free AROM after some soft tissue mobs.  Encourage with pt functional strengthening the next 4 wks to return to using L hand in ADL's and IADL's to return to prior level of function prior to shoulder surgery- pt lives alone - has aid 3 x wk for 1-2 hrs per pt.    OT Occupational Profile and History Problem Focused Assessment - Including review of records relating to presenting problem    Occupational performance deficits (Please refer to evaluation for details): ADL's;IADL's;Play;Social Participation;Leisure    Body Structure / Function / Physical Skills ADL;Coordination;Flexibility;FMC;IADL;ROM;UE functional use;Edema;Dexterity;Pain;Strength    Rehab Potential Good    Clinical Decision Making Limited treatment options, no task modification necessary    Comorbidities Affecting Occupational Performance: None    Modification or Assistance to Complete Evaluation  No modification of tasks or assist necessary to complete eval    OT Frequency 1x / week    OT Duration 4 weeks    OT Treatment/Interventions Self-care/ADL training;Paraffin;Moist Heat;Fluidtherapy;Contrast Bath;Therapeutic exercise;Manual Therapy;Patient/family education;Passive range of motion;DME and/or AE instruction;Splinting    Consulted and Agree with Plan  of Care Patient             Patient will benefit from skilled therapeutic intervention in order to improve the following deficits and impairments:   Body Structure / Function / Physical Skills: ADL, Coordination, Flexibility, FMC, IADL, ROM, UE functional use, Edema, Dexterity, Pain, Strength       Visit Diagnosis: Pain in left hand  Pain in left wrist  Stiffness of left hand, not elsewhere classified  Stiffness of left wrist, not elsewhere classified  Muscle weakness (generalized)  Localized edema    Problem List Patient Active Problem List   Diagnosis Date Noted   History of DVT of lower extremity 12/14/2021   Ileus ([place]) 04/25/2021   Acid reflux    Emphysema of lung ([facility])    S/P umbilical hernia repair, follow-up exam 04/30/2019   Erythrocytosis 04/17/2019   Positive fecal occult blood test    Polyp of colon    Diverticulosis of large intestine without diverticulitis    CLE (columnar lined esophagus)  DOE (dyspnea on exertion) 07/19/2017   Atypical chest pain 07/19/2017   Deep vein thrombosis (DVT) of popliteal vein of right lower extremity ([place]) 07/19/2017   Benign prostatic hyperplasia 06/25/2017   Essential hypertension 06/25/2017   Esophageal dysphagia 01/31/2017   Epigastric pain 01/31/2017   Obesity, unspecified 06/26/2013    Rosalyn Gess, OTR/L,CLT 04/05/2022, 7:12 PM  [facility] [address], [place], [address] Phone: [phone]   Fax:  [phone]  Name: Zachary Sweeney MRN: [id number] Date of Birth: Jul 06, 1945

## 2022-04-10 ENCOUNTER — Ambulatory Visit: Payer: 59 | Admitting: Occupational Therapy

## 2022-04-10 DIAGNOSIS — M25532 Pain in left wrist: Secondary | ICD-10-CM

## 2022-04-10 DIAGNOSIS — R600 Localized edema: Secondary | ICD-10-CM

## 2022-04-10 DIAGNOSIS — M25642 Stiffness of left hand, not elsewhere classified: Secondary | ICD-10-CM

## 2022-04-10 DIAGNOSIS — M25632 Stiffness of left wrist, not elsewhere classified: Secondary | ICD-10-CM

## 2022-04-10 DIAGNOSIS — M79642 Pain in left hand: Secondary | ICD-10-CM | POA: Diagnosis not present

## 2022-04-10 DIAGNOSIS — M6281 Muscle weakness (generalized): Secondary | ICD-10-CM

## 2022-04-10 NOTE — Therapy (Signed)
[facility] [address] [place], [address] Phone: [phone]   Fax:  [phone]  Occupational Therapy Treatment  Patient Details  Name: Zachary Sweeney MRN: [id number] Date of Birth: [birth year] Referring Provider (OT): DR Poggi   Encounter Date: 04/10/2022   OT End of Session - 04/10/22 1635     Visit Number 17    Number of Visits 20    Date for OT Re-Evaluation 05/03/22    OT Start Time 1420    OT Stop Time 1445    OT Time Calculation (min) 25 min    Activity Tolerance Patient tolerated treatment well    Behavior During Therapy [facility] for tasks assessed/performed             Past Medical History:  Diagnosis Date   Acid reflux    Arthritis    Back injury    Colon polyp    DVT (deep venous thrombosis) ([place])    a. 06/2017 LE U/S: Right popliteal vein DVT.   Dyspnea on exertion    a. 06/2017 CTA Chest: No PE. No significant coronary Ca2+, centrilobular and paraseptal emphysema;  b.  07/2017 Echo: EF 60-65%, no rwma, Gr1 DD, mildly dil LA. Nl RV fxn; c. 07/2017 MV: Small, severe defect involving apical inf and apical segments - only on rest images consistent w/ artifact. No ischemia or scar.   Emphysema of lung ([place])    a. 06/2017 CTA Chest: Centrilobular and paraseptal emphysema, with mild geographic ground-glass opacity likely representing small airway dzs.   Erythrocytosis 04/17/2019   Hypertension    Sleep apnea    Torn ligament     Past Surgical History:  Procedure Laterality Date   COLONOSCOPY  2008   COLONOSCOPY WITH PROPOFOL N/A 02/27/2017   Procedure: COLONOSCOPY WITH PROPOFOL;  Surgeon: Robert Bellow, MD;  Location: [facility] ENDOSCOPY;  Service: Endoscopy;  Laterality: N/A;   COLONOSCOPY WITH PROPOFOL N/A 07/24/2018   Procedure: COLONOSCOPY WITH PROPOFOL;  Surgeon: Zachary Manifold, MD;  Location: [facility] ENDOSCOPY;  Service: Endoscopy;  Laterality: N/A;   ESOPHAGOGASTRODUODENOSCOPY (EGD) WITH  PROPOFOL N/A 02/27/2017   Procedure: ESOPHAGOGASTRODUODENOSCOPY (EGD) WITH PROPOFOL;  Surgeon: Robert Bellow, MD;  Location: [facility] ENDOSCOPY;  Service: Endoscopy;  Laterality: N/A;   ESOPHAGOGASTRODUODENOSCOPY (EGD) WITH PROPOFOL N/A 07/24/2018   Procedure: ESOPHAGOGASTRODUODENOSCOPY (EGD) WITH PROPOFOL;  Surgeon: Zachary Manifold, MD;  Location: [facility] ENDOSCOPY;  Service: Endoscopy;  Laterality: N/A;   HERNIA REPAIR Right    inguinal   INSERTION OF MESH  02/01/2021   Procedure: INSERTION OF MESH;  Surgeon: Herbert Pun, MD;  Location: [facility] ORS;  Service: General;;   KNEE SURGERY Left    NECK SURGERY     ROTATOR CUFF REPAIR Right    SHOULDER ARTHROSCOPY WITH SUBACROMIAL DECOMPRESSION, ROTATOR CUFF REPAIR AND BICEP TENDON REPAIR Left 07/25/2021   Procedure: LEFT SHOULDER ARTHROSCOPY WITH DEBRIDEMENT, DECOMPRESSION, DISTAL CLAVICLE EXCISION, BICEPS TENODESIS;  Surgeon: Corky Mull, MD;  Location: [facility] ORS;  Service: Orthopedics;  Laterality: Left;   UMBILICAL HERNIA REPAIR N/A 04/22/2019   Procedure: HERNIA REPAIR UMBILICAL ADULT;  Surgeon: Ronny Bacon, MD;  Location: [facility] ORS;  Service: General;  Laterality: N/A;    There were no vitals filed for this visit.   Subjective Assessment - 04/10/22 1633     Subjective  Doing really good my hand - pain better and using my hand in most every thing except turning doorknob at my front door - but  it is tight and gallon of water is heavy to pick up but that is my shoulder    Pertinent History Zachary Sweeney is a 77 y.o. male who presents for follow-up with Dr Roland Rack 10/06/21 10 weeks status post a limited arthroscopic debridement, arthroscopic subacromial decompression, arthroscopic distal clavicle excision, mini open rotator cuff repair, and mini-open biceps tenodesis of his left shoulder done 07/25/21. Overall, the patient feels that his shoulder is doing reasonably well. He still notes some soreness in the shoulder for which he will apply  ice and take an occasional oxycodone for discomfort, especially associated with physical therapy. He continues to attend physical therapy, as well as to perform exercises on his own at home and feels that his range of motion and strength continue to improve. He has resumed many of his normal daily activities without difficulty. He does have difficulty sleeping at night, but does not feel that it is due to his shoulder. He denies any recent reinjury to the shoulder, and denies any fevers or chills.    The patient's primary concern today is continued left hand pain and stiffness. He notes that the symptoms have been present since his surgery. They were worse at his last visit, but he notes that the therapist has been working with him and his motion has improved somewhat. However, he still is not yet able to make a fist and has pain with maximal active flexion attempts as well as with passive flexion. He denies any injury to the hand, and denies any numbness or paresthesias to his fingers. refer to OT but then had fx of thumb early Sept - immobilize -refer back to OT    Patient Stated Goals Want my hand and thumb better the swelling, pain and motion /strength so I can take care of myself - toiletting , gripping things and play the bass    Currently in Pain? Yes    Pain Score 2     Pain Location Wrist   dorsal wrist - resistance to wrist without neoprene spline   Pain Orientation Left    Pain Descriptors / Indicators Aching                [facility] OT Assessment - 04/10/22 0001       AROM   Left Wrist Extension 70 Degrees    Left Wrist Flexion 75 Degrees    Left Wrist Radial Deviation 20 Degrees    Left Wrist Ulnar Deviation 30 Degrees      Strength   Left Hand Grip (lbs) 40    Left Hand Lateral Pinch 12 lbs    Left Hand 3 Point Pinch 8 lbs             Patient report pain much better in 4th digit and range of motion  Able to grip and make fist pain free Less stiffness Had cortisone shot  from Dr. Candelaria Stagers 2 wks ago Pt to cont gentle composite passive range of motion  flexion within normal limits and pain-free. Reinforced with patient to only do pain free AAROM and PROM  Cont to  enlarge grips if needed. Patient is opposition much better this date with less tremoring. Did simulate patient holding a bass guitar playing likely cords not aggravating digit flexion. Pt able to pinch 2 and 3 point - and lat pinch into 2 lbs clothing pin- pain free Pt to cont with functional strengthening  Pt has no pain when wearing CMC neoprene to support thumb and wrist.  Less pain with resistance to wrist in all planes with CMC neoprene     Discuss with pt when returning to PT - enlarge grip around bands, cuff weights And using pulley's and arm bike - large grip and do not grip tight  Use CMC neoprene splint  Follow up in 3 wks if needed  Pt use hand in all ADL's now except turning doorknob still difficulty - pt to modify grip and use forearm to turn                    OT Education - 04/10/22 1634     Education Details HEP functional strengthening    Person(s) Educated Patient    Methods Explanation;Tactile cues;Verbal cues;Handout;Demonstration    Comprehension Verbalized understanding;Returned demonstration;Verbal cues required;Tactile cues required              OT Short Term Goals - 04/05/22 1908       OT SHORT TERM GOAL #1   Title Patient to be independent in home program to decrease L hand and wrist  edema and pain to have increase left wrist and digit range of motion    Baseline Pt return this date after having thumb close non displace fx early Sept - need review again with HEP- show increase edema, pain and decrease ROM again NOW  pain in thumb IP decrease and had shot in 4th A1pulley for trigger finger last week- pain dorsal wrist end range flexion, ext and tenderness 4th Apulley  but less    Time 4    Period Weeks    Status On-going    Target Date 05/03/22                OT Long Term Goals - 04/05/22 1909       OT LONG TERM GOAL #1   Title Left digits active range of motion increased for patient to be able to touch palm to use hand and eating, bathing and dressing with more ease    Status Achieved      OT LONG TERM GOAL #2   Title Left wrist active range of motion improved for patient to be able to turn doorknob, push and pull heavy door open without pain    Baseline L wrist AROM cont to be decrease with pain in flexion, ext mostly 8/10 - not using hand / wrist  - NOW wrist AROM increase and strenght - can turn doorknob but push and pull end range flexion ,ext stil pain over dorsal wrist - 2-4/10    Time 4    Period Weeks    Status On-going    Target Date 05/03/22      OT LONG TERM GOAL #3   Title L thumb AROM increase to [facility] to do buttons, and be able to hygiene and play guitar without increase symptoms    Baseline decrease thumb AROM in all planes with pain increase 8/10 since non displaced close fx  at thumb L - no using thumb  NOW can use in hygiene and graspling objects -simulate playing cords on guitar - with less tremors but did not done at home yet    Time 4    Period Weeks    Status On-going    Target Date 05/03/22      OT LONG TERM GOAL #4   Title Left grip and prehension strength increased to more than 50% compared to the right to return back to prior level of function and ADLs  and IADLs    Baseline NT pt return to OT now after non displace close fx of L thumb - pain AROM 8/10 and edema increase NOW did not assess since got trigger finger shot in 4th digit last week    Time 4    Period Weeks    Status On-going    Target Date 05/03/22                   Plan - 04/10/22 1635     Clinical Impression Statement Pt refer to OT s/p limited arthroscopic debridement, arthroscopic subacromial decompression, arthroscopic distal clavicle excision, mini open rotator cuff repair, and mini-open biceps tenodesis of his left  shoulder on 07/25/21. Pt is about 8 months s/p.  Pt was seen by this OT prior to early Sept 23 to decrease edema/pain- increase motion - did great progress but then fell and fx his thumb non displace fx in Sept- Pt now out more than  5 months from fx. Pt seen after refer to OT for thumb motion and strength. Pt progressed great  but then had few setback with increase thumb IP pain ( OA)  and then also wrist pain and  4th digit trigger finger. Fitted pt with CMC neoprene splint and grip increased to 40lbs pain free in the past  - review with pt modifications with gripping when returning to PT using pulley's, arm bike and bands pain free- using enlarge grip. Pt cont to have relieve of pain in 4th digit since having cortizone shot in 4th digit .Pt with increase wrist AROM with less pain and thumb pinching with less pain. Pt to cont with functional strenghtening for hand - using CMC neoprene splint - Pt report using hand in most all ADL's - pt to cont with HEP - and can reach out to PT for starting back - but recommend some modifications to decrease arthritis pain at thumb and dorsal wrist .  Encourage with pt functional strengthening the next 3 wks to return to using L hand in ADL's and IADL's to return to prior level of function prior to shoulder surgery- pt lives alone - has aid 3 x wk for 1-2 hrs per pt.    OT Occupational Profile and History Problem Focused Assessment - Including review of records relating to presenting problem    Occupational performance deficits (Please refer to evaluation for details): ADL's;IADL's;Play;Social Participation;Leisure    Body Structure / Function / Physical Skills ADL;Coordination;Flexibility;FMC;IADL;ROM;UE functional use;Edema;Dexterity;Pain;Strength    Rehab Potential Good    Clinical Decision Making Limited treatment options, no task modification necessary    Comorbidities Affecting Occupational Performance: None    Modification or Assistance to Complete Evaluation  No  modification of tasks or assist necessary to complete eval    OT Frequency --   in 3 wks   OT Duration 4 weeks    OT Treatment/Interventions Self-care/ADL training;Paraffin;Moist Heat;Fluidtherapy;Contrast Bath;Therapeutic exercise;Manual Therapy;Patient/family education;Passive range of motion;DME and/or AE instruction;Splinting    Consulted and Agree with Plan of Care Patient             Patient will benefit from skilled therapeutic intervention in order to improve the following deficits and impairments:   Body Structure / Function / Physical Skills: ADL, Coordination, Flexibility, FMC, IADL, ROM, UE functional use, Edema, Dexterity, Pain, Strength       Visit Diagnosis: Pain in left hand  Pain in left wrist  Stiffness of left hand, not elsewhere classified  Stiffness of left wrist, not  elsewhere classified  Muscle weakness (generalized)  Localized edema    Problem List Patient Active Problem List   Diagnosis Date Noted   History of DVT of lower extremity 12/14/2021   Ileus ([place]) 04/25/2021   Acid reflux    Emphysema of lung ([facility])    S/P umbilical hernia repair, follow-up exam 04/30/2019   Erythrocytosis 04/17/2019   Positive fecal occult blood test    Polyp of colon    Diverticulosis of large intestine without diverticulitis    CLE (columnar lined esophagus)    DOE (dyspnea on exertion) 07/19/2017   Atypical chest pain 07/19/2017   Deep vein thrombosis (DVT) of popliteal vein of right lower extremity ([place]) 07/19/2017   Benign prostatic hyperplasia 06/25/2017   Essential hypertension 06/25/2017   Esophageal dysphagia 01/31/2017   Epigastric pain 01/31/2017   Obesity, unspecified 06/26/2013    Rosalyn Gess, OTR/L,CLT 04/10/2022, 4:57 PM  [facility] [address] [place], [address] Phone: [phone]   Fax:  [phone]  Name: Zachary Sweeney MRN: [id number] Date of Birth:  [birth year]

## 2022-04-17 ENCOUNTER — Encounter: Payer: 59 | Admitting: Occupational Therapy

## 2022-05-03 ENCOUNTER — Encounter: Payer: Self-pay | Admitting: Gastroenterology

## 2022-05-04 ENCOUNTER — Encounter: Payer: Self-pay | Admitting: Gastroenterology

## 2022-05-04 ENCOUNTER — Ambulatory Visit
Admission: RE | Admit: 2022-05-04 | Discharge: 2022-05-04 | Disposition: A | Discharge status: Home / Self Care | Payer: 59 | Source: Ambulatory Visit | Attending: Gastroenterology | Admitting: Gastroenterology

## 2022-05-04 ENCOUNTER — Ambulatory Visit: Payer: 59 | Admitting: Anesthesiology

## 2022-05-04 ENCOUNTER — Encounter
Admission: RE | Disposition: A | Discharge status: Home / Self Care | Payer: Self-pay | Source: Ambulatory Visit | Attending: Gastroenterology

## 2022-05-04 DIAGNOSIS — D123 Benign neoplasm of transverse colon: Secondary | ICD-10-CM | POA: Diagnosis not present

## 2022-05-04 DIAGNOSIS — Z86718 Personal history of other venous thrombosis and embolism: Secondary | ICD-10-CM | POA: Insufficient documentation

## 2022-05-04 DIAGNOSIS — Z1211 Encounter for screening for malignant neoplasm of colon: Secondary | ICD-10-CM | POA: Diagnosis present

## 2022-05-04 DIAGNOSIS — K219 Gastro-esophageal reflux disease without esophagitis: Secondary | ICD-10-CM | POA: Diagnosis not present

## 2022-05-04 DIAGNOSIS — Z09 Encounter for follow-up examination after completed treatment for conditions other than malignant neoplasm: Secondary | ICD-10-CM | POA: Diagnosis not present

## 2022-05-04 DIAGNOSIS — Z7901 Long term (current) use of anticoagulants: Secondary | ICD-10-CM | POA: Insufficient documentation

## 2022-05-04 DIAGNOSIS — K5730 Diverticulosis of large intestine without perforation or abscess without bleeding: Secondary | ICD-10-CM | POA: Insufficient documentation

## 2022-05-04 DIAGNOSIS — D125 Benign neoplasm of sigmoid colon: Secondary | ICD-10-CM | POA: Insufficient documentation

## 2022-05-04 DIAGNOSIS — J432 Centrilobular emphysema: Secondary | ICD-10-CM | POA: Insufficient documentation

## 2022-05-04 DIAGNOSIS — K640 First degree hemorrhoids: Secondary | ICD-10-CM | POA: Diagnosis not present

## 2022-05-04 DIAGNOSIS — G4730 Sleep apnea, unspecified: Secondary | ICD-10-CM | POA: Diagnosis not present

## 2022-05-04 DIAGNOSIS — D120 Benign neoplasm of cecum: Secondary | ICD-10-CM | POA: Diagnosis not present

## 2022-05-04 DIAGNOSIS — D122 Benign neoplasm of ascending colon: Secondary | ICD-10-CM | POA: Insufficient documentation

## 2022-05-04 DIAGNOSIS — Z86010 Personal history of colonic polyps: Secondary | ICD-10-CM | POA: Diagnosis not present

## 2022-05-04 DIAGNOSIS — D124 Benign neoplasm of descending colon: Secondary | ICD-10-CM | POA: Insufficient documentation

## 2022-05-04 DIAGNOSIS — D128 Benign neoplasm of rectum: Secondary | ICD-10-CM | POA: Insufficient documentation

## 2022-05-04 DIAGNOSIS — I10 Essential (primary) hypertension: Secondary | ICD-10-CM | POA: Diagnosis not present

## 2022-05-04 HISTORY — PX: COLONOSCOPY: SHX5424

## 2022-05-04 SURGERY — COLONOSCOPY
Anesthesia: General

## 2022-05-04 MED ORDER — PROPOFOL 500 MG/50ML IV EMUL
INTRAVENOUS | Status: DC | PRN
  Administered 2022-05-04: 150 ug/kg/min via INTRAVENOUS

## 2022-05-04 MED ORDER — PROPOFOL 1000 MG/100ML IV EMUL
INTRAVENOUS | Status: AC
  Filled 2022-05-04: qty 100

## 2022-05-04 MED ORDER — PROPOFOL 10 MG/ML IV BOLUS
INTRAVENOUS | Status: DC | PRN
  Administered 2022-05-04: 70 mg via INTRAVENOUS

## 2022-05-04 MED ORDER — SODIUM CHLORIDE 0.9 % IV SOLN
INTRAVENOUS | Status: DC
  Administered 2022-05-04: 20 mL/h via INTRAVENOUS

## 2022-05-04 MED ORDER — LIDOCAINE HCL (PF) 2 % IJ SOLN
INTRAMUSCULAR | Status: AC
  Filled 2022-05-04: qty 5

## 2022-05-04 MED ORDER — LIDOCAINE HCL (CARDIAC) PF 100 MG/5ML IV SOSY
PREFILLED_SYRINGE | INTRAVENOUS | Status: DC | PRN
  Administered 2022-05-04: 50 mg via INTRAVENOUS

## 2022-05-04 NOTE — Interval H&P Note (Signed)
History and Physical Interval Note: Preprocedure H&P from 05/04/22  was reviewed and there was no interval change after seeing and examining the patient.  Written consent was obtained from the patient after discussion of risks, benefits, and alternatives. Patient has consented to proceed with Colonoscopy with possible intervention   05/04/2022 7:30 AM  Zachary Sweeney  has presented today for surgery, with the diagnosis of Hx of adenomatous colonic polyps (Z86.010).  The various methods of treatment have been discussed with the patient and family. After consideration of risks, benefits and other options for treatment, the patient has consented to  Procedure(s): COLONOSCOPY (N/A) as a surgical intervention.  The patient's history has been reviewed, patient examined, no change in status, stable for surgery.  I have reviewed the patient's chart and labs.  Questions were answered to the patient's satisfaction.     Annamaria Helling

## 2022-05-04 NOTE — Op Note (Signed)
[facility] Gastroenterology Patient Name: Zachary Sweeney Procedure Date: 05/04/2022 6:56 AM MRN: [id number] Account #: [id number] Date of Birth: 06-18-1945 Admit Type: Outpatient Age: 77 Room: [facility] ENDO ROOM 1 Gender: Male Note Status: Finalized Instrument Name: Colonscope [id number] Procedure:             Colonoscopy Indications:           High risk colon cancer surveillance: Personal history                         of colonic polyps Providers:             Rueben Bash, DO Referring MD:          Theotis Burrow (Referring MD) Medicines:             Monitored Anesthesia Care Complications:         No immediate complications. Estimated blood loss:                         Minimal. Procedure:             Pre-Anesthesia Assessment:                        - Prior to the procedure, a History and Physical was                         performed, and patient medications and allergies were                         reviewed. The patient is competent. The risks and                         benefits of the procedure and the sedation options and                         risks were discussed with the patient. All questions                         were answered and informed consent was obtained.                         Patient identification and proposed procedure were                         verified by the physician, the nurse, the anesthetist                         and the technician in the endoscopy suite. Mental                         Status Examination: alert and oriented. Airway                         Examination: normal oropharyngeal airway and neck                         mobility. Respiratory Examination: clear to  auscultation. CV Examination: RRR, no murmurs, no S3                         or S4. Prophylactic Antibiotics: The patient does not                         require prophylactic antibiotics. Prior                          Anticoagulants: The patient has taken Eliquis                         (apixaban), last dose was 3 days prior to procedure.                         ASA Grade Assessment: III - A patient with severe                         systemic disease. After reviewing the risks and                         benefits, the patient was deemed in satisfactory                         condition to undergo the procedure. The anesthesia                         plan was to use monitored anesthesia care (MAC).                         Immediately prior to administration of medications,                         the patient was re-assessed for adequacy to receive                         sedatives. The heart rate, respiratory rate, oxygen                         saturations, blood pressure, adequacy of pulmonary                         ventilation, and response to care were monitored                         throughout the procedure. The physical status of the                         patient was re-assessed after the procedure.                        After obtaining informed consent, the colonoscope was                         passed under direct vision. Throughout the procedure,                         the patient's blood pressure, pulse, and oxygen  saturations were monitored continuously. The                         Colonoscope was introduced through the anus and                         advanced to the the cecum, identified by appendiceal                         orifice and ileocecal valve. The colonoscopy was                         performed without difficulty. The patient tolerated                         the procedure well. The quality of the bowel                         preparation was evaluated using the BBPS [facility] Bowel                         Preparation Scale) with scores of: Right Colon = 3,                         Transverse Colon = 3 and Left Colon = 3 (entire mucosa                          seen well with no residual staining, small fragments                         of stool or opaque liquid). The total BBPS score                         equals 9. The ileocecal valve, appendiceal orifice,                         and rectum were photographed. Findings:      The perianal and digital rectal examinations were normal. Pertinent       negatives include normal sphincter tone.      Multiple small-mouthed diverticula were found in the sigmoid colon.       Estimated blood loss: none.      Non-bleeding internal hemorrhoids were found during retroflexion. The       hemorrhoids were Grade I (internal hemorrhoids that do not prolapse).       Estimated blood loss: none.      Three sessile polyps were found in the transverse colon, ascending colon       and cecum. The polyps were 3 to 4 mm in size. These polyps were removed       with a cold snare. Resection and retrieval were complete. Estimated       blood loss was minimal.      14 sessile polyps were found in the rectum, sigmoid colon, descending       colon, transverse colon, ascending colon and cecum. The polyps were 1 to       2 mm in size. These polyps were removed with a jumbo cold forceps.       Resection and retrieval  were complete. Estimated blood loss was minimal.      The exam was otherwise without abnormality on direct and retroflexion       views. Impression:            - Diverticulosis in the sigmoid colon.                        - Non-bleeding internal hemorrhoids.                        - Three 3 to 4 mm polyps in the transverse colon, in                         the ascending colon and in the cecum, removed with a                         cold snare. Resected and retrieved.                        - 14 1 to 2 mm polyps in the rectum, in the sigmoid                         colon, in the descending colon, in the transverse                         colon, in the ascending colon and in the cecum,                          removed with a jumbo cold forceps. Resected and                         retrieved.                        - The examination was otherwise normal on direct and                         retroflexion views. Recommendation:        - Patient has a contact number available for                         emergencies. The signs and symptoms of potential                         delayed complications were discussed with the patient.                         Return to normal activities tomorrow. Written                         discharge instructions were provided to the patient.                        - Discharge patient to home.                        - Resume previous diet.                        -  Continue present medications.                        - No aspirin, ibuprofen, naproxen, or other                         non-steroidal anti-inflammatory drugs for 5 days after                         polyp removal.                        - Resume Eliquis (apixaban) at prior dose in 2 days.                         Refer to managing physician for further adjustment of                         therapy.                        - Await pathology results.                        - Repeat colonoscopy for surveillance based on                         pathology results.                        - Return to GI office as previously scheduled.                        - Consider genetic counseling given history of                         multiple adenomatous polyps lifetime.                        - The findings and recommendations were discussed with                         the patient. Procedure Code(s):     --- Professional ---                        [phone], Colonoscopy, flexible; with removal of                         tumor(s), polyp(s), or other lesion(s) by snare                         technique                        [id number], 68, Colonoscopy, flexible; with biopsy, single                         or  multiple Diagnosis Code(s):     --- Professional ---                        Z86.010, Personal history of colonic polyps  K64.0, First degree hemorrhoids                        D12.8, Benign neoplasm of rectum                        D12.5, Benign neoplasm of sigmoid colon                        D12.4, Benign neoplasm of descending colon                        D12.3, Benign neoplasm of transverse colon (hepatic                         flexure or splenic flexure)                        D12.2, Benign neoplasm of ascending colon                        D12.0, Benign neoplasm of cecum                        K57.30, Diverticulosis of large intestine without                         perforation or abscess without bleeding CPT copyright 2022 American Medical Association. All rights reserved. The codes documented in this report are preliminary and upon coder review may  be revised to meet current compliance requirements. Attending Participation:      I personally performed the entire procedure. Volney American, DO Annamaria Helling DO, DO 05/04/2022 8:21:44 AM This report has been signed electronically. Number of Addenda: 0 Note Initiated On: 05/04/2022 6:56 AM Scope Withdrawal Time: 0 hours 25 minutes 46 seconds  Total Procedure Duration: 0 hours 31 minutes 30 seconds  Estimated Blood Loss:  Estimated blood loss was minimal.      [facility]

## 2022-05-04 NOTE — Anesthesia Postprocedure Evaluation (Signed)
Anesthesia Post Note  Patient: Zachary Sweeney  Procedure(s) Performed: COLONOSCOPY  Patient location during evaluation: Endoscopy Anesthesia Type: General Level of consciousness: awake and alert Pain management: pain level controlled Vital Signs Assessment: post-procedure vital signs reviewed and stable Respiratory status: spontaneous breathing, nonlabored ventilation, respiratory function stable and patient connected to nasal cannula oxygen Cardiovascular status: blood pressure returned to baseline and stable Postop Assessment: no apparent nausea or vomiting Anesthetic complications: no   No notable events documented.   Last Vitals:  Vitals:   05/04/22 0829 05/04/22 0840  BP: 118/88   Pulse:    Resp:  16  Temp:    SpO2:      Last Pain:  Vitals:   05/04/22 0840  TempSrc:   PainSc: 1                  Arita Miss

## 2022-05-04 NOTE — Anesthesia Procedure Notes (Signed)
Procedure Name: General with mask airway Date/Time: 05/04/2022 7:37 AM  Performed by: Johnna Acosta, CRNAPre-anesthesia Checklist: Patient identified, Emergency Drugs available, Suction available, Patient being monitored and Timeout performed Patient Re-evaluated:Patient Re-evaluated prior to induction Oxygen Delivery Method: Supernova nasal CPAP Preoxygenation: Pre-oxygenation with 100% oxygen Induction Type: IV induction

## 2022-05-04 NOTE — Transfer of Care (Signed)
Immediate Anesthesia Transfer of Care Note  Patient: Zachary Sweeney  Procedure(s) Performed: COLONOSCOPY  Patient Location: Endoscopy Unit  Anesthesia Type:General  Level of Consciousness: sedated  Airway & Oxygen Therapy: Patient Spontanous Breathing and SUPERNOVA  Post-op Assessment: Report given to RN and Post -op Vital signs reviewed and stable  Post vital signs: Reviewed and stable  Last Vitals:  Vitals Value Taken Time  BP 105/67 05/04/22 0819  Temp    Pulse 86 05/04/22 0819  Resp 19 05/04/22 0819  SpO2 100 % 05/04/22 0819  Vitals shown include unvalidated device data.  Last Pain:  Vitals:   05/04/22 0630  TempSrc: Temporal  PainSc: 0-No pain         Complications: No notable events documented.

## 2022-05-04 NOTE — H&P (Signed)
Pre-Procedure H&P   Patient ID: Zachary Sweeney is a 77 y.o. male.  Gastroenterology Provider: Annamaria Helling, DO  Referring Provider: Dawson Bills, NP PCP: Theotis Burrow, MD  Date: 05/04/2022  HPI Mr. Zachary Sweeney is a 77 y.o. male who presents today for Colonoscopy for Surveillance-history of colon polyps .  Patient with a history of colon polyps.  Last underwent colonoscopy in June 2020 with 7 tubular adenomatous polyps ranging from 4 to 6 mm.  He also was noted to have diverticulosis in the sigmoid.  Has had other colonoscopies including 2000 2013 2019 and 2020 all with adenomatous polyps  Bowel movements are regular without melena or hematochezia constipation or diarrhea.  He is on Eliquis for history of DVT which been held for this procedure - last dose 05/01/22 (PM)  Most recent lab work hemoglobin 17.6 MCV 87.5 platelets 259,000 creatinine 1.2   Past Medical History:  Diagnosis Date   Acid reflux    Arthritis    Back injury    Colon polyp    DVT (deep venous thrombosis) ([place])    a. 06/2017 LE U/S: Right popliteal vein DVT.   Dyspnea on exertion    a. 06/2017 CTA Chest: No PE. No significant coronary Ca2+, centrilobular and paraseptal emphysema;  b.  07/2017 Echo: EF 60-65%, no rwma, Gr1 DD, mildly dil LA. Nl RV fxn; c. 07/2017 MV: Small, severe defect involving apical inf and apical segments - only on rest images consistent w/ artifact. No ischemia or scar.   Emphysema of lung ([place])    a. 06/2017 CTA Chest: Centrilobular and paraseptal emphysema, with mild geographic ground-glass opacity likely representing small airway dzs.   Erythrocytosis 04/17/2019   Hypertension    Sleep apnea    Torn ligament     Past Surgical History:  Procedure Laterality Date   COLONOSCOPY  2008   COLONOSCOPY WITH PROPOFOL N/A 02/27/2017   Procedure: COLONOSCOPY WITH PROPOFOL;  Surgeon: Robert Bellow, MD;  Location: [facility];  Service: Endoscopy;  Laterality:  N/A;   COLONOSCOPY WITH PROPOFOL N/A 07/24/2018   Procedure: COLONOSCOPY WITH PROPOFOL;  Surgeon: Virgel Manifold, MD;  Location: [facility] ENDOSCOPY;  Service: Endoscopy;  Laterality: N/A;   ESOPHAGOGASTRODUODENOSCOPY (EGD) WITH PROPOFOL N/A 02/27/2017   Procedure: ESOPHAGOGASTRODUODENOSCOPY (EGD) WITH PROPOFOL;  Surgeon: Robert Bellow, MD;  Location: [facility];  Service: Endoscopy;  Laterality: N/A;   ESOPHAGOGASTRODUODENOSCOPY (EGD) WITH PROPOFOL N/A 07/24/2018   Procedure: ESOPHAGOGASTRODUODENOSCOPY (EGD) WITH PROPOFOL;  Surgeon: Virgel Manifold, MD;  Location: [facility] END[facility];  Service: Endoscopy;  Laterality: N/A;   HERNIA REPAIR Right    inguinal   INSERTION OF MESH  02/01/2021   Procedure: INSERTION OF MESH;  Surgeon: Herbert Pun, MD;  Location: [facility] ORS;  Service: General;;   KNEE SURGERY Left    NECK SURGERY     ROTATOR CUFF REPAIR Right    SHOULDER ARTHROSCOPY WITH SUBACROMIAL DECOMPRESSION, ROTATOR CUFF REPAIR AND BICEP TENDON REPAIR Left 07/25/2021   Procedure: LEFT SHOULDER ARTHROSCOPY WITH DEBRIDEMENT, DECOMPRESSION, DISTAL CLAVICLE EXCISION, BICEPS TENODESIS;  Surgeon: Corky Mull, MD;  Location: [facility] OR[facility];  Service: Orthopedics;  Laterality: Left;   UMBILICAL HERNIA REPAIR N/A 04/22/2019   Procedure: HERNIA REPAIR UMBILICAL ADULT;  Surgeon: Ronny Bacon, MD;  Location: [facility];  Service: General;  Laterality: N/A;    Family History No h/o GI disease or malignancy  Review of Systems  Constitutional:  Negative for activity change, appetite change, chills, diaphoresis, fatigue, fever  and unexpected weight change.  HENT:  Negative for trouble swallowing and voice change.   Respiratory:  Negative for shortness of breath and wheezing.   Cardiovascular:  Negative for chest pain, palpitations and leg swelling.  Gastrointestinal:  Negative for abdominal distention, abdominal pain, anal bleeding, blood in stool, constipation, diarrhea, nausea and vomiting.   Musculoskeletal:  Negative for arthralgias and myalgias.  Skin:  Negative for color change and pallor.  Neurological:  Negative for dizziness, syncope and weakness.  Psychiatric/Behavioral:  Negative for confusion. The patient is not nervous/anxious.   All other systems reviewed and are negative.    Medications No current facility-administered medications on file prior to encounter.   Current Outpatient Medications on File Prior to Encounter  Medication Sig Dispense Refill   acetaminophen (TYLENOL) 500 MG tablet Take 1,000 mg by mouth every 6 (six) hours as needed for fever.     diltiazem (CARDIZEM CD) 300 MG 24 hr capsule Take 1 capsule (300 mg total) by mouth daily. 90 capsule 3   fluocinonide (LIDEX) 0.05 % external solution Apply 1 application topically daily.     omeprazole (PRILOSEC) 20 MG capsule Take 1 capsule (20 mg total) by mouth daily.     umeclidinium-vilanterol (ANORO ELLIPTA) 62.5-25 MCG/INH AEPB Inhale 1 puff into the lungs daily.     albuterol (VENTOLIN HFA) 108 (90 Base) MCG/ACT inhaler 2 puffs every 4 (four) hours as needed for shortness of breath or wheezing. (Patient not taking: Reported on 12/14/2021)     losartan (COZAAR) 25 MG tablet Take 25 mg by mouth daily. (Patient not taking: Reported on 12/14/2021)     oxyCODONE (ROXICODONE) 5 MG immediate release tablet Take 1-2 tablets (5-10 mg total) by mouth every 4 (four) hours as needed for severe pain or moderate pain. (Patient not taking: Reported on 12/14/2021) 40 tablet 0   simethicone (MYLICON) 80 MG chewable tablet Chew 1 tablet (80 mg total) by mouth 4 (four) times daily as needed (abdominal bloating or gas pains). (Patient not taking: Reported on 12/14/2021) 30 tablet 0    Pertinent medications related to GI and procedure were reviewed by me with the patient prior to the procedure   Current Facility-Administered Medications:    0.9 %  sodium chloride infusion, , Intravenous, Continuous, Annamaria Helling,  DO, Last Rate: 20 mL/hr at 05/04/22 [id number], Restarted at 05/04/22 0707  sodium chloride 20 mL/hr (05/04/22 0646)       No Known Allergies Allergies were reviewed by me prior to the procedure  Objective   Body mass index is 40.14 kg/m. Vitals:   05/04/22 0630  BP: (!) 164/91  Pulse: 95  Resp: 20  Temp: (!) 97.4 F (36.3 C)  TempSrc: Temporal  SpO2: 97%  Weight: 134.3 kg  Height: 6' (1.829 m)     Physical Exam Vitals and nursing note reviewed.  Constitutional:      General: He is not in acute distress.    Appearance: Normal appearance. He is obese. He is not ill-appearing, toxic-appearing or diaphoretic.  HENT:     Head: Normocephalic and atraumatic.     Nose: Nose normal.     Mouth/Throat:     Mouth: Mucous membranes are moist.     Pharynx: Oropharynx is clear.  Eyes:     General: No scleral icterus.    Extraocular Movements: Extraocular movements intact.  Cardiovascular:     Rate and Rhythm: Regular rhythm. Tachycardia present.     Heart sounds: Normal heart sounds. No murmur  heard.    No friction rub. No gallop.  Pulmonary:     Effort: Pulmonary effort is normal. No respiratory distress.     Breath sounds: Normal breath sounds. No wheezing, rhonchi or rales.  Abdominal:     General: Bowel sounds are normal. There is no distension.     Palpations: Abdomen is soft.     Tenderness: There is no abdominal tenderness. There is no guarding or rebound.  Musculoskeletal:     Cervical back: Neck supple.     Right lower leg: No edema.     Left lower leg: No edema.  Skin:    General: Skin is warm and dry.     Coloration: Skin is not jaundiced or pale.  Neurological:     General: No focal deficit present.     Mental Status: He is alert and oriented to person, place, and time. Mental status is at baseline.  Psychiatric:        Mood and Affect: Mood normal.        Behavior: Behavior normal.        Thought Content: Thought content normal.        Judgment: Judgment  normal.      Assessment:  Mr. Zachary Sweeney is a 77 y.o. male  who presents today for Colonoscopy for Surveillance-history of colon polyps .  Plan:  Colonoscopy with possible intervention today  Colonoscopy with possible biopsy, control of bleeding, polypectomy, and interventions as necessary has been discussed with the patient/patient representative. Informed consent was obtained from the patient/patient representative after explaining the indication, nature, and risks of the procedure including but not limited to death, bleeding, perforation, missed neoplasm/lesions, cardiorespiratory compromise, and reaction to medications. Opportunity for questions was given and appropriate answers were provided. Patient/patient representative has verbalized understanding is amenable to undergoing the procedure.   Annamaria Helling, DO  [facility]  Portions of the record may have been created with voice recognition software. Occasional wrong-word or 'sound-a-like' substitutions may have occurred due to the inherent limitations of voice recognition software.  Read the chart carefully and recognize, using context, where substitutions may have occurred.

## 2022-05-04 NOTE — Anesthesia Preprocedure Evaluation (Addendum)
Anesthesia Evaluation  Patient identified by MRN, date of birth, ID band Patient awake    Reviewed: Allergy & Precautions, NPO status , Patient's Chart, lab work & pertinent test results  History of Anesthesia Complications Negative for: history of anesthetic complications  Airway Mallampati: IV  TM Distance: >3 FB Neck ROM: Full    Dental  (+) Upper Dentures, Lower Dentures   Pulmonary sleep apnea and Continuous Positive Airway Pressure Ventilation , COPD,  COPD inhaler, Patient abstained from smoking.Not current smoker, former smoker Daily maintenance inhaler, rare rescue inhaler.    Pulmonary exam normal breath sounds clear to auscultation       Cardiovascular Exercise Tolerance: Good METShypertension, Pt. on medications + DOE  (-) CAD and (-) Past MI (-) dysrhythmias  Rhythm:Regular Rate:Normal - Systolic murmurs    Neuro/Psych negative neurological ROS  negative psych ROS   GI/Hepatic ,GERD  Controlled,,(+)     (-) substance abuse    Endo/Other  neg diabetes  Morbid obesity  Renal/GU negative Renal ROS     Musculoskeletal   Abdominal  (+) + obese  Peds  Hematology   Anesthesia Other Findings Past Medical History: No date: Acid reflux No date: Arthritis No date: Back injury No date: Colon polyp No date: DVT (deep venous thrombosis) ([place])     Comment:  a. 06/2017 LE U/S: Right popliteal vein DVT. No date: Dyspnea on exertion     Comment:  a. 06/2017 CTA Chest: No PE. No significant coronary               Ca2+, centrilobular and paraseptal emphysema;  b.  07/2017              Echo: EF 60-65%, no rwma, Gr1 DD, mildly dil LA. Nl RV               fxn; c. 07/2017 MV: Small, severe defect involving apical               inf and apical segments - only on rest images consistent               w/ artifact. No ischemia or scar. No date: Emphysema of lung ([place])     Comment:  a. 06/2017 CTA Chest: Centrilobular and  paraseptal               emphysema, with mild geographic ground-glass opacity               likely representing small airway dzs. 04/17/2019: Erythrocytosis No date: Hypertension No date: Sleep apnea No date: Torn ligament  Reproductive/Obstetrics                             Anesthesia Physical Anesthesia Plan  ASA: 3  Anesthesia Plan: General   Post-op Pain Management: Minimal or no pain anticipated   Induction: Intravenous  PONV Risk Score and Plan: 2 and Propofol infusion, TIVA and Ondansetron  Airway Management Planned: Nasal CPAP  Additional Equipment: None  Intra-op Plan:   Post-operative Plan:   Informed Consent: I have reviewed the patients History and Physical, chart, labs and discussed the procedure including the risks, benefits and alternatives for the proposed anesthesia with the patient or authorized representative who has indicated his/her understanding and acceptance.     Dental advisory given  Plan Discussed with: CRNA and Surgeon  Anesthesia Plan Comments: (Discussed risks of anesthesia with patient, including possibility of difficulty with spontaneous ventilation  under anesthesia necessitating airway intervention, PONV, and rare risks such as cardiac or respiratory or neurological events, and allergic reactions. Discussed the role of CRNA in patient's perioperative care. Patient understands. Patient informed about increased incidence of above perioperative risk due to high BMI. Patient understands.  )       Anesthesia Quick Evaluation

## 2022-05-07 ENCOUNTER — Encounter: Payer: Self-pay | Admitting: Gastroenterology

## 2022-05-07 LAB — SURGICAL PATHOLOGY

## 2022-05-08 ENCOUNTER — Ambulatory Visit: Payer: 59 | Attending: Surgery | Admitting: Occupational Therapy

## 2022-06-18 ENCOUNTER — Inpatient Hospital Stay: Payer: 59

## 2022-06-18 ENCOUNTER — Inpatient Hospital Stay: Payer: 59 | Admitting: Oncology

## 2022-06-26 DIAGNOSIS — I700 Atherosclerosis of aorta: Secondary | ICD-10-CM | POA: Insufficient documentation

## 2022-06-28 ENCOUNTER — Encounter: Payer: Self-pay | Admitting: Oncology

## 2022-07-13 DIAGNOSIS — I82431 Acute embolism and thrombosis of right popliteal vein: Secondary | ICD-10-CM | POA: Insufficient documentation

## 2022-07-18 ENCOUNTER — Inpatient Hospital Stay (HOSPITAL_BASED_OUTPATIENT_CLINIC_OR_DEPARTMENT_OTHER): Payer: 59 | Admitting: Oncology

## 2022-07-18 ENCOUNTER — Inpatient Hospital Stay: Payer: 59 | Attending: Oncology

## 2022-07-18 ENCOUNTER — Encounter: Payer: Self-pay | Admitting: Oncology

## 2022-07-18 VITALS — BP 148/85 | HR 73 | Temp 97.0°F | Wt 295.0 lb

## 2022-07-18 DIAGNOSIS — Z86718 Personal history of other venous thrombosis and embolism: Secondary | ICD-10-CM | POA: Insufficient documentation

## 2022-07-18 DIAGNOSIS — Z87891 Personal history of nicotine dependence: Secondary | ICD-10-CM | POA: Insufficient documentation

## 2022-07-18 DIAGNOSIS — Z8719 Personal history of other diseases of the digestive system: Secondary | ICD-10-CM | POA: Diagnosis not present

## 2022-07-18 DIAGNOSIS — D751 Secondary polycythemia: Secondary | ICD-10-CM | POA: Diagnosis present

## 2022-07-18 DIAGNOSIS — Z79899 Other long term (current) drug therapy: Secondary | ICD-10-CM | POA: Insufficient documentation

## 2022-07-18 DIAGNOSIS — K635 Polyp of colon: Secondary | ICD-10-CM

## 2022-07-18 DIAGNOSIS — Z7901 Long term (current) use of anticoagulants: Secondary | ICD-10-CM | POA: Diagnosis not present

## 2022-07-18 LAB — COMPREHENSIVE METABOLIC PANEL
ALT: 15 U/L (ref 0–44)
AST: 22 U/L (ref 15–41)
Albumin: 4 g/dL (ref 3.5–5.0)
Alkaline Phosphatase: 50 U/L (ref 38–126)
Anion gap: 8 (ref 5–15)
BUN: 11 mg/dL (ref 8–23)
CO2: 24 mmol/L (ref 22–32)
Calcium: 8.8 mg/dL — ABNORMAL LOW (ref 8.9–10.3)
Chloride: 104 mmol/L (ref 98–111)
Creatinine, Ser: 1.19 mg/dL (ref 0.61–1.24)
GFR, Estimated: >60 mL/min (ref >60)
Glucose, Bld: 104 mg/dL — ABNORMAL HIGH (ref 70–99)
Potassium: 3.3 mmol/L — ABNORMAL LOW (ref 3.5–5.1)
Sodium: 136 mmol/L (ref 135–145)
Total Bilirubin: 0.8 mg/dL (ref 0.3–1.2)
Total Protein: 7.4 g/dL (ref 6.5–8.1)

## 2022-07-18 LAB — CBC WITH DIFFERENTIAL/PLATELET
Abs Immature Granulocytes: 0.02 10*3/uL (ref 0.00–0.07)
Basophils Absolute: 0.1 10*3/uL (ref 0.0–0.1)
Basophils Relative: 1 %
Eosinophils Absolute: 0.1 10*3/uL (ref 0.0–0.5)
Eosinophils Relative: 2 %
HCT: 47 % (ref 39.0–52.0)
Hemoglobin: 16.1 g/dL (ref 13.0–17.0)
Immature Granulocytes: 0 %
Lymphocytes Relative: 36 %
Lymphs Abs: 2.5 10*3/uL (ref 0.7–4.0)
MCH: 29.4 pg (ref 26.0–34.0)
MCHC: 34.3 g/dL (ref 30.0–36.0)
MCV: 85.9 fL (ref 80.0–100.0)
Monocytes Absolute: 0.5 10*3/uL (ref 0.1–1.0)
Monocytes Relative: 7 %
Neutro Abs: 3.9 10*3/uL (ref 1.7–7.7)
Neutrophils Relative %: 54 %
Platelets: 158 10*3/uL (ref 150–400)
RBC: 5.47 MIL/uL (ref 4.22–5.81)
RDW: 13.5 % (ref 11.5–15.5)
WBC: 7.1 10*3/uL (ref 4.0–10.5)
nRBC: 0 % (ref 0.0–0.2)

## 2022-07-18 MED ORDER — APIXABAN 2.5 MG PO TABS: 2.5000 mg | ORAL_TABLET | Freq: Two times a day (BID) | ORAL | 1 refills | Status: DC

## 2022-07-18 NOTE — Progress Notes (Signed)
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 Patient is worried that the reason his RBC count keeps rising is because he isn't using his CPAP machine at night like he is supposed to because he is scared of getting an infection due to not keeping the tubing of the machine cleaned. So, he is wanting to know how to properly clean the CPAP machine hose.

## 2022-07-18 NOTE — Assessment & Plan Note (Signed)
He has multiple colon polyps.  Recommend genetic testing.

## 2022-07-18 NOTE — Assessment & Plan Note (Signed)
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  Encourage patient to use CPAP machine Hemoglobin is stable.  No need for phlebotomy at this point.

## 2022-07-18 NOTE — Assessment & Plan Note (Signed)
#  History of recurrent DVT. Continue Eliquis 2.5 mg twice daily for maintenance.  He tolerates well.  Prescription refilled. Chronic lower extremity edema secondary to venous insufficiency.  Continue compression stocking. 

## 2022-07-18 NOTE — Progress Notes (Signed)
Hematology/Oncology Progress note [phone] [phone]      Patient Care Team: Zachary Fleeting, MD as PCP - General (Family Medicine) End, Zachary Deer, MD as PCP - Cardiology (Cardiology)   ASSESSMENT & PLAN:   History of DVT of lower extremity #History of recurrent DVT. Continue Eliquis 2.5 mg twice daily for maintenance.  He tolerates well.  Prescription refilled. Chronic lower extremity edema secondary to venous insufficiency.  Continue compression stocking.  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  Encourage patient to use CPAP machine Hemoglobin is stable.  No need for phlebotomy at this point.  Polyp of colon He has multiple colon polyps.  Recommend genetic testing.  Orders Placed This Encounter  Procedures   CMP (Cancer Center only)    Standing Status:   Future    Standing Expiration Date:   07/18/2023   CBC with Differential (Cancer Center Only)    Standing Status:   Future    Standing Expiration Date:   07/18/2023   Follow up in 6 months.   All questions were answered. The patient knows to call the clinic with any problems, questions or concerns.  Zachary Patience, MD, PhD [facility] Hematology Oncology 07/18/2022   CHIEF COMPLAINTS/REASON FOR VISIT:  Follow-up for DVT  HISTORY OF PRESENTING ILLNESS:  Zachary Sweeney is a  77 y.o.  male presents for DVT, chronic anticoagulation.  Patient recently established care with gastroenterology for consultation and management of chronic history of diffuse abdominal pain as well as epigastric pain.  Not related to meals, exacerbated when he bends down.  Not associated with nausea or vomiting. Denies any weight loss, fever, chills, night sweats.  Appetite is fair. Denies hematochezia, hematuria, hematemesis, epistaxis, black tarry stool or easy bruising.  Endoscopy procedures were recommended for further evaluation.  Patient is currently on chronic anticoagulation for history  of recurrent DVT.  Patient was referred by gastroenterology to me for further evaluation and peri-procedure anticoagulation recommendation. Patient also was found to have an abdominal wall hernia which caused this pain with bending down.  He was also referred to see surgery Dr. Aleen Sweeney for further evaluation.  06/25/2017, patient had US venous bilateral lower extremity which showed a focal area of partial compressibility popliteal Vein on the right, compatible with a partially occlusive DVT.  The borders are smooth and this has a subacute appearance. Left lower extremity no DVT.   Patient recalls that he had right foot pain and swelling, as the initial presentation which prompted ultrasound evaluation. Patient was started on Eliquis after ED discussed his case with vascular surgery Dr. Wyn Sweeney. Patient was taken off Eliquis in November 2019. Presented to [facility] emergency room on 03/30/2018 complaining right lower extremity pain and swelling again which is similar to when he had a blood clot.  Patient was concerned that he could have developed blood clots again so he restarted taking Eliquis for the past week prior to presenting to the emergency room.  He mentions to a friend at the church and they urged him to go to emergency room. In the ED, ultrasound showed nonocclusive right lower extremity DVT. 04/10/2018, he had CT abdomen pelvis done for evaluation of chronic abdominal pain.  CT showed no acute findings in the abdomen and pelvis.  Nonobstructive punctate stones over the lower pole collecting system of the right kidney.  Small umbilical hernia containing only peritoneal fat.  Aortic atherosclerosis.  Patient reports feeling well today.  Right lower extremity pain and swelling has improved.  He is taking Eliquis 5 mg twice daily since February 2020.  Tolerates well.  No bleeding events.  #  GI work-up done on 07/24/2018. Colonoscopy showed colon polyps which were resected and retrieved.  Diverticulosis.   Biopsy showed tubular adenoma.  Recommend repeat colonoscopy in 3 years negative for high-grade dysplasia and malignancy. EGD showed salmon-colored mucosa suggestive of Barrett's esophagus.  Biopsied.  Per GI note, biopsy showed evidence of acid reflux.  Patient was started on Pepcid.  #  07/10/2018 US venous lower extremity unilateral right findings compatible with resolving isolated right popliteal DVT. Recommend patient to be switched to Eliquis 2.5 mg twice daily for long-term anticoagulation maintenance.  # sleep Apnea INTERVAL HISTORY Zachary Sweeney is a 77 y.o. male who has above history reviewed by me today presents for follow up visit for DVT and erythrocytosis.  Patient was last seen 1 year ago and presents to establish care. Problems and complaints are listed below: He takes eliquis 2.5mg  BID.  Patient denies any bleeding events Continues to have intermittent right lower extremity swelling which is chronic. 05/04/2022, patient had a colonoscopy, patient was found to have multiple colon polyps, pathology were negative for high-grade dysplastic malignancy.  Hyper plastic polyp, tubular adenoma and sessile serrated polyps. Patient denies any family history of colon cancer.  Maternal uncle was diagnosed with prostate cancer.   Review of Systems  Constitutional:  Negative for appetite change, chills, fatigue, fever and unexpected weight change.  HENT:   Negative for hearing loss and voice change.   Eyes:  Negative for eye problems and icterus.  Respiratory:  Negative for chest tightness, cough and shortness of breath.   Cardiovascular:  Positive for leg swelling. Negative for chest pain.  Gastrointestinal:  Negative for abdominal distention, abdominal pain, nausea and vomiting.  Endocrine: Negative for hot flashes.  Genitourinary:  Negative for difficulty urinating, dysuria and frequency.   Musculoskeletal:  Negative for arthralgias.  Skin:  Negative for itching and rash.   Neurological:  Negative for light-headedness and numbness.  Hematological:  Negative for adenopathy. Does not bruise/bleed easily.  Psychiatric/Behavioral:  Negative for confusion.     MEDICAL HISTORY:  Past Medical History:  Diagnosis Date   Acid reflux    Arthritis    Back injury    Colon polyp    DVT (deep venous thrombosis) (HCC)    a. 06/2017 LE U/S: Right popliteal vein DVT.   Dyspnea on exertion    a. 06/2017 CTA Chest: No PE. No significant coronary Ca2+, centrilobular and paraseptal emphysema;  b.  07/2017 Echo: EF 60-65%, no rwma, Gr1 DD, mildly dil LA. Nl RV fxn; c. 07/2017 MV: Small, severe defect involving apical inf and apical segments - only on rest images consistent w/ artifact. No ischemia or scar.   Emphysema of lung (HCC)    a. 06/2017 CTA Chest: Centrilobular and paraseptal emphysema, with mild geographic ground-glass opacity likely representing small airway dzs.   Erythrocytosis 04/17/2019   Hypertension    Sleep apnea    Torn ligament     SURGICAL HISTORY: Past Surgical History:  Procedure Laterality Date   COLONOSCOPY  2008   COLONOSCOPY N/A 05/04/2022   Procedure: COLONOSCOPY;  Surgeon: Jaynie Collins, DO;  Location: [facility] ENDOSCOPY;  Service: Gastroenterology;  Laterality: N/A;   COLONOSCOPY WITH PROPOFOL N/A 02/27/2017   Procedure: COLONOSCOPY WITH PROPOFOL;  Surgeon: Earline Mayotte, MD;  Location: [facility] ENDOSCOPY;  Service: Endoscopy;  Laterality: N/A;   COLONOSCOPY WITH PROPOFOL N/A  07/24/2018   Procedure: COLONOSCOPY WITH PROPOFOL;  Surgeon: Pasty Spillers, MD;  Location: [facility] ENDOSCOPY;  Service: Endoscopy;  Laterality: N/A;   ESOPHAGOGASTRODUODENOSCOPY (EGD) WITH PROPOFOL N/A 02/27/2017   Procedure: ESOPHAGOGASTRODUODENOSCOPY (EGD) WITH PROPOFOL;  Surgeon: Earline Mayotte, MD;  Location: [facility] ENDOSCOPY;  Service: Endoscopy;  Laterality: N/A;   ESOPHAGOGASTRODUODENOSCOPY (EGD) WITH PROPOFOL N/A 07/24/2018   Procedure: ESOPHAGOGASTRODUODENOSCOPY  (EGD) WITH PROPOFOL;  Surgeon: Pasty Spillers, MD;  Location: [facility] ENDOSCOPY;  Service: Endoscopy;  Laterality: N/A;   HERNIA REPAIR Right    inguinal   INSERTION OF MESH  02/01/2021   Procedure: INSERTION OF MESH;  Surgeon: Carolan Shiver, MD;  Location: [facility] ORS;  Service: General;;   KNEE SURGERY Left    NECK SURGERY     ROTATOR CUFF REPAIR Right    SHOULDER ARTHROSCOPY WITH SUBACROMIAL DECOMPRESSION, ROTATOR CUFF REPAIR AND BICEP TENDON REPAIR Left 07/25/2021   Procedure: LEFT SHOULDER ARTHROSCOPY WITH DEBRIDEMENT, DECOMPRESSION, DISTAL CLAVICLE EXCISION, BICEPS TENODESIS;  Surgeon: Christena Flake, MD;  Location: [facility] ORS;  Service: Orthopedics;  Laterality: Left;   UMBILICAL HERNIA REPAIR N/A 04/22/2019   Procedure: HERNIA REPAIR UMBILICAL ADULT;  Surgeon: Campbell Lerner, MD;  Location: [facility] ORS;  Service: General;  Laterality: N/A;    SOCIAL HISTORY: Social History   Socioeconomic History   Marital status: Single    Spouse name: Not on file   Number of children: Not on file   Years of education: Not on file   Highest education level: Not on file  Occupational History   Occupation: retired  Tobacco Use   Smoking status: Former    Packs/day: 0.25    Years: 30.00    Additional pack years: 0.00    Total pack years: 7.50    Types: Cigarettes    Quit date: 02/19/1994    Years since quitting: 28.4   Smokeless tobacco: Never  Vaping Use   Vaping Use: Never used  Substance and Sexual Activity   Alcohol use: No   Drug use: No   Sexual activity: Not on file  Other Topics Concern   Not on file  Social History Narrative   Lives alone   Social Determinants of Health   Financial Resource Strain: Not on file  Food Insecurity: Not on file  Transportation Needs: Not on file  Physical Activity: Not on file  Stress: Not on file  Social Connections: Not on file  Intimate Partner Violence: Not on file    FAMILY HISTORY: Family History  Problem Relation Age of Onset    Lung cancer Mother    Heart disease Father    Prostate cancer Maternal Uncle     ALLERGIES:  has No Known Allergies.  MEDICATIONS:  Current Outpatient Medications  Medication Sig Dispense Refill   acetaminophen (TYLENOL) 500 MG tablet Take 1,000 mg by mouth every 6 (six) hours as needed for fever.     albuterol (VENTOLIN HFA) 108 (90 Base) MCG/ACT inhaler 2 puffs every 4 (four) hours as needed for shortness of breath or wheezing.     clobetasol cream (TEMOVATE) 0.05 %      diltiazem (CARDIZEM CD) 300 MG 24 hr capsule Take 1 capsule (300 mg total) by mouth daily. 90 capsule 3   fluocinonide (LIDEX) 0.05 % external solution Apply 1 application topically daily.     ketoconazole (NIZORAL) 2 % cream Apply topically daily.     losartan (COZAAR) 50 MG tablet Take 50 mg by mouth daily.     metoprolol  succinate (TOPROL-XL) 50 MG 24 hr tablet      omeprazole (PRILOSEC) 20 MG capsule Take 1 capsule (20 mg total) by mouth daily.     umeclidinium-vilanterol (ANORO ELLIPTA) 62.5-25 MCG/INH AEPB Inhale 1 puff into the lungs daily.     VIAGRA 50 MG tablet SMARTSIG:1 Tablet(s) By Mouth     apixaban (ELIQUIS) 2.5 MG TABS tablet Take 1 tablet (2.5 mg total) by mouth 2 (two) times daily. 180 tablet 1   omeprazole (PRILOSEC) 20 MG capsule TAKE 1 CAPSULE BY MOUTH EVERY DAY FOR REFLUX (Patient not taking: Reported on 12/14/2021)     oxyCODONE (ROXICODONE) 5 MG immediate release tablet Take 1-2 tablets (5-10 mg total) by mouth every 4 (four) hours as needed for severe pain or moderate pain. (Patient not taking: Reported on 12/14/2021) 40 tablet 0   simethicone (MYLICON) 80 MG chewable tablet Chew 1 tablet (80 mg total) by mouth 4 (four) times daily as needed (abdominal bloating or gas pains). (Patient not taking: Reported on 12/14/2021) 30 tablet 0   No current facility-administered medications for this visit.     PHYSICAL EXAMINATION:  Vitals:   07/18/22 1350  BP: (!) 148/85  Pulse: 73  Temp: (!) 97  F (36.1 C)  SpO2: 98%   Filed Weights   07/18/22 1350  Weight: 295 lb (133.8 kg)    Physical Exam Constitutional:      General: He is not in acute distress.    Appearance: He is obese.  HENT:     Head: Normocephalic and atraumatic.  Eyes:     General: No scleral icterus.    Pupils: Pupils are equal, round, and reactive to light.  Cardiovascular:     Rate and Rhythm: Normal rate and regular rhythm.     Heart sounds: Normal heart sounds.  Pulmonary:     Effort: Pulmonary effort is normal. No respiratory distress.     Breath sounds: No wheezing.  Abdominal:     General: Bowel sounds are normal. There is no distension.     Palpations: Abdomen is soft. There is no mass.     Tenderness: There is no abdominal tenderness.  Musculoskeletal:        General: No deformity. Normal range of motion.     Cervical back: Normal range of motion and neck supple.     Comments: Trace bilateral lower extremity edema.   Skin:    General: Skin is warm and dry.     Findings: No erythema or rash.  Neurological:     Mental Status: He is alert and oriented to person, place, and time. Mental status is at baseline.     Cranial Nerves: No cranial nerve deficit.     Coordination: Coordination normal.  Psychiatric:        Mood and Affect: Mood normal.        Behavior: Behavior normal.        Thought Content: Thought content normal.     RADIOGRAPHIC STUDIES: I have personally reviewed the radiological images as listed and agreed with the findings in the report. No results found.     LABORATORY DATA:  I have reviewed the data as listed    Latest Ref Rng & Units 07/18/2022    1:22 PM 12/14/2021   10:30 AM 05/01/2021    4:10 AM  CBC  WBC 4.0 - 10.5 K/uL 7.1  7.6  6.4   Hemoglobin 13.0 - 17.0 g/dL 82.9  56.2  13.0   Hematocrit 39.0 -  52.0 % 47.0  50.5  50.4   Platelets 150 - 400 K/uL 158  159  154       Latest Ref Rng & Units 07/18/2022    1:22 PM 12/14/2021   10:30 AM 04/30/2021    5:48  AM  CMP  Glucose 70 - 99 mg/dL 409  811  97   BUN 8 - 23 mg/dL 11  11  8    Creatinine 0.61 - 1.24 mg/dL 9.14  7.82  9.56   Sodium 135 - 145 mmol/L 136  138  136   Potassium 3.5 - 5.1 mmol/L 3.3  3.9  3.4   Chloride 98 - 111 mmol/L 104  106  103   CO2 22 - 32 mmol/L 24  25  27    Calcium 8.9 - 10.3 mg/dL 8.8  9.3  9.3   Total Protein 6.5 - 8.1 g/dL 7.4  7.7    Total Bilirubin 0.3 - 1.2 mg/dL 0.8  0.7    Alkaline Phos 38 - 126 U/L 50  52    AST 15 - 41 U/L 22  24    ALT 0 - 44 U/L 15  19

## 2022-07-19 ENCOUNTER — Inpatient Hospital Stay: Payer: 59 | Admitting: Licensed Clinical Social Worker

## 2022-07-19 ENCOUNTER — Inpatient Hospital Stay: Payer: 59

## 2022-08-14 ENCOUNTER — Inpatient Hospital Stay: Payer: 59 | Admitting: Licensed Clinical Social Worker

## 2022-08-14 ENCOUNTER — Inpatient Hospital Stay: Payer: 59

## 2022-09-04 ENCOUNTER — Other Ambulatory Visit: Payer: Self-pay | Admitting: Family Medicine

## 2022-09-04 DIAGNOSIS — N189 Chronic kidney disease, unspecified: Secondary | ICD-10-CM

## 2022-09-11 ENCOUNTER — Ambulatory Visit
Admission: RE | Admit: 2022-09-11 | Discharge: 2022-09-11 | Disposition: A | Discharge status: Home / Self Care | Payer: 59 | Source: Ambulatory Visit | Attending: Family Medicine | Admitting: Family Medicine

## 2022-09-11 DIAGNOSIS — N189 Chronic kidney disease, unspecified: Secondary | ICD-10-CM | POA: Insufficient documentation

## 2022-10-28 IMAGING — MR MR SHOULDER*L* W/O CM
6 of 7 series · 30 of 40 positions shown · non-contrast
Comparison: None.

CLINICAL DATA: Left shoulder pain. Fell on left shoulder 3 months
ago.

EXAM:
MRI OF THE LEFT SHOULDER WITHOUT CONTRAST
TECHNIQUE: Multiplanar, multisequence MR imaging of the shoulder was performed.
No intravenous contrast was administered.

[Series 13: T2 fat-sat · axial · left · 4.0mm · 0.44mm/px · z∈[-108,+5]mm · 7 of 26 slices shown (1 of 3)]
[im 1/26]
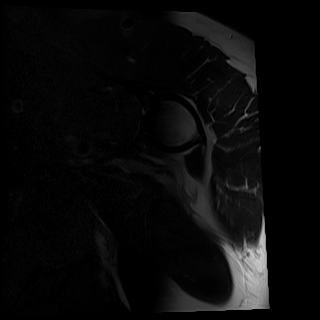
[im 5/26]
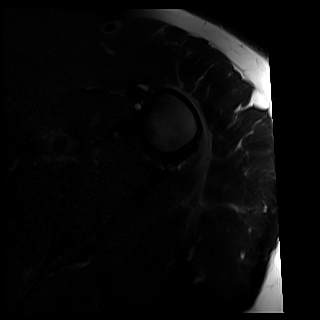
[im 9/26]
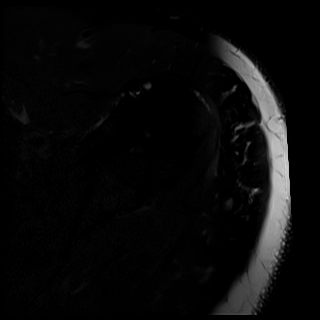
[im 13/26]
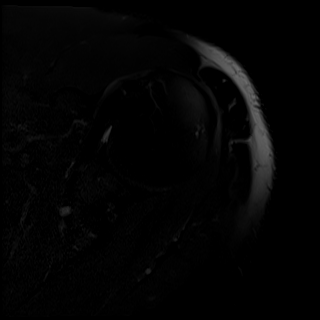
[im 17/26]
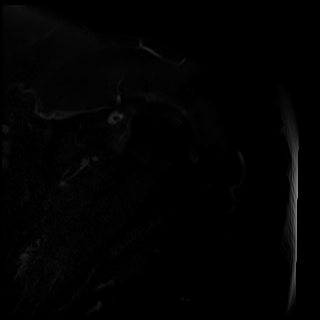
[im 21/26]
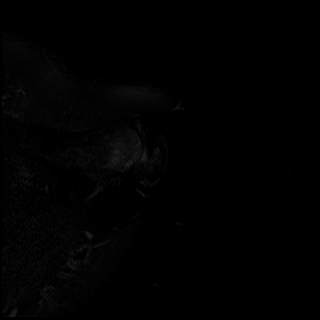
[im 26/26]
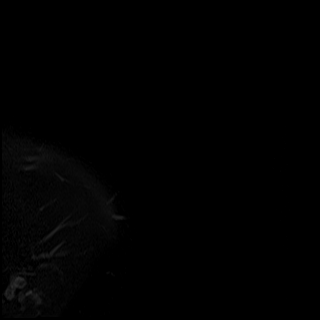

[Series 14: PD · oblique · left · 4.0mm · 0.44mm/px · 6 of 26 slices shown]
[im 1/26]
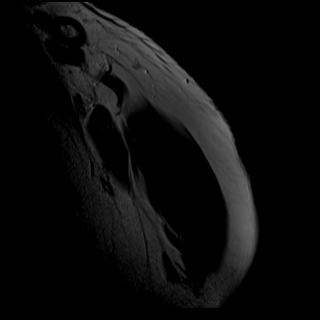
[im 6/26]
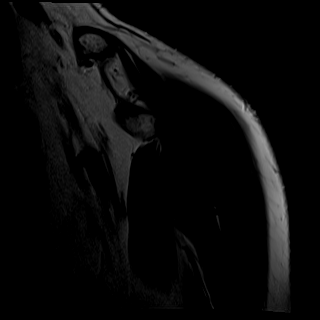
[im 11/26]
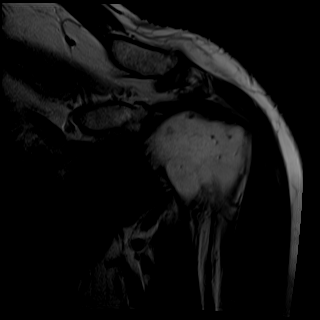
[im 16/26]
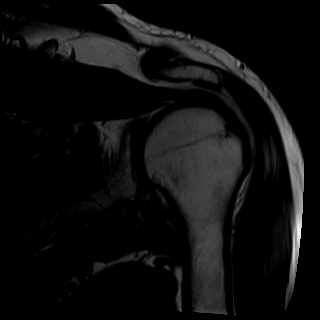
[im 21/26]
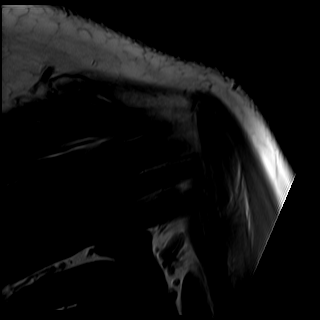
[im 26/26]
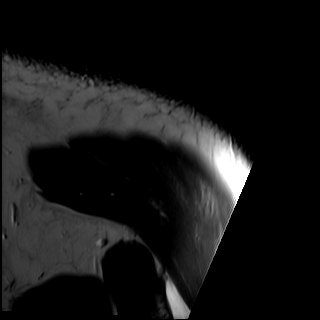

[Series 15: T2 fat-sat · oblique · left · 4.0mm · 0.44mm/px · 6 of 26 slices shown (2 of 3)]
[im 1/26]
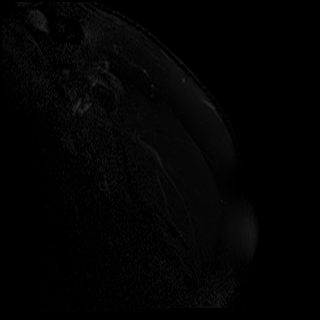
[im 6/26]
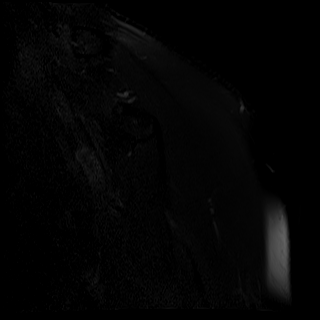
[im 11/26]
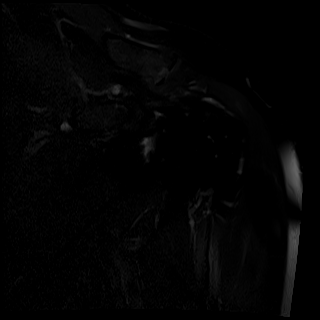
[im 16/26]
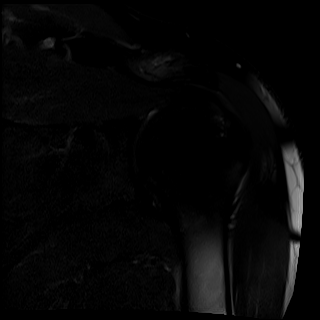
[im 21/26]
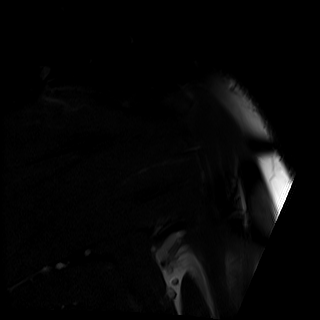
[im 26/26]
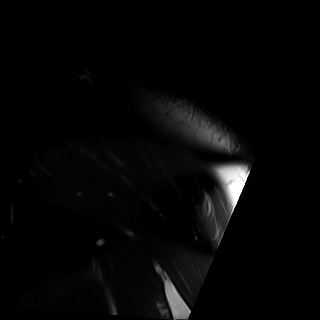

[Series 16: T2 fat-sat · oblique · left · 4.0mm · 0.22mm/px · 5 of 22 slices shown (3 of 3)]
[im 1/22]
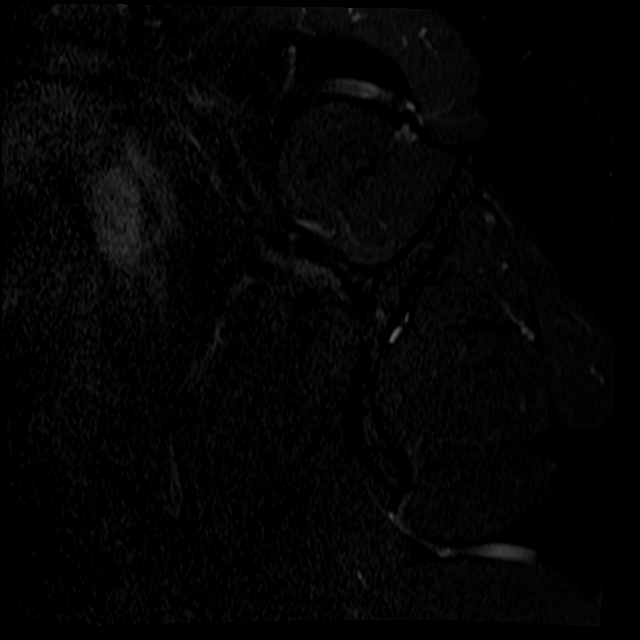
[im 6/22]
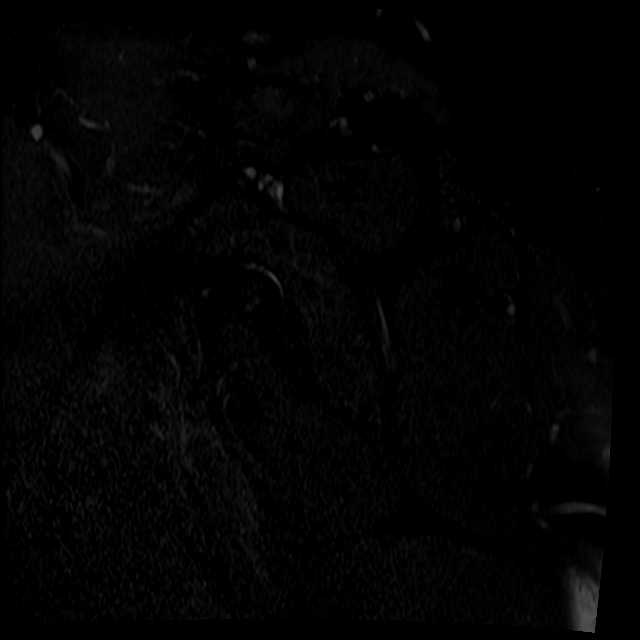
[im 11/22]
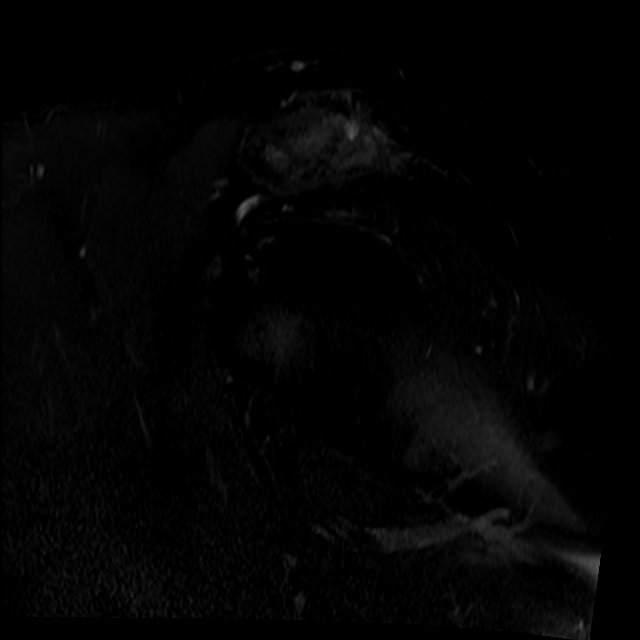
[im 16/22]
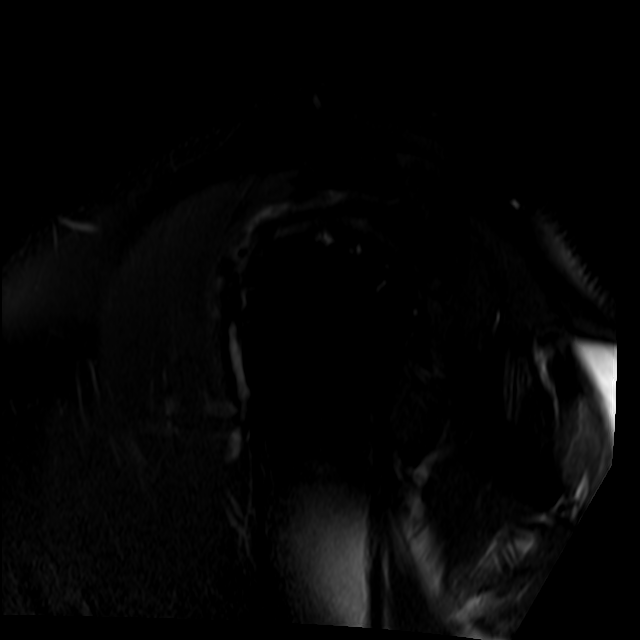
[im 22/22]
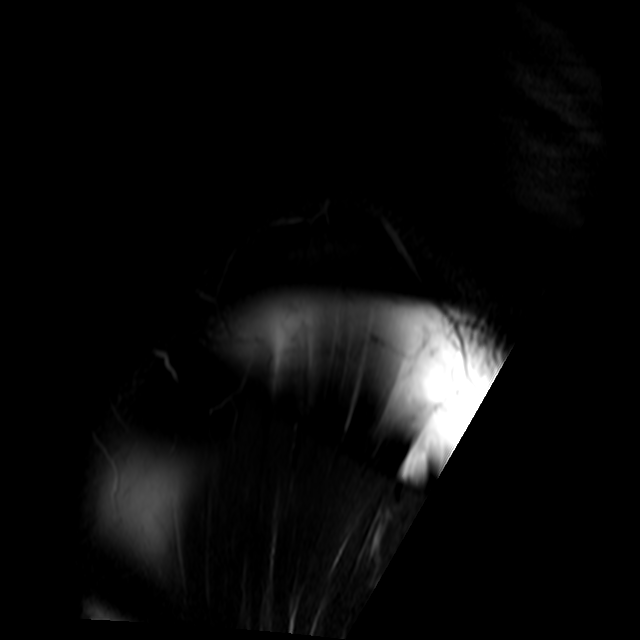

[Series 17: T1 · oblique · left · 4.0mm · 0.44mm/px · 5 of 22 slices shown]
[im 1/22]
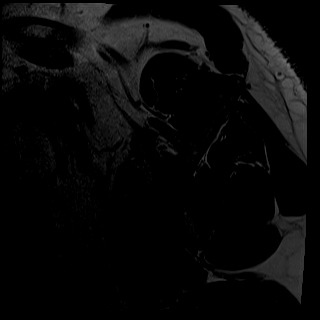
[im 6/22]
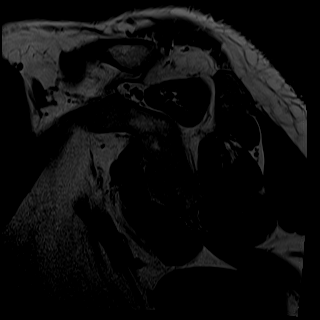
[im 11/22]
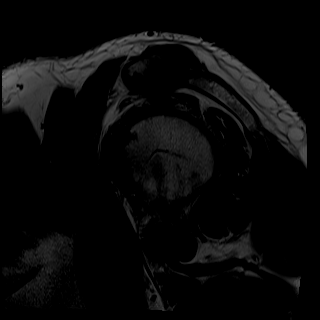
[im 16/22]
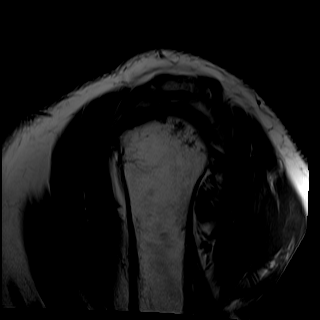
[im 22/22]
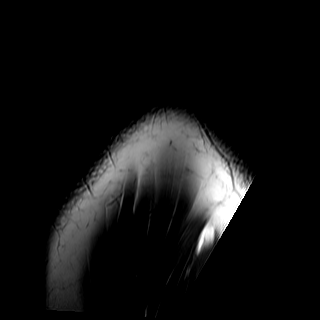

[Series 18: STIR · oblique · left · 4.0mm · 0.35mm/px · 1 of 22 slices shown]
[im 1/22]
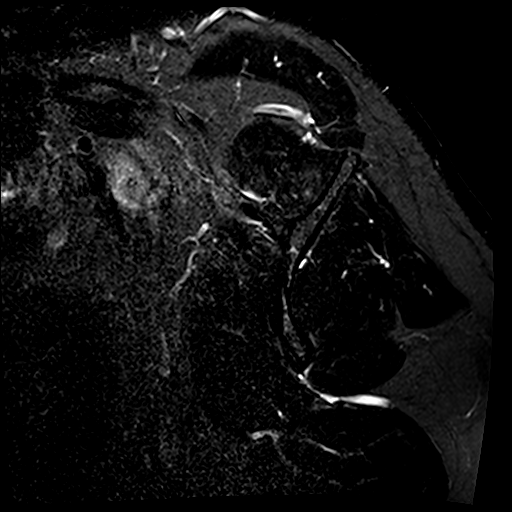

[30 of 40 positions shown; findings below may reference images not displayed]

FINDINGS: Rotator cuff: There is attenuation and increased T2 signal
indicating a partial-thickness articular sided tear of the majority
of the mid and anterior supraspinatus tendon footprint, measuring up
to 7 mm in transverse dimension (coronal series 15 images 12 through
15) and 19 mm in AP dimension (sagittal series 16, image 17). Mild
anterior infraspinatus insertional tendinosis. The subscapularis and
teres minor are intact.

Muscles: Mild proximal superior subscapularis muscle fatty
infiltration (sagittal series 17, image 1).

Biceps long head: The intra-articular long head of the biceps tendon
is intact.

Acromioclavicular Joint: There are moderate to severe degenerative
changes of the acromioclavicular joint including joint space
narrowing, subchondral marrow edema, subchondral cystic change, and
peripheral osteophytosis. There is moderate to high-grade impression
on the underlying anterior supraspinatus musculotendinous junction.
Type II acromion. Mild distal lateral acromion degenerative
spurring. Mild subacromial/subdeltoid bursitis.

Glenohumeral Joint: Mild-to-moderate glenoid and humeral head
cartilage thinning.

Labrum: Grossly intact, but evaluation is limited by lack of
intraarticular fluid.

Bones:  No acute fracture.

Other: None.
IMPRESSION: :
IMPRESSION: 1. Partial-thickness articular sided tear of the mid and anterior
supraspinatus tendon footprint measuring up to 1.9 cm in AP
dimension and involving up to approximately 50% of the transverse
tendon dimension.
2. Mild anterior infraspinatus insertional tendinosis.
3. Moderate to severe degenerative changes of the acromioclavicular
joint including subchondral marrow edema suggesting this region
likely represents a source of pain. There is moderate to high-grade
impression on the underlying anterior supraspinatus musculotendinous
junction.
4. Mild subacromial/subdeltoid bursitis.

## 2023-01-22 ENCOUNTER — Inpatient Hospital Stay (HOSPITAL_BASED_OUTPATIENT_CLINIC_OR_DEPARTMENT_OTHER): Payer: 59 | Admitting: Oncology

## 2023-01-22 ENCOUNTER — Encounter: Payer: Self-pay | Admitting: Oncology

## 2023-01-22 ENCOUNTER — Inpatient Hospital Stay: Payer: 59 | Attending: Oncology

## 2023-01-22 VITALS — BP 126/71 | HR 80 | Temp 97.3°F | Resp 18 | Wt 289.7 lb

## 2023-01-22 DIAGNOSIS — Z7901 Long term (current) use of anticoagulants: Secondary | ICD-10-CM | POA: Diagnosis not present

## 2023-01-22 DIAGNOSIS — Z79899 Other long term (current) drug therapy: Secondary | ICD-10-CM | POA: Insufficient documentation

## 2023-01-22 DIAGNOSIS — D751 Secondary polycythemia: Secondary | ICD-10-CM

## 2023-01-22 DIAGNOSIS — Z86718 Personal history of other venous thrombosis and embolism: Secondary | ICD-10-CM

## 2023-01-22 DIAGNOSIS — E876 Hypokalemia: Secondary | ICD-10-CM | POA: Insufficient documentation

## 2023-01-22 DIAGNOSIS — G4730 Sleep apnea, unspecified: Secondary | ICD-10-CM | POA: Diagnosis not present

## 2023-01-22 DIAGNOSIS — K635 Polyp of colon: Secondary | ICD-10-CM | POA: Diagnosis not present

## 2023-01-22 DIAGNOSIS — Z87891 Personal history of nicotine dependence: Secondary | ICD-10-CM | POA: Insufficient documentation

## 2023-01-22 LAB — CMP (CANCER CENTER ONLY)
ALT: 16 U/L (ref 0–44)
AST: 21 U/L (ref 15–41)
Albumin: 4.2 g/dL (ref 3.5–5.0)
Alkaline Phosphatase: 51 U/L (ref 38–126)
Anion gap: 11 (ref 5–15)
BUN: 11 mg/dL (ref 8–23)
CO2: 23 mmol/L (ref 22–32)
Calcium: 9.1 mg/dL (ref 8.9–10.3)
Chloride: 102 mmol/L (ref 98–111)
Creatinine: 1.22 mg/dL (ref 0.61–1.24)
GFR, Estimated: >60 mL/min (ref >60)
Glucose, Bld: 125 mg/dL — ABNORMAL HIGH (ref 70–99)
Potassium: 3.2 mmol/L — ABNORMAL LOW (ref 3.5–5.1)
Sodium: 136 mmol/L (ref 135–145)
Total Bilirubin: 1.2 mg/dL — ABNORMAL HIGH (ref <1.2)
Total Protein: 7.2 g/dL (ref 6.5–8.1)

## 2023-01-22 LAB — CBC WITH DIFFERENTIAL (CANCER CENTER ONLY)
Abs Immature Granulocytes: 0.02 10*3/uL (ref 0.00–0.07)
Basophils Absolute: 0.1 10*3/uL (ref 0.0–0.1)
Basophils Relative: 1 %
Eosinophils Absolute: 0.1 10*3/uL (ref 0.0–0.5)
Eosinophils Relative: 2 %
HCT: 51.8 % (ref 39.0–52.0)
Hemoglobin: 17.6 g/dL — ABNORMAL HIGH (ref 13.0–17.0)
Immature Granulocytes: 0 %
Lymphocytes Relative: 34 %
Lymphs Abs: 2.5 10*3/uL (ref 0.7–4.0)
MCH: 29.2 pg (ref 26.0–34.0)
MCHC: 34 g/dL (ref 30.0–36.0)
MCV: 86 fL (ref 80.0–100.0)
Monocytes Absolute: 0.3 10*3/uL (ref 0.1–1.0)
Monocytes Relative: 4 %
Neutro Abs: 4.3 10*3/uL (ref 1.7–7.7)
Neutrophils Relative %: 59 %
Platelet Count: 148 10*3/uL — ABNORMAL LOW (ref 150–400)
RBC: 6.02 MIL/uL — ABNORMAL HIGH (ref 4.22–5.81)
RDW: 13.8 % (ref 11.5–15.5)
WBC Count: 7.3 10*3/uL (ref 4.0–10.5)
nRBC: 0 % (ref 0.0–0.2)

## 2023-01-22 MED ORDER — POTASSIUM CHLORIDE CRYS ER 20 MEQ PO TBCR: 20.0000 meq | EXTENDED_RELEASE_TABLET | Freq: Every day | ORAL | 0 refills | Status: DC

## 2023-01-22 MED ORDER — APIXABAN 2.5 MG PO TABS: 2.5000 mg | ORAL_TABLET | Freq: Two times a day (BID) | ORAL | 1 refills | Status: DC

## 2023-01-22 NOTE — Assessment & Plan Note (Signed)
He has multiple colon polyps.  Recommend genetic testing.he declined.

## 2023-01-22 NOTE — Progress Notes (Signed)
Hematology/Oncology Progress note [phone] [phone]      Patient Care Team: Zachary Fleeting, MD as PCP - General (Family Medicine) End, Zachary Deer, MD as PCP - Cardiology (Cardiology) Zachary Patience, MD as Consulting Physician (Oncology)   ASSESSMENT & PLAN:   History of DVT of lower extremity #History of recurrent DVT. Continue Eliquis 2.5 mg twice daily for maintenance.  He tolerates well.  Prescription refilled. Chronic lower extremity edema secondary to venous insufficiency.   Continue compression stocking.  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  Encourage patient to use CPAP machine Hemoglobin is stable.  No need for phlebotomy at this point.  Polyp of colon He has multiple colon polyps.  Recommend genetic testing.he declined.   Hypokalemia Recommend potassium daily x 3 days.   Orders Placed This Encounter  Procedures   CBC with Differential (Cancer Center Only)    Standing Status:   Future    Standing Expiration Date:   01/22/2024   CMP (Cancer Center only)    Standing Status:   Future    Standing Expiration Date:   01/22/2024   Follow up in 6 months.   All questions were answered. The patient knows to call the clinic with any problems, questions or concerns.  Zachary Patience, MD, PhD [facility] Hematology Oncology 01/22/2023   CHIEF COMPLAINTS/REASON FOR VISIT:  Follow-up for DVT  HISTORY OF PRESENTING ILLNESS:  Zachary Sweeney is a  77 y.o.  male presents for DVT, chronic anticoagulation.  Patient recently established care with gastroenterology for consultation and management of chronic history of diffuse abdominal pain as well as epigastric pain.  Not related to meals, exacerbated when he bends down.  Not associated with nausea or vomiting. Denies any weight loss, fever, chills, night sweats.  Appetite is fair. Denies hematochezia, hematuria, hematemesis, epistaxis, black tarry stool or easy bruising.   Endoscopy procedures were recommended for further evaluation.  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  Patient was referred by gastroenterology to me for further evaluation and peri-procedure anticoagulation recommendation. Patient also was found to have an abdominal wall hernia which caused this pain with bending down.  He was also referred to see surgery Dr. Aleen Sweeney for further evaluation.  06/25/2017, patient had US venous bilateral lower extremity which showed a focal area of partial compressibility popliteal Vein on the right, compatible with a partially occlusive DVT.  The borders are smooth and this has a subacute appearance. Left lower extremity no DVT.   Patient recalls that he had right foot pain and swelling, as the initial presentation which prompted ultrasound evaluation. Patient was started on Eliquis after ED discussed his case with vascular surgery Dr. Wyn Quaker. Patient was taken off Eliquis in November 2019. Presented to [facility] emergency room on 03/30/2018 complaining right lower extremity pain and swelling again which is similar to when he had a blood clot.  Patient was concerned that he could have developed blood clots again so he restarted taking Eliquis for the past week prior to presenting to the emergency room.  He mentions to a friend at the church and they urged him to go to emergency room. In the ED, ultrasound showed nonocclusive right lower extremity DVT. 04/10/2018, he had CT abdomen pelvis done for evaluation of chronic abdominal pain.  CT showed no acute findings in the abdomen and pelvis.  Nonobstructive punctate stones over the lower pole collecting system of the right kidney.  Small umbilical hernia containing only peritoneal  fat.  Aortic atherosclerosis.  Patient reports feeling well today.  Right lower extremity pain and swelling has improved.  He is taking Eliquis 5 mg twice daily since February 2020.  Tolerates well.  No bleeding  events.  #  GI work-up done on 07/24/2018. Colonoscopy showed colon polyps which were resected and retrieved.  Diverticulosis.  Biopsy showed tubular adenoma.  Recommend repeat colonoscopy in 3 years negative for high-grade dysplasia and malignancy. EGD showed salmon-colored mucosa suggestive of Barrett's esophagus.  Biopsied.  Per GI note, biopsy showed evidence of acid reflux.  Patient was started on Pepcid.  #  07/10/2018 US venous lower extremity unilateral right findings compatible with resolving isolated right popliteal DVT. Recommend patient to be switched to Eliquis 2.5 mg twice daily for long-term anticoagulation maintenance.  # sleep Apnea  05/04/2022, patient had a colonoscopy, patient was found to have multiple colon polyps, pathology were negative for high-grade dysplastic malignancy.  Hyper plastic polyp, tubular adenoma and sessile serrated polyps. Patient denies any family history of colon cancer.  Maternal uncle was diagnosed with prostate cancer.  INTERVAL HISTORY Zachary Sweeney is a 77 y.o. male who has above history reviewed by me today presents for follow up visit for DVT and erythrocytosis.  Patient was last seen 1 year ago and presents to establish care. Problems and complaints are listed below: He takes eliquis 2.5mg  BID.  Patient denies any bleeding events Continues to have intermittent right lower extremity swelling which is chronic. Sleep apnea, he uses CPAP, use on some days.     Review of Systems  Constitutional:  Negative for appetite change, chills, fatigue, fever and unexpected weight change.  HENT:   Negative for hearing loss and voice change.   Eyes:  Negative for eye problems and icterus.  Respiratory:  Negative for chest tightness, cough and shortness of breath.   Cardiovascular:  Positive for leg swelling. Negative for chest pain.  Gastrointestinal:  Negative for abdominal distention, abdominal pain, nausea and vomiting.  Endocrine: Negative for hot  flashes.  Genitourinary:  Negative for difficulty urinating, dysuria and frequency.   Musculoskeletal:  Negative for arthralgias.  Skin:  Negative for itching and rash.  Neurological:  Negative for light-headedness and numbness.  Hematological:  Negative for adenopathy. Does not bruise/bleed easily.  Psychiatric/Behavioral:  Negative for confusion.     MEDICAL HISTORY:  Past Medical History:  Diagnosis Date   Acid reflux    Arthritis    Back injury    Colon polyp    DVT (deep venous thrombosis) (HCC)    a. 06/2017 LE U/S: Right popliteal vein DVT.   Dyspnea on exertion    a. 06/2017 CTA Chest: No PE. No significant coronary Ca2+, centrilobular and paraseptal emphysema;  b.  07/2017 Echo: EF 60-65%, no rwma, Gr1 DD, mildly dil LA. Nl RV fxn; c. 07/2017 MV: Small, severe defect involving apical inf and apical segments - only on rest images consistent w/ artifact. No ischemia or scar.   Emphysema of lung (HCC)    a. 06/2017 CTA Chest: Centrilobular and paraseptal emphysema, with mild geographic ground-glass opacity likely representing small airway dzs.   Erythrocytosis 04/17/2019   Hypertension    Sleep apnea    Torn ligament     SURGICAL HISTORY: Past Surgical History:  Procedure Laterality Date   COLONOSCOPY  2008   COLONOSCOPY N/A 05/04/2022   Procedure: COLONOSCOPY;  Surgeon: Jaynie Collins, DO;  Location: [facility] ENDOSCOPY;  Service: Gastroenterology;  Laterality: N/A;  COLONOSCOPY WITH PROPOFOL N/A 02/27/2017   Procedure: COLONOSCOPY WITH PROPOFOL;  Surgeon: Earline Mayotte, MD;  Location: [facility] ENDOSCOPY;  Service: Endoscopy;  Laterality: N/A;   COLONOSCOPY WITH PROPOFOL N/A 07/24/2018   Procedure: COLONOSCOPY WITH PROPOFOL;  Surgeon: Pasty Spillers, MD;  Location: [facility] ENDOSCOPY;  Service: Endoscopy;  Laterality: N/A;   ESOPHAGOGASTRODUODENOSCOPY (EGD) WITH PROPOFOL N/A 02/27/2017   Procedure: ESOPHAGOGASTRODUODENOSCOPY (EGD) WITH PROPOFOL;  Surgeon: Earline Mayotte,  MD;  Location: [facility] ENDOSCOPY;  Service: Endoscopy;  Laterality: N/A;   ESOPHAGOGASTRODUODENOSCOPY (EGD) WITH PROPOFOL N/A 07/24/2018   Procedure: ESOPHAGOGASTRODUODENOSCOPY (EGD) WITH PROPOFOL;  Surgeon: Pasty Spillers, MD;  Location: [facility] ENDOSCOPY;  Service: Endoscopy;  Laterality: N/A;   HERNIA REPAIR Right    inguinal   INSERTION OF MESH  02/01/2021   Procedure: INSERTION OF MESH;  Surgeon: Carolan Shiver, MD;  Location: [facility] ORS;  Service: General;;   KNEE SURGERY Left    NECK SURGERY     ROTATOR CUFF REPAIR Right    SHOULDER ARTHROSCOPY WITH SUBACROMIAL DECOMPRESSION, ROTATOR CUFF REPAIR AND BICEP TENDON REPAIR Left 07/25/2021   Procedure: LEFT SHOULDER ARTHROSCOPY WITH DEBRIDEMENT, DECOMPRESSION, DISTAL CLAVICLE EXCISION, BICEPS TENODESIS;  Surgeon: Christena Flake, MD;  Location: [facility] ORS;  Service: Orthopedics;  Laterality: Left;   UMBILICAL HERNIA REPAIR N/A 04/22/2019   Procedure: HERNIA REPAIR UMBILICAL ADULT;  Surgeon: Campbell Lerner, MD;  Location: [facility] ORS;  Service: General;  Laterality: N/A;    SOCIAL HISTORY: Social History   Socioeconomic History   Marital status: Single    Spouse name: Not on file   Number of children: Not on file   Years of education: Not on file   Highest education level: Not on file  Occupational History   Occupation: retired  Tobacco Use   Smoking status: Former    Current packs/day: 0.00    Average packs/day: 0.3 packs/day for 30.0 years (7.5 ttl pk-yrs)    Types: Cigarettes    Start date: 02/20/1964    Quit date: 02/19/1994    Years since quitting: 28.9   Smokeless tobacco: Never  Vaping Use   Vaping status: Never Used  Substance and Sexual Activity   Alcohol use: No   Drug use: No   Sexual activity: Not on file  Other Topics Concern   Not on file  Social History Narrative   Lives alone   Social Determinants of Health   Financial Resource Strain: Not on file  Food Insecurity: Not on file  Transportation Needs: Not on  file  Physical Activity: Not on file  Stress: Not on file  Social Connections: Not on file  Intimate Partner Violence: Not on file    FAMILY HISTORY: Family History  Problem Relation Age of Onset   Lung cancer Mother    Heart disease Father    Prostate cancer Maternal Uncle     ALLERGIES:  has No Known Allergies.  MEDICATIONS:  Current Outpatient Medications  Medication Sig Dispense Refill   acetaminophen (TYLENOL) 500 MG tablet Take 1,000 mg by mouth every 6 (six) hours as needed for fever.     albuterol (VENTOLIN HFA) 108 (90 Base) MCG/ACT inhaler 2 puffs every 4 (four) hours as needed for shortness of breath or wheezing.     clobetasol cream (TEMOVATE) 0.05 %      diltiazem (CARDIZEM CD) 300 MG 24 hr capsule Take 1 capsule (300 mg total) by mouth daily. 90 capsule 3   fluocinonide (LIDEX) 0.05 % external solution Apply 1 application  topically daily.     ketoconazole (NIZORAL) 2 % cream Apply topically daily.     losartan (COZAAR) 50 MG tablet Take 50 mg by mouth daily.     omeprazole (PRILOSEC) 20 MG capsule      potassium chloride SA (KLOR-CON M) 20 MEQ tablet Take 1 tablet (20 mEq total) by mouth daily. 3 tablet 0   umeclidinium-vilanterol (ANORO ELLIPTA) 62.5-25 MCG/INH AEPB Inhale 1 puff into the lungs daily.     VIAGRA 50 MG tablet SMARTSIG:1 Tablet(s) By Mouth     apixaban (ELIQUIS) 2.5 MG TABS tablet Take 1 tablet (2.5 mg total) by mouth 2 (two) times daily. 180 tablet 1   metoprolol succinate (TOPROL-XL) 50 MG 24 hr tablet  (Patient not taking: Reported on 01/22/2023)     omeprazole (PRILOSEC) 20 MG capsule Take 1 capsule (20 mg total) by mouth daily.     oxyCODONE (ROXICODONE) 5 MG immediate release tablet Take 1-2 tablets (5-10 mg total) by mouth every 4 (four) hours as needed for severe pain or moderate pain. (Patient not taking: Reported on 12/14/2021) 40 tablet 0   simethicone (MYLICON) 80 MG chewable tablet Chew 1 tablet (80 mg total) by mouth 4 (four) times daily  as needed (abdominal bloating or gas pains). (Patient not taking: Reported on 12/14/2021) 30 tablet 0   No current facility-administered medications for this visit.     PHYSICAL EXAMINATION:  Vitals:   01/22/23 1409  BP: 126/71  Pulse: 80  Resp: 18  Temp: (!) 97.3 F (36.3 C)   Filed Weights   01/22/23 1409  Weight: 289 lb 11.2 oz (131.4 kg)    Physical Exam Constitutional:      General: He is not in acute distress.    Appearance: He is obese.  HENT:     Head: Normocephalic and atraumatic.  Eyes:     General: No scleral icterus.    Pupils: Pupils are equal, round, and reactive to light.  Cardiovascular:     Rate and Rhythm: Normal rate and regular rhythm.     Heart sounds: Normal heart sounds.  Pulmonary:     Effort: Pulmonary effort is normal. No respiratory distress.     Breath sounds: No wheezing.  Abdominal:     General: Bowel sounds are normal. There is no distension.     Palpations: Abdomen is soft. There is no mass.     Tenderness: There is no abdominal tenderness.  Musculoskeletal:        General: No deformity. Normal range of motion.     Cervical back: Normal range of motion and neck supple.     Comments: Trace bilateral lower extremity edema.   Skin:    General: Skin is warm and dry.     Findings: No erythema or rash.  Neurological:     Mental Status: He is alert and oriented to person, place, and time. Mental status is at baseline.     Cranial Nerves: No cranial nerve deficit.     Coordination: Coordination normal.  Psychiatric:        Mood and Affect: Mood normal.        Behavior: Behavior normal.        Thought Content: Thought content normal.     RADIOGRAPHIC STUDIES: I have personally reviewed the radiological images as listed and agreed with the findings in the report. No results found.     LABORATORY DATA:  I have reviewed the data as listed    Latest Ref  Rng & Units 01/22/2023    1:51 PM 07/18/2022    1:22 PM 12/14/2021   10:30 AM   CBC  WBC 4.0 - 10.5 K/uL 7.3  7.1  7.6   Hemoglobin 13.0 - 17.0 g/dL 29.5  28.4  13.2   Hematocrit 39.0 - 52.0 % 51.8  47.0  50.5   Platelets 150 - 400 K/uL 148  158  159       Latest Ref Rng & Units 01/22/2023    1:52 PM 07/18/2022    1:22 PM 12/14/2021   10:30 AM  CMP  Glucose 70 - 99 mg/dL 440  102  725   BUN 8 - 23 mg/dL 11  11  11    Creatinine 0.61 - 1.24 mg/dL 3.66  4.40  3.47   Sodium 135 - 145 mmol/L 136  136  138   Potassium 3.5 - 5.1 mmol/L 3.2  3.3  3.9   Chloride 98 - 111 mmol/L 102  104  106   CO2 22 - 32 mmol/L 23  24  25    Calcium 8.9 - 10.3 mg/dL 9.1  8.8  9.3   Total Protein 6.5 - 8.1 g/dL 7.2  7.4  7.7   Total Bilirubin <1.2 mg/dL 1.2  0.8  0.7   Alkaline Phos 38 - 126 U/L 51  50  52   AST 15 - 41 U/L 21  22  24    ALT 0 - 44 U/L 16  15  19

## 2023-01-22 NOTE — Assessment & Plan Note (Signed)
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  Encourage patient to use CPAP machine Hemoglobin is stable.  No need for phlebotomy at this point.

## 2023-01-22 NOTE — Assessment & Plan Note (Signed)
Recommend potassium 67meq daily x 3 days.

## 2023-01-22 NOTE — Assessment & Plan Note (Addendum)
#  History of recurrent DVT. Continue Eliquis 2.5 mg twice daily for maintenance.  He tolerates well.  Prescription refilled. Chronic lower extremity edema secondary to venous insufficiency.  Continue compression stocking. 

## 2023-01-22 NOTE — Progress Notes (Signed)
Pt here for follow up. No new concerns voiced.   

## 2023-04-01 ENCOUNTER — Other Ambulatory Visit: Payer: Self-pay | Admitting: Oncology

## 2023-04-03 ENCOUNTER — Encounter: Payer: Self-pay | Admitting: Oncology

## 2023-06-23 ENCOUNTER — Encounter: Payer: Self-pay | Admitting: Oncology

## 2023-07-23 ENCOUNTER — Encounter: Payer: Self-pay | Admitting: Oncology

## 2023-07-23 ENCOUNTER — Inpatient Hospital Stay (HOSPITAL_BASED_OUTPATIENT_CLINIC_OR_DEPARTMENT_OTHER): Payer: Medicaid Other | Admitting: Oncology

## 2023-07-23 ENCOUNTER — Inpatient Hospital Stay: Payer: Medicaid Other | Attending: Oncology

## 2023-07-23 VITALS — BP 138/80 | HR 72 | Temp 96.5°F | Resp 18 | Wt 291.1 lb

## 2023-07-23 DIAGNOSIS — Z79899 Other long term (current) drug therapy: Secondary | ICD-10-CM | POA: Diagnosis not present

## 2023-07-23 DIAGNOSIS — Z86718 Personal history of other venous thrombosis and embolism: Secondary | ICD-10-CM

## 2023-07-23 DIAGNOSIS — J439 Emphysema, unspecified: Secondary | ICD-10-CM | POA: Diagnosis not present

## 2023-07-23 DIAGNOSIS — Z87891 Personal history of nicotine dependence: Secondary | ICD-10-CM | POA: Diagnosis not present

## 2023-07-23 DIAGNOSIS — K635 Polyp of colon: Secondary | ICD-10-CM

## 2023-07-23 DIAGNOSIS — D751 Secondary polycythemia: Secondary | ICD-10-CM | POA: Insufficient documentation

## 2023-07-23 DIAGNOSIS — Z801 Family history of malignant neoplasm of trachea, bronchus and lung: Secondary | ICD-10-CM | POA: Insufficient documentation

## 2023-07-23 LAB — CBC WITH DIFFERENTIAL (CANCER CENTER ONLY)
Abs Immature Granulocytes: 0.03 10*3/uL (ref 0.00–0.07)
Basophils Absolute: 0 10*3/uL (ref 0.0–0.1)
Basophils Relative: 0 %
Eosinophils Absolute: 0.1 10*3/uL (ref 0.0–0.5)
Eosinophils Relative: 2 %
HCT: 54.2 % — ABNORMAL HIGH (ref 39.0–52.0)
Hemoglobin: 18.1 g/dL — ABNORMAL HIGH (ref 13.0–17.0)
Immature Granulocytes: 0 %
Lymphocytes Relative: 32 %
Lymphs Abs: 2.5 10*3/uL (ref 0.7–4.0)
MCH: 29.6 pg (ref 26.0–34.0)
MCHC: 33.4 g/dL (ref 30.0–36.0)
MCV: 88.6 fL (ref 80.0–100.0)
Monocytes Absolute: 0.6 10*3/uL (ref 0.1–1.0)
Monocytes Relative: 8 %
Neutro Abs: 4.5 10*3/uL (ref 1.7–7.7)
Neutrophils Relative %: 58 %
Platelet Count: 149 10*3/uL — ABNORMAL LOW (ref 150–400)
RBC: 6.12 MIL/uL — ABNORMAL HIGH (ref 4.22–5.81)
RDW: 13.9 % (ref 11.5–15.5)
WBC Count: 7.8 10*3/uL (ref 4.0–10.5)
nRBC: 0 % (ref 0.0–0.2)

## 2023-07-23 LAB — CMP (CANCER CENTER ONLY)
ALT: 16 U/L (ref 0–44)
AST: 22 U/L (ref 15–41)
Albumin: 4.4 g/dL (ref 3.5–5.0)
Alkaline Phosphatase: 52 U/L (ref 38–126)
Anion gap: 7 (ref 5–15)
BUN: 12 mg/dL (ref 8–23)
CO2: 26 mmol/L (ref 22–32)
Calcium: 9.2 mg/dL (ref 8.9–10.3)
Chloride: 104 mmol/L (ref 98–111)
Creatinine: 1.13 mg/dL (ref 0.61–1.24)
GFR, Estimated: >60 mL/min (ref >60)
Glucose, Bld: 89 mg/dL (ref 70–99)
Potassium: 4.1 mmol/L (ref 3.5–5.1)
Sodium: 137 mmol/L (ref 135–145)
Total Bilirubin: 1.2 mg/dL (ref 0.0–1.2)
Total Protein: 7.8 g/dL (ref 6.5–8.1)

## 2023-07-23 MED ORDER — APIXABAN 2.5 MG PO TABS: 2.5000 mg | ORAL_TABLET | Freq: Two times a day (BID) | ORAL | 1 refills | Status: AC

## 2023-07-23 NOTE — Assessment & Plan Note (Signed)
 He has multiple colon polyps.  he declined genetic testing.

## 2023-07-23 NOTE — Assessment & Plan Note (Signed)
#  History of recurrent DVT. Continue Eliquis 2.5 mg twice daily for maintenance.  He tolerates well.  Prescription refilled. Chronic lower extremity edema secondary to venous insufficiency.  Continue compression stocking. 

## 2023-07-23 NOTE — Assessment & Plan Note (Signed)
 Recommend pt to follow up with Dr. Arno Lapidus

## 2023-07-23 NOTE — Progress Notes (Signed)
 Hematology/Oncology Progress note [phone] [phone]      Patient Care Team: Kenton Pearl, MD as PCP - General (Family Medicine) End, Veryl Gottron, MD as PCP - Cardiology (Cardiology) Timmy Forbes, MD as Consulting Physician (Oncology)   ASSESSMENT & PLAN:   History of DVT of lower extremity #History of recurrent DVT. Continue Eliquis  2.5 mg twice daily for maintenance.  He tolerates well.  Prescription refilled. Chronic lower extremity edema secondary to venous insufficiency.   Continue compression stocking.  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  He reports increased compliance with CPAP Hemoglobin is  trending up.  No need for phlebotomy at this point.  I will check erythropoietin, carbo monoxide level, JAK2 with reflex to other mutations, BCR-ABL1 FISH.     Polyp of colon He has multiple colon polyps.  he declined genetic testing.  Emphysema of lung (HCC) Recommend pt to follow up with Dr. Arno Lapidus  Orders Placed This Encounter  Procedures   BCR-ABL1 FISH    Standing Status:   Future    Expected Date:   07/23/2023    Expiration Date:   07/22/2024   Carbon monoxide, blood (performed at ref lab)    Standing Status:   Future    Expected Date:   07/23/2023    Expiration Date:   07/22/2024   Erythropoietin    Standing Status:   Future    Expected Date:   07/23/2023    Expiration Date:   07/22/2024   JAK2 V617F rfx CALR/MPL/E12-15    Standing Status:   Future    Expected Date:   07/23/2023    Expiration Date:   07/22/2024   CBC with Differential (Cancer Center Only)    Standing Status:   Future    Expected Date:   10/23/2023    Expiration Date:   07/22/2024   Follow up in 3 months.   All questions were answered. The patient knows to call the clinic with any problems, questions or concerns.  Timmy Forbes, MD, PhD [facility] Hematology Oncology 07/23/2023   CHIEF COMPLAINTS/REASON FOR VISIT:  Follow-up for DVT  HISTORY OF  PRESENTING ILLNESS:  Zachary Sweeney is a  78 y.o.  male presents for DVT, chronic anticoagulation.  Patient recently established care with gastroenterology for consultation and management of chronic history of diffuse abdominal pain as well as epigastric pain.  Not related to meals, exacerbated when he bends down.  Not associated with nausea or vomiting. Denies any weight loss, fever, chills, night sweats.  Appetite is fair. Denies hematochezia, hematuria, hematemesis, epistaxis, black tarry stool or easy bruising.  Endoscopy procedures were recommended for further evaluation.  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  Patient was referred by gastroenterology to me for further evaluation and peri-procedure anticoagulation recommendation. Patient also was found to have an abdominal wall hernia which caused this pain with bending down.  He was also referred to see surgery Dr. Mauri Sous for further evaluation.  06/25/2017, patient had US  venous bilateral lower extremity which showed a focal area of partial compressibility popliteal Vein on the right, compatible with a partially occlusive DVT.  The borders are smooth and this has a subacute appearance. Left lower extremity no DVT.   Patient recalls that he had right foot pain and swelling, as the initial presentation which prompted ultrasound evaluation. Patient was started on Eliquis  after ED discussed his case with vascular surgery Dr. Vonna Guardian. Patient was taken off Eliquis  in November 2019. Presented  to [facility] emergency room on 03/30/2018 complaining right lower extremity pain and swelling again which is similar to when he had a blood clot.  Patient was concerned that he could have developed blood clots again so he restarted taking Eliquis  for the past week prior to presenting to the emergency room.  He mentions to a friend at the church and they urged him to go to emergency room. In the ED, ultrasound showed nonocclusive  right lower extremity DVT. 04/10/2018, he had CT abdomen pelvis done for evaluation of chronic abdominal pain.  CT showed no acute findings in the abdomen and pelvis.  Nonobstructive punctate stones over the lower pole collecting system of the right kidney.  Small umbilical hernia containing only peritoneal fat.  Aortic atherosclerosis.  Patient reports feeling well today.  Right lower extremity pain and swelling has improved.  He is taking Eliquis  5 mg twice daily since February 2020.  Tolerates well.  No bleeding events.  #  GI work-up done on 07/24/2018. Colonoscopy showed colon polyps which were resected and retrieved.  Diverticulosis.  Biopsy showed tubular adenoma.  Recommend repeat colonoscopy in 3 years negative for high-grade dysplasia and malignancy. EGD showed salmon-colored mucosa suggestive of Barrett's esophagus.  Biopsied.  Per GI note, biopsy showed evidence of acid reflux.  Patient was started on Pepcid .  #  07/10/2018 US  venous lower extremity unilateral right findings compatible with resolving isolated right popliteal DVT. Recommend patient to be switched to Eliquis  2.5 mg twice daily for long-term anticoagulation maintenance.  # sleep Apnea  05/04/2022, patient had a colonoscopy, patient was found to have multiple colon polyps, pathology were negative for high-grade dysplastic malignancy.  Hyper plastic polyp, tubular adenoma and sessile serrated polyps. Patient denies any family history of colon cancer.  Maternal uncle was diagnosed with prostate cancer.  INTERVAL HISTORY Zachary Sweeney is a 78 y.o. male who has above history reviewed by me today presents for follow up visit for DVT and erythrocytosis.  Patient was last seen 1 year ago and presents to establish care. Problems and complaints are listed below: He takes eliquis  2.5mg  BID.  Patient denies any bleeding events Continues to have intermittent right lower extremity swelling which is chronic. Sleep apnea, he uses CPAP,  on most days.     Review of Systems  Constitutional:  Negative for appetite change, chills, fatigue, fever and unexpected weight change.  HENT:   Negative for hearing loss and voice change.   Eyes:  Negative for eye problems and icterus.  Respiratory:  Positive for shortness of breath. Negative for chest tightness and cough.   Cardiovascular:  Positive for leg swelling. Negative for chest pain.  Gastrointestinal:  Negative for abdominal distention, abdominal pain, nausea and vomiting.  Endocrine: Negative for hot flashes.  Genitourinary:  Negative for difficulty urinating, dysuria and frequency.   Musculoskeletal:  Negative for arthralgias.  Skin:  Negative for itching and rash.  Neurological:  Negative for light-headedness and numbness.  Hematological:  Negative for adenopathy. Does not bruise/bleed easily.  Psychiatric/Behavioral:  Negative for confusion.     MEDICAL HISTORY:  Past Medical History:  Diagnosis Date   Acid reflux    Arthritis    Back injury    Colon polyp    DVT (deep venous thrombosis) (HCC)    a. 06/2017 LE U/S: Right popliteal vein DVT.   Dyspnea on exertion    a. 06/2017 CTA Chest: No PE. No significant coronary Ca2+, centrilobular and paraseptal emphysema;  b.  07/2017  Echo: EF 60-65%, no rwma, Gr1 DD, mildly dil LA. Nl RV fxn; c. 07/2017 MV: Small, severe defect involving apical inf and apical segments - only on rest images consistent w/ artifact. No ischemia or scar.   Emphysema of lung (HCC)    a. 06/2017 CTA Chest: Centrilobular and paraseptal emphysema, with mild geographic ground-glass opacity likely representing small airway dzs.   Erythrocytosis 04/17/2019   Hypertension    Sleep apnea    Torn ligament     SURGICAL HISTORY: Past Surgical History:  Procedure Laterality Date   COLONOSCOPY  2008   COLONOSCOPY N/A 05/04/2022   Procedure: COLONOSCOPY;  Surgeon: Quintin Buckle, DO;  Location: [facility] ENDOSCOPY;  Service: Gastroenterology;  Laterality:  N/A;   COLONOSCOPY WITH PROPOFOL  N/A 02/27/2017   Procedure: COLONOSCOPY WITH PROPOFOL ;  Surgeon: Marshall Skeeter, MD;  Location: [facility] ENDOSCOPY;  Service: Endoscopy;  Laterality: N/A;   COLONOSCOPY WITH PROPOFOL  N/A 07/24/2018   Procedure: COLONOSCOPY WITH PROPOFOL ;  Surgeon: Irby Mannan, MD;  Location: [facility] ENDOSCOPY;  Service: Endoscopy;  Laterality: N/A;   ESOPHAGOGASTRODUODENOSCOPY (EGD) WITH PROPOFOL  N/A 02/27/2017   Procedure: ESOPHAGOGASTRODUODENOSCOPY (EGD) WITH PROPOFOL ;  Surgeon: Marshall Skeeter, MD;  Location: [facility] ENDOSCOPY;  Service: Endoscopy;  Laterality: N/A;   ESOPHAGOGASTRODUODENOSCOPY (EGD) WITH PROPOFOL  N/A 07/24/2018   Procedure: ESOPHAGOGASTRODUODENOSCOPY (EGD) WITH PROPOFOL ;  Surgeon: Irby Mannan, MD;  Location: [facility] ENDOSCOPY;  Service: Endoscopy;  Laterality: N/A;   HERNIA REPAIR Right    inguinal   INSERTION OF MESH  02/01/2021   Procedure: INSERTION OF MESH;  Surgeon: Eldred Grego, MD;  Location: [facility] ORS;  Service: General;;   KNEE SURGERY Left    NECK SURGERY     ROTATOR CUFF REPAIR Right    SHOULDER ARTHROSCOPY WITH SUBACROMIAL DECOMPRESSION, ROTATOR CUFF REPAIR AND BICEP TENDON REPAIR Left 07/25/2021   Procedure: LEFT SHOULDER ARTHROSCOPY WITH DEBRIDEMENT, DECOMPRESSION, DISTAL CLAVICLE EXCISION, BICEPS TENODESIS;  Surgeon: Elner Hahn, MD;  Location: [facility] ORS;  Service: Orthopedics;  Laterality: Left;   UMBILICAL HERNIA REPAIR N/A 04/22/2019   Procedure: HERNIA REPAIR UMBILICAL ADULT;  Surgeon: Flynn Hylan, MD;  Location: [facility] ORS;  Service: General;  Laterality: N/A;    SOCIAL HISTORY: Social History   Socioeconomic History   Marital status: Single    Spouse name: Not on file   Number of children: Not on file   Years of education: Not on file   Highest education level: Not on file  Occupational History   Occupation: retired  Tobacco Use   Smoking status: Former    Current packs/day: 0.00    Average packs/day: 0.3  packs/day for 30.0 years (7.5 ttl pk-yrs)    Types: Cigarettes    Start date: 02/20/1964    Quit date: 02/19/1994    Years since quitting: 29.4   Smokeless tobacco: Never  Vaping Use   Vaping status: Never Used  Substance and Sexual Activity   Alcohol use: No   Drug use: No   Sexual activity: Not on file  Other Topics Concern   Not on file  Social History Narrative   Lives alone   Social Drivers of Health   Financial Resource Strain: Not on file  Food Insecurity: Not on file  Transportation Needs: Not on file  Physical Activity: Not on file  Stress: Not on file  Social Connections: Not on file  Intimate Partner Violence: Not on file    FAMILY HISTORY: Family History  Problem Relation Age of Onset   Lung cancer  Mother    Heart disease Father    Prostate cancer Maternal Uncle     ALLERGIES:  has no known allergies.  MEDICATIONS:  Current Outpatient Medications  Medication Sig Dispense Refill   acetaminophen  (TYLENOL ) 500 MG tablet Take 1,000 mg by mouth every 6 (six) hours as needed for fever.     albuterol  (VENTOLIN  HFA) 108 (90 Base) MCG/ACT inhaler 2 puffs every 4 (four) hours as needed for shortness of breath or wheezing.     clobetasol cream (TEMOVATE) 0.05 %      diltiazem  (CARDIZEM  CD) 300 MG 24 hr capsule Take 1 capsule (300 mg total) by mouth daily. 90 capsule 3   fluocinonide (LIDEX) 0.05 % external solution Apply 1 application topically daily.     ketoconazole (NIZORAL) 2 % cream Apply topically daily.     losartan (COZAAR) 50 MG tablet Take 50 mg by mouth daily.     omeprazole  (PRILOSEC) 20 MG capsule Take 1 capsule (20 mg total) by mouth daily.     omeprazole  (PRILOSEC) 20 MG capsule      umeclidinium-vilanterol (ANORO ELLIPTA ) 62.5-25 MCG/INH AEPB Inhale 1 puff into the lungs daily.     VIAGRA 50 MG tablet SMARTSIG:1 Tablet(s) By Mouth     apixaban  (ELIQUIS ) 2.5 MG TABS tablet Take 1 tablet (2.5 mg total) by mouth 2 (two) times daily. 200 tablet 1    metoprolol succinate (TOPROL-XL) 50 MG 24 hr tablet  (Patient not taking: Reported on 07/23/2023)     oxyCODONE  (ROXICODONE ) 5 MG immediate release tablet Take 1-2 tablets (5-10 mg total) by mouth every 4 (four) hours as needed for severe pain or moderate pain. (Patient not taking: Reported on 07/23/2023) 40 tablet 0   simethicone  (MYLICON) 80 MG chewable tablet Chew 1 tablet (80 mg total) by mouth 4 (four) times daily as needed (abdominal bloating or gas pains). (Patient not taking: Reported on 07/23/2023) 30 tablet 0   No current facility-administered medications for this visit.     PHYSICAL EXAMINATION:  Vitals:   07/23/23 1412  BP: 138/80  Pulse: 72  Resp: 18  Temp: (!) 96.5 F (35.8 C)  SpO2: 97%   Filed Weights   07/23/23 1412  Weight: 291 lb 1.6 oz (132 kg)    Physical Exam Constitutional:      General: He is not in acute distress.    Appearance: He is obese.  HENT:     Head: Normocephalic and atraumatic.  Eyes:     General: No scleral icterus.    Pupils: Pupils are equal, round, and reactive to light.  Cardiovascular:     Rate and Rhythm: Normal rate and regular rhythm.     Heart sounds: Normal heart sounds.  Pulmonary:     Effort: Pulmonary effort is normal. No respiratory distress.     Breath sounds: No wheezing.  Abdominal:     General: Bowel sounds are normal. There is no distension.     Palpations: Abdomen is soft. There is no mass.     Tenderness: There is no abdominal tenderness.  Musculoskeletal:        General: No deformity. Normal range of motion.     Cervical back: Normal range of motion and neck supple.     Comments: Trace bilateral lower extremity edema.   Skin:    General: Skin is warm and dry.     Findings: No erythema or rash.  Neurological:     Mental Status: He is alert and oriented to  person, place, and time. Mental status is at baseline.     Cranial Nerves: No cranial nerve deficit.     Coordination: Coordination normal.  Psychiatric:         Mood and Affect: Mood normal.        Behavior: Behavior normal.        Thought Content: Thought content normal.     RADIOGRAPHIC STUDIES: I have personally reviewed the radiological images as listed and agreed with the findings in the report. No results found.     LABORATORY DATA:  I have reviewed the data as listed    Latest Ref Rng & Units 07/23/2023    1:55 PM 01/22/2023    1:51 PM 07/18/2022    1:22 PM  CBC  WBC 4.0 - 10.5 K/uL 7.8  7.3  7.1   Hemoglobin 13.0 - 17.0 g/dL 16.1  09.6  04.5   Hematocrit 39.0 - 52.0 % 54.2  51.8  47.0   Platelets 150 - 400 K/uL 149  148  158       Latest Ref Rng & Units 07/23/2023    1:55 PM 01/22/2023    1:52 PM 07/18/2022    1:22 PM  CMP  Glucose 70 - 99 mg/dL 89  409  811   BUN 8 - 23 mg/dL 12  11  11    Creatinine 0.61 - 1.24 mg/dL 9.14  7.82  9.56   Sodium 135 - 145 mmol/L 137  136  136   Potassium 3.5 - 5.1 mmol/L 4.1  3.2  3.3   Chloride 98 - 111 mmol/L 104  102  104   CO2 22 - 32 mmol/L 26  23  24    Calcium 8.9 - 10.3 mg/dL 9.2  9.1  8.8   Total Protein 6.5 - 8.1 g/dL 7.8  7.2  7.4   Total Bilirubin 0.0 - 1.2 mg/dL 1.2  1.2  0.8   Alkaline Phos 38 - 126 U/L 52  51  50   AST 15 - 41 U/L 22  21  22    ALT 0 - 44 U/L 16  16  15

## 2023-07-23 NOTE — Assessment & Plan Note (Signed)
 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  He reports increased compliance with CPAP Hemoglobin is  trending up.  No need for phlebotomy at this point.  I will check erythropoietin, carbo monoxide level, JAK2 with reflex to other mutations, BCR-ABL1 FISH.

## 2023-07-30 ENCOUNTER — Inpatient Hospital Stay

## 2023-07-30 DIAGNOSIS — D751 Secondary polycythemia: Secondary | ICD-10-CM

## 2023-07-31 LAB — ERYTHROPOIETIN: Erythropoietin: 10.1 m[IU]/mL (ref 2.6–18.5)

## 2023-07-31 LAB — CARBON MONOXIDE, BLOOD (PERFORMED AT REF LAB): Carbon Monoxide, Blood: 2.8 % (ref 0.0–3.6)

## 2023-08-06 LAB — JAK2 V617F RFX CALR/MPL/E12-15

## 2023-08-06 LAB — CALR +MPL + E12-E15  (REFLEX)

## 2023-09-11 ENCOUNTER — Ambulatory Visit: Attending: Internal Medicine

## 2023-09-11 DIAGNOSIS — I10 Essential (primary) hypertension: Secondary | ICD-10-CM | POA: Insufficient documentation

## 2023-09-11 DIAGNOSIS — J449 Chronic obstructive pulmonary disease, unspecified: Secondary | ICD-10-CM | POA: Insufficient documentation

## 2023-09-11 DIAGNOSIS — G4733 Obstructive sleep apnea (adult) (pediatric): Secondary | ICD-10-CM | POA: Diagnosis present

## 2023-09-11 DIAGNOSIS — Z6841 Body Mass Index (BMI) 40.0 and over, adult: Secondary | ICD-10-CM | POA: Diagnosis not present

## 2023-09-11 DIAGNOSIS — E669 Obesity, unspecified: Secondary | ICD-10-CM | POA: Insufficient documentation

## 2023-09-11 DIAGNOSIS — R0902 Hypoxemia: Secondary | ICD-10-CM | POA: Insufficient documentation

## 2023-09-11 DIAGNOSIS — R0600 Dyspnea, unspecified: Secondary | ICD-10-CM | POA: Insufficient documentation

## 2023-09-20 ENCOUNTER — Encounter: Payer: Self-pay | Admitting: Internal Medicine

## 2023-09-20 ENCOUNTER — Other Ambulatory Visit: Payer: Self-pay

## 2023-09-20 ENCOUNTER — Emergency Department

## 2023-09-20 ENCOUNTER — Inpatient Hospital Stay
Admission: EM | Admit: 2023-09-20 | Discharge: 2023-09-23 | DRG: 193 | Disposition: A | Discharge status: Home / Self Care | Attending: Internal Medicine | Admitting: Internal Medicine

## 2023-09-20 DIAGNOSIS — Z79899 Other long term (current) drug therapy: Secondary | ICD-10-CM

## 2023-09-20 DIAGNOSIS — J189 Pneumonia, unspecified organism: Principal | ICD-10-CM | POA: Diagnosis present

## 2023-09-20 DIAGNOSIS — R9431 Abnormal electrocardiogram [ECG] [EKG]: Secondary | ICD-10-CM | POA: Diagnosis present

## 2023-09-20 DIAGNOSIS — L219 Seborrheic dermatitis, unspecified: Secondary | ICD-10-CM | POA: Insufficient documentation

## 2023-09-20 DIAGNOSIS — J440 Chronic obstructive pulmonary disease with acute lower respiratory infection: Secondary | ICD-10-CM | POA: Diagnosis present

## 2023-09-20 DIAGNOSIS — J432 Centrilobular emphysema: Secondary | ICD-10-CM | POA: Diagnosis present

## 2023-09-20 DIAGNOSIS — R0602 Shortness of breath: Secondary | ICD-10-CM

## 2023-09-20 DIAGNOSIS — E66812 Obesity, class 2: Secondary | ICD-10-CM | POA: Diagnosis present

## 2023-09-20 DIAGNOSIS — J438 Other emphysema: Secondary | ICD-10-CM | POA: Diagnosis present

## 2023-09-20 DIAGNOSIS — Z6838 Body mass index (BMI) 38.0-38.9, adult: Secondary | ICD-10-CM

## 2023-09-20 DIAGNOSIS — Z8042 Family history of malignant neoplasm of prostate: Secondary | ICD-10-CM

## 2023-09-20 DIAGNOSIS — Z7901 Long term (current) use of anticoagulants: Secondary | ICD-10-CM

## 2023-09-20 DIAGNOSIS — I10 Essential (primary) hypertension: Secondary | ICD-10-CM | POA: Diagnosis present

## 2023-09-20 DIAGNOSIS — R0902 Hypoxemia: Secondary | ICD-10-CM

## 2023-09-20 DIAGNOSIS — Z860100 Personal history of colon polyps, unspecified: Secondary | ICD-10-CM

## 2023-09-20 DIAGNOSIS — Z8249 Family history of ischemic heart disease and other diseases of the circulatory system: Secondary | ICD-10-CM

## 2023-09-20 DIAGNOSIS — Z86718 Personal history of other venous thrombosis and embolism: Secondary | ICD-10-CM

## 2023-09-20 DIAGNOSIS — N1831 Chronic kidney disease, stage 3a: Secondary | ICD-10-CM | POA: Insufficient documentation

## 2023-09-20 DIAGNOSIS — J441 Chronic obstructive pulmonary disease with (acute) exacerbation: Principal | ICD-10-CM | POA: Diagnosis present

## 2023-09-20 DIAGNOSIS — J439 Emphysema, unspecified: Secondary | ICD-10-CM | POA: Diagnosis present

## 2023-09-20 DIAGNOSIS — D751 Secondary polycythemia: Secondary | ICD-10-CM | POA: Diagnosis present

## 2023-09-20 DIAGNOSIS — J9601 Acute respiratory failure with hypoxia: Principal | ICD-10-CM | POA: Diagnosis present

## 2023-09-20 DIAGNOSIS — Z87891 Personal history of nicotine dependence: Secondary | ICD-10-CM

## 2023-09-20 DIAGNOSIS — Z801 Family history of malignant neoplasm of trachea, bronchus and lung: Secondary | ICD-10-CM

## 2023-09-20 DIAGNOSIS — R000 Tachycardia, unspecified: Secondary | ICD-10-CM | POA: Diagnosis present

## 2023-09-20 DIAGNOSIS — G4733 Obstructive sleep apnea (adult) (pediatric): Secondary | ICD-10-CM | POA: Diagnosis present

## 2023-09-20 LAB — BASIC METABOLIC PANEL WITH GFR
Anion gap: 9 (ref 5–15)
BUN: 10 mg/dL (ref 8–23)
CO2: 26 mmol/L (ref 22–32)
Calcium: 9.5 mg/dL (ref 8.9–10.3)
Chloride: 102 mmol/L (ref 98–111)
Creatinine, Ser: 1.23 mg/dL (ref 0.61–1.24)
GFR, Estimated: >60 mL/min (ref >60)
Glucose, Bld: 99 mg/dL (ref 70–99)
Potassium: 3.6 mmol/L (ref 3.5–5.1)
Sodium: 137 mmol/L (ref 135–145)

## 2023-09-20 LAB — CBC
HCT: 52.5 % — ABNORMAL HIGH (ref 39.0–52.0)
Hemoglobin: 17.9 g/dL — ABNORMAL HIGH (ref 13.0–17.0)
MCH: 30 pg (ref 26.0–34.0)
MCHC: 34.1 g/dL (ref 30.0–36.0)
MCV: 88.1 fL (ref 80.0–100.0)
Platelets: 152 K/uL (ref 150–400)
RBC: 5.96 MIL/uL — ABNORMAL HIGH (ref 4.22–5.81)
RDW: 13.9 % (ref 11.5–15.5)
WBC: 15.1 K/uL — ABNORMAL HIGH (ref 4.0–10.5)
nRBC: 0 % (ref 0.0–0.2)

## 2023-09-20 LAB — HEPATIC FUNCTION PANEL
ALT: 21 U/L (ref 0–44)
AST: 27 U/L (ref 15–41)
Albumin: 3.6 g/dL (ref 3.5–5.0)
Alkaline Phosphatase: 48 U/L (ref 38–126)
Bilirubin, Direct: 0.2 mg/dL (ref 0.0–0.2)
Indirect Bilirubin: 0.8 mg/dL (ref 0.3–0.9)
Total Bilirubin: 1 mg/dL (ref 0.0–1.2)
Total Protein: 7.3 g/dL (ref 6.5–8.1)

## 2023-09-20 LAB — URINALYSIS, W/ REFLEX TO CULTURE (INFECTION SUSPECTED)
Bacteria, UA: NONE SEEN
Bilirubin Urine: NEGATIVE
Glucose, UA: >=500 mg/dL — AB
Hgb urine dipstick: NEGATIVE
Ketones, ur: NEGATIVE mg/dL
Leukocytes,Ua: NEGATIVE
Nitrite: NEGATIVE
Protein, ur: NEGATIVE mg/dL
Specific Gravity, Urine: 1.03 (ref 1.005–1.030)
Squamous Epithelial / HPF: 0 /HPF (ref 0–5)
pH: 6 (ref 5.0–8.0)

## 2023-09-20 LAB — LIPASE, BLOOD: Lipase: 29 U/L (ref 11–51)

## 2023-09-20 LAB — TROPONIN I (HIGH SENSITIVITY)
Troponin I (High Sensitivity): 6 ng/L (ref <18)
Troponin I (High Sensitivity): 10 ng/L (ref <18)

## 2023-09-20 LAB — MAGNESIUM: Magnesium: 1.8 mg/dL (ref 1.7–2.4)

## 2023-09-20 LAB — PHOSPHORUS: Phosphorus: 1.1 mg/dL — ABNORMAL LOW (ref 2.5–4.6)

## 2023-09-20 LAB — LACTIC ACID, PLASMA: Lactic Acid, Venous: 1.4 mmol/L (ref 0.5–1.9)

## 2023-09-20 MED ORDER — PROCHLORPERAZINE EDISYLATE 10 MG/2ML IJ SOLN: 10.0000 mg | Freq: Four times a day (QID) | INTRAMUSCULAR | Status: DC | PRN

## 2023-09-20 MED ORDER — ACETAMINOPHEN 650 MG RE SUPP: 650.0000 mg | Freq: Four times a day (QID) | RECTAL | Status: DC | PRN

## 2023-09-20 MED ORDER — ONDANSETRON HCL 4 MG/2ML IJ SOLN
4.0000 mg | Freq: Once | INTRAMUSCULAR | Status: AC
  Administered 2023-09-20: 4 mg via INTRAVENOUS
  Filled 2023-09-20: qty 2

## 2023-09-20 MED ORDER — SODIUM CHLORIDE 0.9 % IV BOLUS
1000.0000 mL | Freq: Once | INTRAVENOUS | Status: AC
  Administered 2023-09-20: 1000 mL via INTRAVENOUS

## 2023-09-20 MED ORDER — SODIUM CHLORIDE 0.9 % IV SOLN
500.0000 mg | INTRAVENOUS | Status: DC
  Administered 2023-09-20 – 2023-09-22 (×3): 500 mg via INTRAVENOUS
  Filled 2023-09-20 (×4): qty 5

## 2023-09-20 MED ORDER — ALBUTEROL SULFATE (2.5 MG/3ML) 0.083% IN NEBU: 2.5000 mg | INHALATION_SOLUTION | RESPIRATORY_TRACT | Status: DC | PRN

## 2023-09-20 MED ORDER — DILTIAZEM HCL ER COATED BEADS 300 MG PO CP24
300.0000 mg | ORAL_CAPSULE | Freq: Every day | ORAL | Status: DC
Start: 2023-09-21 — End: 2023-09-23
  Administered 2023-09-21 – 2023-09-23 (×3): 300 mg via ORAL
  Filled 2023-09-20 (×3): qty 1

## 2023-09-20 MED ORDER — LOSARTAN POTASSIUM 50 MG PO TABS
50.0000 mg | ORAL_TABLET | Freq: Every day | ORAL | Status: DC
  Administered 2023-09-21 – 2023-09-23 (×3): 50 mg via ORAL
  Filled 2023-09-20 (×3): qty 1

## 2023-09-20 MED ORDER — ACETAMINOPHEN 325 MG PO TABS
650.0000 mg | ORAL_TABLET | Freq: Once | ORAL | Status: AC
  Administered 2023-09-20: 650 mg via ORAL
  Filled 2023-09-20: qty 2

## 2023-09-20 MED ORDER — UMECLIDINIUM-VILANTEROL 62.5-25 MCG/ACT IN AEPB
1.0000 | INHALATION_SPRAY | Freq: Every day | RESPIRATORY_TRACT | Status: DC
  Administered 2023-09-20 – 2023-09-23 (×4): 1 via RESPIRATORY_TRACT
  Filled 2023-09-20: qty 14

## 2023-09-20 MED ORDER — LACTATED RINGERS IV SOLN: INTRAVENOUS | Status: AC

## 2023-09-20 MED ORDER — SODIUM CHLORIDE 0.9 % IV SOLN
2.0000 g | Freq: Once | INTRAVENOUS | Status: AC
  Administered 2023-09-20: 2 g via INTRAVENOUS
  Filled 2023-09-20 (×2): qty 12.5

## 2023-09-20 MED ORDER — POTASSIUM PHOSPHATES 15 MMOLE/5ML IV SOLN
30.0000 mmol | Freq: Once | INTRAVENOUS | Status: AC
  Administered 2023-09-20: 30 mmol via INTRAVENOUS
  Filled 2023-09-20: qty 10

## 2023-09-20 MED ORDER — PANTOPRAZOLE SODIUM 40 MG PO TBEC
40.0000 mg | DELAYED_RELEASE_TABLET | Freq: Every day | ORAL | Status: DC
  Administered 2023-09-21 – 2023-09-23 (×3): 40 mg via ORAL
  Filled 2023-09-20 (×3): qty 1

## 2023-09-20 MED ORDER — ACETAMINOPHEN 325 MG PO TABS: 650.0000 mg | ORAL_TABLET | Freq: Four times a day (QID) | ORAL | Status: DC | PRN

## 2023-09-20 MED ORDER — APIXABAN 2.5 MG PO TABS
2.5000 mg | ORAL_TABLET | Freq: Two times a day (BID) | ORAL | Status: DC
  Administered 2023-09-20 – 2023-09-23 (×6): 2.5 mg via ORAL
  Filled 2023-09-20 (×6): qty 1

## 2023-09-20 MED ORDER — POTASSIUM CHLORIDE CRYS ER 20 MEQ PO TBCR
40.0000 meq | EXTENDED_RELEASE_TABLET | Freq: Once | ORAL | Status: AC
  Administered 2023-09-20: 40 meq via ORAL
  Filled 2023-09-20: qty 2

## 2023-09-20 MED ORDER — MAGNESIUM SULFATE 2 GM/50ML IV SOLN
2.0000 g | Freq: Once | INTRAVENOUS | Status: AC
  Administered 2023-09-20: 2 g via INTRAVENOUS
  Filled 2023-09-20: qty 50

## 2023-09-20 MED ORDER — IOHEXOL 350 MG/ML SOLN
100.0000 mL | Freq: Once | INTRAVENOUS | Status: AC | PRN
Start: 2023-09-20 — End: 2023-09-20
  Administered 2023-09-20: 100 mL via INTRAVENOUS

## 2023-09-20 MED ORDER — SODIUM CHLORIDE 0.9 % IV SOLN
2.0000 g | Freq: Three times a day (TID) | INTRAVENOUS | Status: DC
  Administered 2023-09-21 – 2023-09-22 (×4): 2 g via INTRAVENOUS
  Filled 2023-09-20 (×6): qty 12.5

## 2023-09-20 MED ORDER — MORPHINE SULFATE (PF) 4 MG/ML IV SOLN
4.0000 mg | Freq: Once | INTRAVENOUS | Status: AC
  Administered 2023-09-20: 4 mg via INTRAVENOUS
  Filled 2023-09-20: qty 1

## 2023-09-20 NOTE — ED Notes (Signed)
 Phlebotomy at bedside.

## 2023-09-20 NOTE — Plan of Care (Signed)

## 2023-09-20 NOTE — ED Provider Notes (Addendum)
 [facility] Provider Note    Event Date/Time   First MD Initiated Contact with Patient 09/20/23 1339     (approximate)   History   Tremors and Shortness of Breath   HPI  Zachary Sweeney is a 78 y.o. male past medical history significant for obesity, COPD, who presents to the emergency department with chest and back pain.  States that he started having tremors and shaking just prior to arrival.  Then complaining of severe pain to his lower back and chest and complaining of sharp stabbing pain.  States that his chest pain radiates to his back and he is never had pain like this before.  Denies nausea or vomiting.  Denies any history of pulmonary embolism.  Denies recent falls or head trauma.     Physical Exam   Triage Vital Signs: ED Triage Vitals  Encounter Vitals Group     BP 09/20/23 1221 (!) 143/87     Girls Systolic BP Percentile --      Girls Diastolic BP Percentile --      Boys Systolic BP Percentile --      Boys Diastolic BP Percentile --      Pulse Rate 09/20/23 1221 (!) 110     Resp 09/20/23 1221 (!) 28     Temp --      Temp src --      SpO2 09/20/23 1221 95 %     Weight 09/20/23 1225 295 lb (133.8 kg)     Height 09/20/23 1225 6' 1 (1.854 m)     Head Circumference --      Peak Flow --      Pain Score 09/20/23 1223 7     Pain Loc --      Pain Education --      Exclude from Growth Chart --     Most recent vital signs: Vitals:   09/20/23 1528 09/20/23 1530  BP:  (!) 143/92  Pulse:  (!) 112  Resp:  16  Temp: 100.2 F (37.9 C)   SpO2:  92%    Physical Exam Constitutional:      General: He is in acute distress.     Appearance: He is well-developed. He is obese. He is ill-appearing.  HENT:     Head: Atraumatic.  Eyes:     Conjunctiva/sclera: Conjunctivae normal.  Cardiovascular:     Rate and Rhythm: Regular rhythm.     Comments: Intact and symmetric pulses, tachycardic Pulmonary:     Effort: Tachypnea and respiratory  distress present.     Comments: Hypoxic to 88% while speaking, improved back to 92% Abdominal:     Palpations: Abdomen is soft.     Tenderness: There is no abdominal tenderness.  Musculoskeletal:     Cervical back: Normal range of motion.     Right lower leg: Edema present.     Left lower leg: Edema present.  Skin:    General: Skin is warm.     Capillary Refill: Capillary refill takes less than 2 seconds.  Neurological:     General: No focal deficit present.     Mental Status: He is alert. Mental status is at baseline.     IMPRESSION / MDM / ASSESSMENT AND PLAN / ED COURSE  I reviewed the triage vital signs and the nursing notes.  On arrival patient was tachycardic, tachypneic and normotensive, 90% on room air  Differential diagnosis including dissection, ACS, anemia, pulmonary embolism, pneumonia, COPD exacerbation  EKG  I, Clotilda Punter, the attending physician, personally viewed and interpreted this ECG.   Sinus tachycardia with a heart rate of 111, narrow complex, normal intervals.  Signs of hypertrophy.  No signs of ST elevation or depression and no signs of acute ischemia or dysrhythmia.  Sinus tachycardia while on cardiac telemetry.  RADIOLOGY CTA chest abdomen and pelvis with no acute findings of acute aortic syndrome, findings of right sided mild to moderate central lobar irregular patchy infiltrates.  Scattered diverticula.  CT scan of the head with no signs of intracranial hemorrhage  Chest x-ray read as no acute findings of mild cardiomegaly LABS (all labs ordered are listed, but only abnormal results are displayed) Labs interpreted as -    Labs Reviewed  CBC - Abnormal; Notable for the following components:      Result Value   WBC 15.1 (*)    RBC 5.96 (*)    Hemoglobin 17.9 (*)    HCT 52.5 (*)    All other components within normal limits  URINALYSIS, W/ REFLEX TO CULTURE (INFECTION SUSPECTED) - Abnormal; Notable for the following components:   Color,  Urine YELLOW (*)    APPearance CLEAR (*)    Glucose, UA >=500 (*)    All other components within normal limits  CULTURE, BLOOD (ROUTINE X 2)  CULTURE, BLOOD (ROUTINE X 2)  BASIC METABOLIC PANEL WITH GFR  HEPATIC FUNCTION PANEL  LIPASE, BLOOD  LACTIC ACID, PLASMA  LACTIC ACID, PLASMA  MAGNESIUM  PHOSPHORUS  TROPONIN I (HIGH SENSITIVITY)  TROPONIN I (HIGH SENSITIVITY)     MDM  Temperature obtained at 100.2 orally.  Does have leukocytosis of 15 and tachycardic, meeting sepsis criteria.  Added on blood cultures.  Started on antibiotics to cover for community-acquired pneumonia with Pseudomonas risk factors given his history of COPD.  Given IV fluids.  Requiring 2 L of oxygen  Consulted  hospitalist for hospitalization for acute hypoxic respiratory failure and chest pain in the setting of pneumonia.     PROCEDURES:  Critical Care performed: No  .Critical Care  Performed by: Punter Clotilda, MD Authorized by: Punter Clotilda, MD   Critical care provider statement:    Critical care time (minutes):  30   Critical care time was exclusive of:  Separately billable procedures and treating other patients   Critical care was necessary to treat or prevent imminent or life-threatening deterioration of the following conditions:  Sepsis   Critical care was time spent personally by me on the following activities:  Development of treatment plan with patient or surrogate, discussions with consultants, evaluation of patient's response to treatment, examination of patient, ordering and review of laboratory studies, ordering and review of radiographic studies, ordering and performing treatments and interventions, pulse oximetry, re-evaluation of patient's condition and review of old charts   Care discussed with: admitting provider     Patient's presentation is most consistent with acute presentation with potential threat to life or bodily function.   MEDICATIONS ORDERED IN ED: Medications   sodium chloride  0.9 % bolus 1,000 mL (has no administration in time range)  lactated ringers  infusion (has no administration in time range)  ceFEPIme (MAXIPIME) 2 g in sodium chloride  0.9 % 100 mL IVPB (has no administration in time range)  azithromycin (ZITHROMAX) 500 mg in sodium chloride  0.9 % 250 mL IVPB (has no administration in time range)  acetaminophen  (TYLENOL ) tablet 650 mg (has no administration in time range)  morphine  (PF) 4 MG/ML injection 4 mg (4 mg  Intravenous Given 09/20/23 1351)  iohexol  (OMNIPAQUE ) 350 MG/ML injection 100 mL (100 mLs Intravenous Contrast Given 09/20/23 1403)  ondansetron  (ZOFRAN ) injection 4 mg (4 mg Intravenous Given 09/20/23 1358)    FINAL CLINICAL IMPRESSION(S) / ED DIAGNOSES   Final diagnoses:  COPD exacerbation (HCC)  Hypoxia  SOB (shortness of breath)  Community acquired pneumonia of right middle lobe of lung     Rx / DC Orders   ED Discharge Orders     None        Note:  This document was prepared using Dragon voice recognition software and may include unintentional dictation errors.   Suzanne Kirsch, MD 09/20/23 1550    Suzanne Kirsch, MD 09/20/23 8440

## 2023-09-20 NOTE — ED Notes (Signed)
 Unable to obtain 2nd set of cultures.  Main Lab contacted.

## 2023-09-20 NOTE — Sepsis Progress Note (Signed)
 Code Sepsis protocol being monitored by eLink.

## 2023-09-20 NOTE — ED Notes (Signed)
 Pt c/o severe low back pain which started when he was walking through is house this morning.  Denies injury.    Pt noted to be joking around and speaking full sentences.

## 2023-09-20 NOTE — Consult Note (Signed)
 CODE SEPSIS - PHARMACY COMMUNICATION  **Broad Spectrum Antibiotics should be administered within 1 hour of Sepsis diagnosis**  Time Code Sepsis Called/Page Received: 1536  Antibiotics Ordered: azithromycin and cefepime  Time of 1st antibiotic administration: 1617  Additional action taken by pharmacy: none  If necessary, Name of Provider/Nurse Contacted: n/a   Zion Ta Rodriguez-Guzman PharmD, BCPS 09/20/2023 3:43 PM

## 2023-09-20 NOTE — H&P (Signed)
 History and Physical    Patient: Zachary Sweeney DOB: [birth year] DOA: 09/20/2023 DOS: the patient was seen and examined on 09/20/2023 PCP: Ernie Yancy Roof, MD  Patient coming from: Home  Chief Complaint:  Chief Complaint  Patient presents with   Tremors   Shortness of Breath   HPI: Zachary Sweeney is a 78 y.o. male with medical history significant of GERD, osteoarthritis, colon polyps, lower extremity DVT, lung emphysema, erythrocytosis, hypertension, sleep apnea who presented to the emergency department via EMS with tremors and dyspnea that began 45 minutes PTA.  He also endorsed lower back pain.  He has been having runny nose for the past 2 days, but no sore throat or productive cough. He denied fever, night sweats wheezing or hemoptysis.  No chest pain, palpitations, diaphoresis, PND, orthopnea, but has pitting edema of the lower extremities.  No abdominal pain, nausea, emesis, diarrhea, constipation, melena or hematochezia.  No flank pain, dysuria, frequency or hematuria.  No polyuria, polydipsia, polyphagia or blurred vision.   Lab work: Urinalysis was clear with greater than 500 glucose but no other findings.  CBC showed a white count of 15.1, hemoglobin 17.9 g/dL platelets 847.  BMP, lipase, LFTs in the first troponin level were normal.  Imaging: 2 view chest radiograph showing mild cardiomegaly.  CTA chest/abdomen/pelvis with no evidence of acute arctic syndrome.  No pulmonary embolism.  Mild to moderate centrilobular and paraseptal emphysema.  Irregular reticulation in the posterior periphery of the lower lobes bilaterally.  Patchy, streaky opacities in the lateral periphery of the right middle lobe.  Scattered colonic diverticula without evidence of diverticulitis.   ED course: Initial vital signs were temperature 100.2 F, pulse 110, respiration 28, BP 143/87 mmHg O2 sat 95% on room air.  The patient received morphine  4 mg IVP Zofran  4 mg IVP, cefepime 2 g IVPB  and azithromycin 500 mg IVPB.  Review of Systems: As mentioned in the history of present illness. All other systems reviewed and are negative. Past Medical History:  Diagnosis Date   Acid reflux    Arthritis    Back injury    Colon polyp    DVT (deep venous thrombosis) (HCC)    a. 06/2017 LE U/S: Right popliteal vein DVT.   Dyspnea on exertion    a. 06/2017 CTA Chest: No PE. No significant coronary Ca2+, centrilobular and paraseptal emphysema;  b.  07/2017 Echo: EF 60-65%, no rwma, Gr1 DD, mildly dil LA. Nl RV fxn; c. 07/2017 MV: Small, severe defect involving apical inf and apical segments - only on rest images consistent w/ artifact. No ischemia or scar.   Emphysema of lung (HCC)    a. 06/2017 CTA Chest: Centrilobular and paraseptal emphysema, with mild geographic ground-glass opacity likely representing small airway dzs.   Erythrocytosis 04/17/2019   Hypertension    Sleep apnea    Torn ligament    Past Surgical History:  Procedure Laterality Date   COLONOSCOPY  2008   COLONOSCOPY N/A 05/04/2022   Procedure: COLONOSCOPY;  Surgeon: Onita Elspeth Sharper, DO;  Location: [facility] ENDOSCOPY;  Service: Gastroenterology;  Laterality: N/A;   COLONOSCOPY WITH PROPOFOL  N/A 02/27/2017   Procedure: COLONOSCOPY WITH PROPOFOL ;  Surgeon: Dessa Reyes ORN, MD;  Location: [facility] ENDOSCOPY;  Service: Endoscopy;  Laterality: N/A;   COLONOSCOPY WITH PROPOFOL  N/A 07/24/2018   Procedure: COLONOSCOPY WITH PROPOFOL ;  Surgeon: Janalyn Keene NOVAK, MD;  Location: [facility] ENDOSCOPY;  Service: Endoscopy;  Laterality: N/A;   ESOPHAGOGASTRODUODENOSCOPY (EGD) WITH PROPOFOL  N/A 02/27/2017  Procedure: ESOPHAGOGASTRODUODENOSCOPY (EGD) WITH PROPOFOL ;  Surgeon: Dessa Reyes ORN, MD;  Location: [facility] ENDOSCOPY;  Service: Endoscopy;  Laterality: N/A;   ESOPHAGOGASTRODUODENOSCOPY (EGD) WITH PROPOFOL  N/A 07/24/2018   Procedure: ESOPHAGOGASTRODUODENOSCOPY (EGD) WITH PROPOFOL ;  Surgeon: Janalyn Keene NOVAK, MD;  Location: [facility] ENDOSCOPY;   Service: Endoscopy;  Laterality: N/A;   HERNIA REPAIR Right    inguinal   INSERTION OF MESH  02/01/2021   Procedure: INSERTION OF MESH;  Surgeon: Rodolph Romano, MD;  Location: [facility] ORS;  Service: General;;   KNEE SURGERY Left    NECK SURGERY     ROTATOR CUFF REPAIR Right    SHOULDER ARTHROSCOPY WITH SUBACROMIAL DECOMPRESSION, ROTATOR CUFF REPAIR AND BICEP TENDON REPAIR Left 07/25/2021   Procedure: LEFT SHOULDER ARTHROSCOPY WITH DEBRIDEMENT, DECOMPRESSION, DISTAL CLAVICLE EXCISION, BICEPS TENODESIS;  Surgeon: Edie Norleen PARAS, MD;  Location: [facility] ORS;  Service: Orthopedics;  Laterality: Left;   UMBILICAL HERNIA REPAIR N/A 04/22/2019   Procedure: HERNIA REPAIR UMBILICAL ADULT;  Surgeon: Lane Shope, MD;  Location: [facility] ORS;  Service: General;  Laterality: N/A;   Social History:  reports that he quit smoking about 29 years ago. His smoking use included cigarettes. He started smoking about 59 years ago. He has a 7.5 pack-year smoking history. He has never used smokeless tobacco. He reports that he does not drink alcohol and does not use drugs.  No Known Allergies  Family History  Problem Relation Age of Onset   Lung cancer Mother    Heart disease Father    Prostate cancer Maternal Uncle     Prior to Admission medications   Medication Sig Start Date End Date Taking? Authorizing Provider  acetaminophen  (TYLENOL ) 500 MG tablet Take 1,000 mg by mouth every 6 (six) hours as needed for fever.    [provider]  albuterol  (VENTOLIN  HFA) 108 (90 Base) MCG/ACT inhaler 2 puffs every 4 (four) hours as needed for shortness of breath or wheezing. 07/08/19   [provider]  apixaban  (ELIQUIS ) 2.5 MG TABS tablet Take 1 tablet (2.5 mg total) by mouth 2 (two) times daily. 07/23/23   Babara Call, MD  clobetasol cream (TEMOVATE) 0.05 %  06/26/22   [provider]  diltiazem  (CARDIZEM  CD) 300 MG 24 hr capsule Take 1 capsule (300 mg total) by mouth daily. 09/04/17   Vivienne Lonni Ingle, NP  fluocinonide (LIDEX) 0.05 % external solution Apply 1 application topically daily.    [provider]  ketoconazole (NIZORAL) 2 % cream Apply topically daily. 09/22/21   [provider]  losartan (COZAAR) 50 MG tablet Take 50 mg by mouth daily. 12/12/21   [provider]  metoprolol succinate (TOPROL-XL) 50 MG 24 hr tablet     [provider]  omeprazole  (PRILOSEC) 20 MG capsule Take 1 capsule (20 mg total) by mouth daily. 07/25/21 07/23/23  Poggi, Norleen PARAS, MD  omeprazole  (PRILOSEC) 20 MG capsule  03/09/21   [provider]  oxyCODONE  (ROXICODONE ) 5 MG immediate release tablet Take 1-2 tablets (5-10 mg total) by mouth every 4 (four) hours as needed for severe pain or moderate pain. Patient not taking: Reported on 07/23/2023 07/25/21   Poggi, Norleen PARAS, MD  simethicone  (MYLICON) 80 MG chewable tablet Chew 1 tablet (80 mg total) by mouth 4 (four) times daily as needed (abdominal bloating or gas pains). Patient not taking: Reported on 07/23/2023 05/02/21   Fausto Sor A, DO  umeclidinium-vilanterol (ANORO ELLIPTA ) 62.5-25 MCG/INH AEPB Inhale 1 puff into the lungs daily. 02/03/20  [provider]  VIAGRA 50 MG tablet SMARTSIG:1 Tablet(s) By Mouth 06/27/21   [provider]    Physical Exam: Vitals:   09/20/23 1430 09/20/23 1500 09/20/23 1528 09/20/23 1530  BP: 138/79 139/77  (!) 143/92  Pulse: (!) 110 (!) 109  (!) 112  Resp: 17 10  16   Temp:   100.2 F (37.9 C)   TempSrc:   Oral   SpO2: 93% 90%  92%  Weight:      Height:       Physical Exam Vitals reviewed.  Constitutional:      General: He is awake. He is not in acute distress.    Appearance: He is obese. He is ill-appearing.     Interventions: Nasal cannula in place.  HENT:     Head: Normocephalic.     Nose: No rhinorrhea.     Mouth/Throat:     Mouth: Mucous membranes are moist.  Eyes:     General: No scleral icterus.    Pupils: Pupils are equal, round, and reactive to  light.  Neck:     Vascular: No JVD.  Cardiovascular:     Rate and Rhythm: Normal rate and regular rhythm.     Heart sounds: S1 normal and S2 normal.  Pulmonary:     Breath sounds: Examination of the right-lower field reveals decreased breath sounds and rales. Examination of the left-lower field reveals decreased breath sounds and rales. Decreased breath sounds and rales present. No wheezing or rhonchi.  Abdominal:     General: Abdomen is protuberant. Bowel sounds are normal. There is no distension.     Palpations: Abdomen is soft.     Tenderness: There is no abdominal tenderness. There is no right CVA tenderness or left CVA tenderness.  Musculoskeletal:     Cervical back: Neck supple.     Right lower leg: 1+ Pitting Edema present.     Left lower leg: 1+ Pitting Edema present.  Skin:    General: Skin is warm and dry.  Neurological:     General: No focal deficit present.     Mental Status: He is alert and oriented to person, place, and time.  Psychiatric:        Mood and Affect: Mood normal.        Behavior: Behavior normal. Behavior is cooperative.     Data Reviewed:  Results are pending, will review when available. 07/26/2017 echocardiogram report. History:   PMH:   Dyspnea.  Angina pectoris.  Risk factors:  Former  tobacco use. Hypertension. Obese.   -------------------------------------------------------------------  Study Conclusions   - Left ventricle: The cavity size was normal. Systolic function was    normal. The estimated ejection fraction was in the range of 60%    to 65%. Wall motion was normal; there were no regional wall    motion abnormalities. Doppler parameters are consistent with    abnormal left ventricular relaxation (grade 1 diastolic    dysfunction).  - Left atrium: The atrium was mildly dilated.  - Right ventricle: Systolic function was normal.  - Pulmonary arteries: Systolic pressure was within the normal    range.   EKG: Vent. rate 115 BPM PR  interval 140 ms QRS duration 88 ms QT/QTcB 382/528 ms P-R-T axes 64 248 71 Sinus tachycardia Right superior axis deviation Right ventricular hypertrophy Septal infarct , age undetermined Prolonged QT  Assessment and Plan: Principal Problem:   Acute respiratory failure with hypoxia (HCC) In the setting of:   CAP (  community acquired pneumonia) Superimposed on:   Emphysema of lung (HCC) Admit to telemetry/inpatient. Continue supplemental oxygen. Scheduled and as needed bronchodilators. Continue cefepime 2 g IVPB every 8 hours. (High risk for Pseudomonas infection). Continue azithromycin 500 mg IVPB daily. Check strep pneumoniae urinary antigen. Check sputum Gram stain, culture and sensitivity. Follow-up blood culture and sensitivity. Follow-up CBC and chemistry in the morning.  Active Problems:   Obstructive sleep apnea syndrome Continue CPAP at bedtime.    Hypophosphatemia Replacing.    Essential hypertension Continue diltiazem  300 mg every day. Continue losartan 50 mg p.o. daily.    Erythrocytosis In the setting of emphysema/OSA. Follow hematocrit and hemoglobin.    History of DVT of lower extremity Continue apixaban  2.5 mg p.o. twice daily.    Prolonged QT interval Avoid QT prolonging meds as possible. KCl supplementation Magnesium sulfate 2 g IVPB 1 Keep electrolytes optimized. Check EKG in the morning.    Class 2 obesity Current BMI 38.92 kg/m. Would benefit from lifestyle modifications. Follow-up closely with PCP.      Advance Care Planning:   Code Status: Full Code   Consults:   Family Communication:   Severity of Illness: The appropriate patient status for this patient is INPATIENT. Inpatient status is judged to be reasonable and necessary in order to provide the required intensity of service to ensure the patient's safety. The patient's presenting symptoms, physical exam findings, and initial radiographic and laboratory data in the context of  their chronic comorbidities is felt to place them at high risk for further clinical deterioration. Furthermore, it is not anticipated that the patient will be medically stable for discharge from the hospital within 2 midnights of admission.   * I certify that at the point of admission it is my clinical judgment that the patient will require inpatient hospital care spanning beyond 2 midnights from the point of admission due to high intensity of service, high risk for further deterioration and high frequency of surveillance required.*  Author: Alm Dorn Castor, MD 09/20/2023 3:56 PM  For on call review [email or web address].   This document was prepared using Dragon voice recognition software and may contain some unintended transcription errors.

## 2023-09-20 NOTE — ED Notes (Signed)
 Patient transported to CT

## 2023-09-20 NOTE — ED Triage Notes (Signed)
 Pt to ED via [facility] from home for c/o tremors that began about 45 minutes ago. Pt also c/o lower back pain. Pt has hx HTN, COPD. Pt A&O.

## 2023-09-21 DIAGNOSIS — Z6838 Body mass index (BMI) 38.0-38.9, adult: Secondary | ICD-10-CM | POA: Diagnosis not present

## 2023-09-21 DIAGNOSIS — R0602 Shortness of breath: Secondary | ICD-10-CM | POA: Diagnosis present

## 2023-09-21 DIAGNOSIS — J9601 Acute respiratory failure with hypoxia: Secondary | ICD-10-CM | POA: Diagnosis present

## 2023-09-21 DIAGNOSIS — Z8249 Family history of ischemic heart disease and other diseases of the circulatory system: Secondary | ICD-10-CM | POA: Diagnosis not present

## 2023-09-21 DIAGNOSIS — R9431 Abnormal electrocardiogram [ECG] [EKG]: Secondary | ICD-10-CM | POA: Diagnosis present

## 2023-09-21 DIAGNOSIS — J438 Other emphysema: Secondary | ICD-10-CM | POA: Diagnosis present

## 2023-09-21 DIAGNOSIS — G4733 Obstructive sleep apnea (adult) (pediatric): Secondary | ICD-10-CM | POA: Diagnosis present

## 2023-09-21 DIAGNOSIS — J441 Chronic obstructive pulmonary disease with (acute) exacerbation: Secondary | ICD-10-CM | POA: Diagnosis present

## 2023-09-21 DIAGNOSIS — Z86718 Personal history of other venous thrombosis and embolism: Secondary | ICD-10-CM | POA: Diagnosis not present

## 2023-09-21 DIAGNOSIS — Z801 Family history of malignant neoplasm of trachea, bronchus and lung: Secondary | ICD-10-CM | POA: Diagnosis not present

## 2023-09-21 DIAGNOSIS — Z7901 Long term (current) use of anticoagulants: Secondary | ICD-10-CM | POA: Diagnosis not present

## 2023-09-21 DIAGNOSIS — Z87891 Personal history of nicotine dependence: Secondary | ICD-10-CM | POA: Diagnosis not present

## 2023-09-21 DIAGNOSIS — J189 Pneumonia, unspecified organism: Secondary | ICD-10-CM | POA: Diagnosis present

## 2023-09-21 DIAGNOSIS — Z79899 Other long term (current) drug therapy: Secondary | ICD-10-CM | POA: Diagnosis not present

## 2023-09-21 DIAGNOSIS — E66812 Obesity, class 2: Secondary | ICD-10-CM | POA: Diagnosis present

## 2023-09-21 DIAGNOSIS — Z860100 Personal history of colon polyps, unspecified: Secondary | ICD-10-CM | POA: Diagnosis not present

## 2023-09-21 DIAGNOSIS — D751 Secondary polycythemia: Secondary | ICD-10-CM | POA: Diagnosis present

## 2023-09-21 DIAGNOSIS — Z8042 Family history of malignant neoplasm of prostate: Secondary | ICD-10-CM | POA: Diagnosis not present

## 2023-09-21 DIAGNOSIS — R000 Tachycardia, unspecified: Secondary | ICD-10-CM | POA: Diagnosis present

## 2023-09-21 DIAGNOSIS — I10 Essential (primary) hypertension: Secondary | ICD-10-CM | POA: Diagnosis present

## 2023-09-21 DIAGNOSIS — J432 Centrilobular emphysema: Secondary | ICD-10-CM | POA: Diagnosis present

## 2023-09-21 DIAGNOSIS — J440 Chronic obstructive pulmonary disease with acute lower respiratory infection: Secondary | ICD-10-CM | POA: Diagnosis present

## 2023-09-21 LAB — CBC
HCT: 47.9 % (ref 39.0–52.0)
Hemoglobin: 16.3 g/dL (ref 13.0–17.0)
MCH: 29.9 pg (ref 26.0–34.0)
MCHC: 34 g/dL (ref 30.0–36.0)
MCV: 87.7 fL (ref 80.0–100.0)
Platelets: 136 K/uL — ABNORMAL LOW (ref 150–400)
RBC: 5.46 MIL/uL (ref 4.22–5.81)
RDW: 14 % (ref 11.5–15.5)
WBC: 16.3 K/uL — ABNORMAL HIGH (ref 4.0–10.5)
nRBC: 0 % (ref 0.0–0.2)

## 2023-09-21 LAB — COMPREHENSIVE METABOLIC PANEL WITH GFR
ALT: 16 U/L (ref 0–44)
AST: 19 U/L (ref 15–41)
Albumin: 3.5 g/dL (ref 3.5–5.0)
Alkaline Phosphatase: 36 U/L — ABNORMAL LOW (ref 38–126)
Anion gap: 10 (ref 5–15)
BUN: 12 mg/dL (ref 8–23)
CO2: 21 mmol/L — ABNORMAL LOW (ref 22–32)
Calcium: 9.1 mg/dL (ref 8.9–10.3)
Chloride: 103 mmol/L (ref 98–111)
Creatinine, Ser: 1.15 mg/dL (ref 0.61–1.24)
GFR, Estimated: >60 mL/min (ref >60)
Glucose, Bld: 102 mg/dL — ABNORMAL HIGH (ref 70–99)
Potassium: 4.4 mmol/L (ref 3.5–5.1)
Sodium: 134 mmol/L — ABNORMAL LOW (ref 135–145)
Total Bilirubin: 1.5 mg/dL — ABNORMAL HIGH (ref 0.0–1.2)
Total Protein: 7.1 g/dL (ref 6.5–8.1)

## 2023-09-21 LAB — STREP PNEUMONIAE URINARY ANTIGEN: Strep Pneumo Urinary Antigen: NEGATIVE

## 2023-09-21 MED ORDER — MENTHOL 3 MG MT LOZG
1.0000 | LOZENGE | Freq: Three times a day (TID) | OROMUCOSAL | Status: DC
  Administered 2023-09-21 – 2023-09-23 (×5): 3 mg via ORAL
  Filled 2023-09-21: qty 9

## 2023-09-21 MED ORDER — PREDNISONE 20 MG PO TABS
40.0000 mg | ORAL_TABLET | Freq: Every day | ORAL | Status: DC
  Administered 2023-09-21 – 2023-09-23 (×3): 40 mg via ORAL
  Filled 2023-09-21 (×3): qty 2

## 2023-09-21 NOTE — Progress Notes (Signed)
 Progress Note   Patient: Zachary Sweeney FMW:[id number] DOB: May 14, 1945 DOA: 09/20/2023     1 DOS: the patient was seen and examined on 09/21/2023   Brief hospital course:  Zachary Sweeney is a 78 y.o. male with medical history significant of GERD, osteoarthritis, colon polyps, lower extremity DVT, lung emphysema, erythrocytosis, hypertension, sleep apnea who presented to the emergency department via EMS with tremors and dyspnea that began 45 minutes PTA.  He also endorsed lower back pain.  He has been having runny nose for the past 2 days, but no sore throat or productive cough. He denied fever, night sweats wheezing or hemoptysis.  No chest pain, palpitations, diaphoresis, PND, orthopnea, but has pitting edema of the lower extremities.  No abdominal pain, nausea, emesis, diarrhea, constipation, melena or hematochezia.  No flank pain, dysuria, frequency or hematuria.  No polyuria, polydipsia, polyphagia or blurred vision.    Lab work: Urinalysis was clear with greater than 500 glucose but no other findings.  CBC showed a white count of 15.1, hemoglobin 17.9 g/dL platelets 847.  BMP, lipase, LFTs in the first troponin level were normal.   Imaging: 2 view chest radiograph showing mild cardiomegaly.  CTA chest/abdomen/pelvis with no evidence of acute arctic syndrome.  No pulmonary embolism.  Mild to moderate centrilobular and paraseptal emphysema.  Irregular reticulation in the posterior periphery of the lower lobes bilaterally.  Patchy, streaky opacities in the lateral periphery of the right middle lobe.  Scattered colonic diverticula without evidence of diverticulitis.   ED course: Initial vital signs were temperature 100.2 F, pulse 110, respiration 28, BP 143/87 mmHg O2 sat 95% on room air.  The patient received morphine  4 mg IVP Zofran  4 mg IVP, cefepime  2 g IVPB and azithromycin  500 mg IVPB.    Assessment and Plan:  Acute respiratory failure with hypoxia (HCC) CAP (community acquired  pneumonia) Emphysema of lung (HCC) Patient has been weaned off oxygen Continue scheduled and as needed bronchodilators. Continue empiric antibiotic therapy with Rocephin  and Zithromax  to complete a 5 day course of therapy. Strep Pneumoniae is negative    Obstructive sleep apnea syndrome Continue CPAP at bedtime.     Hypophosphatemia   Supplemented     Essential hypertension Continue diltiazem  300 mg every day. Continue losartan  50 mg p.o. daily.     Erythrocytosis In the setting of emphysema/OSA. Follow hematocrit and hemoglobin. Stable     History of DVT of lower extremity Continue apixaban  2.5 mg p.o. twice daily.     Prolonged QT interval Avoid QT prolonging meds as possible. KCl supplementation Magnesium  sulfate 2 g IVPB 1 Keep electrolytes optimized. Check EKG in the morning.     Class 2 obesity Current BMI 38.92 kg/m. Would benefit from lifestyle modifications.             Subjective: Patient is seen and examined at the bedside. Complains of a sore throat  Physical Exam: Vitals:   09/20/23 1712 09/20/23 1923 09/21/23 0302 09/21/23 0807  BP: 137/75 113/68 126/69 120/78  Pulse: (!) 107  84 71  Resp: 16 18 18 17   Temp: 99 F (37.2 C) 99.5 F (37.5 C) 98.7 F (37.1 C) 98.2 F (36.8 C)  TempSrc: Oral Oral  Oral  SpO2: 97% 98% 96% 98%  Weight:      Height:       Vitals reviewed.  Constitutional:      General: He is awake. He is not in acute distress.    Appearance: He is  obese. He is ill-appearing.     Interventions: Nasal cannula in place.  HENT:     Head: Normocephalic.     Nose: No rhinorrhea.     Mouth/Throat:     Mouth: Mucous membranes are moist.  Eyes:     General: No scleral icterus.    Pupils: Pupils are equal, round, and reactive to light.  Neck:     Vascular: No JVD.  Cardiovascular:     Rate and Rhythm: Normal rate and regular rhythm.     Heart sounds: S1 normal and S2 normal.  Pulmonary:     Breath sounds: Examination of  the right-lower field reveals decreased breath sounds and rales. Examination of the left-lower field reveals decreased breath sounds and rales. Decreased breath sounds and rales present. No wheezing or rhonchi.  Abdominal:     General: Abdomen is protuberant. Bowel sounds are normal. There is no distension.     Palpations: Abdomen is soft.     Tenderness: There is no abdominal tenderness. There is no right CVA tenderness or left CVA tenderness.  Musculoskeletal:     Cervical back: Neck supple.     Right lower leg: 1+ Pitting Edema present.     Left lower leg: 1+ Pitting Edema present.  Skin:    General: Skin is warm and dry.  Neurological:     General: No focal deficit present.     Mental Status: He is alert and oriented to person, place, and time.  Psychiatric:        Mood and Affect: Mood normal.        Behavior: Behavior normal. Behavior is cooperative.    Data Reviewed: Wbc 16.3, Hco3 21 Labs reviewed  Family Communication: Patient is seen and examined at the bedside  Disposition: Status is: Inpatient Remains inpatient appropriate because: On IV antibiotics  Planned Discharge Destination: Home    Time spent: 38 minutes  Author: Aimee Somerset, MD 09/21/2023 1:49 PM  For on call review [email or web address].

## 2023-09-21 NOTE — Care Management CC44 (Signed)
 Condition Code 44 Documentation Completed  Patient Details  Name: Zachary Sweeney MRN: [id number] Date of Birth: 1945-12-13   Condition Code 44 given:  Yes Patient signature on Condition Code 44 notice:  Yes Documentation of 2 MD's agreement:  Yes Code 44 added to claim:  Yes    Marinda Cooks, RN 09/21/2023, 5:49 PM

## 2023-09-21 NOTE — Progress Notes (Signed)
SATURATION QUALIFICATIONS: (This note is used to comply with regulatory documentation for home oxygen)  Patient Saturations on Room Air at Rest = 96%  Patient Saturations on Room Air while Ambulating = 88%  

## 2023-09-22 DIAGNOSIS — J9601 Acute respiratory failure with hypoxia: Secondary | ICD-10-CM | POA: Diagnosis not present

## 2023-09-22 LAB — BASIC METABOLIC PANEL WITH GFR
Anion gap: 7 (ref 5–15)
BUN: 17 mg/dL (ref 8–23)
CO2: 20 mmol/L — ABNORMAL LOW (ref 22–32)
Calcium: 9.6 mg/dL (ref 8.9–10.3)
Chloride: 107 mmol/L (ref 98–111)
Creatinine, Ser: 1.23 mg/dL (ref 0.61–1.24)
GFR, Estimated: >60 mL/min (ref >60)
Glucose, Bld: 130 mg/dL — ABNORMAL HIGH (ref 70–99)
Potassium: 4.6 mmol/L (ref 3.5–5.1)
Sodium: 134 mmol/L — ABNORMAL LOW (ref 135–145)

## 2023-09-22 LAB — PHOSPHORUS
Phosphorus: 1 mg/dL — CL (ref 2.5–4.6)
Phosphorus: 2.8 mg/dL (ref 2.5–4.6)

## 2023-09-22 LAB — CBC
HCT: 50.1 % (ref 39.0–52.0)
Hemoglobin: 17 g/dL (ref 13.0–17.0)
MCH: 29.7 pg (ref 26.0–34.0)
MCHC: 33.9 g/dL (ref 30.0–36.0)
MCV: 87.4 fL (ref 80.0–100.0)
Platelets: 142 K/uL — ABNORMAL LOW (ref 150–400)
RBC: 5.73 MIL/uL (ref 4.22–5.81)
RDW: 13.6 % (ref 11.5–15.5)
WBC: 9 K/uL (ref 4.0–10.5)
nRBC: 0 % (ref 0.0–0.2)

## 2023-09-22 LAB — MAGNESIUM: Magnesium: 2.3 mg/dL (ref 1.7–2.4)

## 2023-09-22 MED ORDER — SODIUM CHLORIDE 0.9 % IV SOLN
1.0000 g | INTRAVENOUS | Status: DC
  Administered 2023-09-22: 1 g via INTRAVENOUS
  Filled 2023-09-22 (×2): qty 10

## 2023-09-22 MED ORDER — SODIUM PHOSPHATES 45 MMOLE/15ML IV SOLN
45.0000 mmol | Freq: Once | INTRAVENOUS | Status: AC
  Administered 2023-09-22: 45 mmol via INTRAVENOUS
  Filled 2023-09-22: qty 15

## 2023-09-22 NOTE — Plan of Care (Signed)

## 2023-09-22 NOTE — Progress Notes (Addendum)
 Progress Note   Patient: Zachary Sweeney FMW:[id number] DOB: 04-11-1945 DOA: 09/20/2023     2 DOS: the patient was seen and examined on 09/22/2023   Brief hospital course:  GAHEL SAFLEY is a 78 y.o. male with medical history significant of GERD, osteoarthritis, colon polyps, lower extremity DVT, lung emphysema, erythrocytosis, hypertension, sleep apnea who presented to the emergency department via EMS with tremors and dyspnea that began 45 minutes PTA.  He also endorsed lower back pain.  He has been having runny nose for the past 2 days, but no sore throat or productive cough. He denied fever, night sweats wheezing or hemoptysis.  No chest pain, palpitations, diaphoresis, PND, orthopnea, but has pitting edema of the lower extremities.  No abdominal pain, nausea, emesis, diarrhea, constipation, melena or hematochezia.  No flank pain, dysuria, frequency or hematuria.  No polyuria, polydipsia, polyphagia or blurred vision.    Lab work: Urinalysis was clear with greater than 500 glucose but no other findings.  CBC showed a white count of 15.1, hemoglobin 17.9 g/dL platelets 847.  BMP, lipase, LFTs in the first troponin level were normal.   Imaging: 2 view chest radiograph showing mild cardiomegaly.  CTA chest/abdomen/pelvis with no evidence of acute arctic syndrome.  No pulmonary embolism.  Mild to moderate centrilobular and paraseptal emphysema.  Irregular reticulation in the posterior periphery of the lower lobes bilaterally.  Patchy, streaky opacities in the lateral periphery of the right middle lobe.  Scattered colonic diverticula without evidence of diverticulitis.   ED course: Initial vital signs were temperature 100.2 F, pulse 110, respiration 28, BP 143/87 mmHg O2 sat 95% on room air.  The patient received morphine  4 mg IVP Zofran  4 mg IVP, cefepime  2 g IVPB and azithromycin  500 mg IVPB.    Assessment and Plan:  Acute respiratory failure with hypoxia (HCC) CAP (community acquired  pneumonia) Emphysema of lung (HCC) Patient has been weaned off oxygen and at rest her pulse oximetry over 92% With exertion pulse oximetry dropped to 88 - 89% with worsening shortness of breath when compared to his baseline Continue systemic and inhaled steroids Continue scheduled and as needed bronchodilators. Continue empiric antibiotic therapy with Rocephin  and Zithromax  to complete a 5 day course of therapy. Strep Pneumoniae is negative     Obstructive sleep apnea syndrome Continue CPAP at bedtime.     Hypophosphatemia   Supplemented     Essential hypertension Continue diltiazem  300 mg every day. Continue losartan  50 mg p.o. daily.     Erythrocytosis In the setting of emphysema/OSA. Follow hematocrit and hemoglobin. Stable     History of DVT of lower extremity Continue apixaban  2.5 mg p.o. twice daily.     Prolonged QT interval Avoid QT prolonging meds as possible. KCl supplementation Magnesium  sulfate 2 g IVPB 1 Keep electrolytes optimized. Check EKG in the morning.     Class 2 obesity Current BMI 38.92 kg/m. Would benefit from lifestyle modifications.      Subjective: Short of breath but improved from admission  Physical Exam: Vitals:   09/21/23 1619 09/21/23 2039 09/22/23 0452 09/22/23 0756  BP: (!) 108/59 132/70 130/77 124/69  Pulse: 62 75 62 (!) 58  Resp: 17 16 20 17   Temp: 98 F (36.7 C) 97.9 F (36.6 C) 97.7 F (36.5 C) (!) 97.4 F (36.3 C)  TempSrc: Oral   Oral  SpO2: 96% 95% 97% 96%  Weight:      Height:       Constitutional:  General: He is awake. He is not in acute distress.    Appearance: He is obese. He is ill-appearing.     Interventions: Nasal cannula in place.  HENT:     Head: Normocephalic.     Nose: No rhinorrhea.     Mouth/Throat:     Mouth: Mucous membranes are moist.  Eyes:     General: No scleral icterus.    Pupils: Pupils are equal, round, and reactive to light.  Neck:     Vascular: No JVD.  Cardiovascular:      Rate and Rhythm: Normal rate and regular rhythm.     Heart sounds: S1 normal and S2 normal.  Pulmonary:     Breath sounds: Examination of the right-lower field reveals decreased breath sounds and rales. Examination of the left-lower field reveals decreased breath sounds and rales. Decreased breath sounds and rales present. No wheezing or rhonchi.  Abdominal:     General: Abdomen is protuberant. Bowel sounds are normal. There is no distension.     Palpations: Abdomen is soft.     Tenderness: There is no abdominal tenderness. There is no right CVA tenderness or left CVA tenderness.  Musculoskeletal:     Cervical back: Neck supple.     Right lower leg: 1+ Pitting Edema present.     Left lower leg: 1+ Pitting Edema present.  Skin:    General: Skin is warm and dry.  Neurological:     General: No focal deficit present.     Mental Status: He is alert and oriented to person, place, and time.  Psychiatric:        Mood and Affect: Mood normal.        Behavior: Behavior normal. Behavior is cooperative.        Data Reviewed: Phosphorus 1.0, sodium 134, bicarb 20 Labs reviewed  Family Communication: Plan of care discussed with patient at the bedside.  He verbalizes understanding and agrees with the plan  Disposition: Status is: Inpatient Remains inpatient appropriate because: Possible discharge in a.m.  Planned Discharge Destination: Home with Home Health    Time spent: 40 minutes  Author: Aimee Somerset, MD 09/22/2023 12:51 PM  For on call review [email or web address].

## 2023-09-22 NOTE — Progress Notes (Signed)
 PHARMACY CONSULT NOTE - FOLLOW UP  Pharmacy Consult for Electrolyte Monitoring and Replacement   Recent Labs: Potassium (mmol/L)  Date Value  09/22/2023 4.6   Magnesium  (mg/dL)  Date Value  91/96/7974 2.3   Calcium (mg/dL)  Date Value  91/96/7974 9.6   Albumin  (g/dL)  Date Value  91/97/7974 3.5  04/15/2018 4.2   Phosphorus (mg/dL)  Date Value  91/96/7974 1.0 (LL)   Sodium (mmol/L)  Date Value  09/22/2023 134 (L)  04/15/2018 139     Assessment: 8/3:  Phos @ 0413 = 1.0   Goal of Therapy:  Electrolytes WNL   Plan:  Na Phos 45 mmol IV X 1 ordered for 8/3 @ ~ 0800. - will recheck Phos on 8/3 @ 1800   Izick Gasbarro D ,PharmD Clinical Pharmacist 09/22/2023 5:45 AM

## 2023-09-22 NOTE — Progress Notes (Signed)
 PT Cancellation Note  Patient Details Name: CHRISS MANNAN MRN: [id number] DOB: [birth year]   Cancelled Treatment:    Reason Eval/Treat Not Completed: PT screened, no needs identified, will sign off. Patient ambulated with RN, she and patient state he is independent with mobility, just gets short of breath. Will sign off at this time.    Destyn Schuyler 09/22/2023, 2:05 PM

## 2023-09-22 NOTE — Progress Notes (Signed)
SATURATION QUALIFICATIONS: (This note is used to comply with regulatory documentation for home oxygen)  Patient Saturations on Room Air at Rest = 96%  Patient Saturations on Room Air while Ambulating = 90%  Patient Saturations on 0 Liters of oxygen while Ambulating = 90%  Please briefly explain why patient needs home oxygen:

## 2023-09-22 NOTE — Plan of Care (Signed)
   Problem: Education: Goal: Knowledge of General Education information will improve Description Including pain rating scale, medication(s)/side effects and non-pharmacologic comfort measures Outcome: Progressing

## 2023-09-23 ENCOUNTER — Encounter: Payer: Self-pay | Admitting: Oncology

## 2023-09-23 ENCOUNTER — Other Ambulatory Visit: Payer: Self-pay

## 2023-09-23 DIAGNOSIS — J9601 Acute respiratory failure with hypoxia: Secondary | ICD-10-CM | POA: Diagnosis not present

## 2023-09-23 LAB — BASIC METABOLIC PANEL WITH GFR
Anion gap: 7 (ref 5–15)
BUN: 19 mg/dL (ref 8–23)
CO2: 24 mmol/L (ref 22–32)
Calcium: 9.3 mg/dL (ref 8.9–10.3)
Chloride: 104 mmol/L (ref 98–111)
Creatinine, Ser: 0.99 mg/dL (ref 0.61–1.24)
GFR, Estimated: >60 mL/min (ref >60)
Glucose, Bld: 111 mg/dL — ABNORMAL HIGH (ref 70–99)
Potassium: 3.9 mmol/L (ref 3.5–5.1)
Sodium: 135 mmol/L (ref 135–145)

## 2023-09-23 LAB — PHOSPHORUS: Phosphorus: 2.9 mg/dL (ref 2.5–4.6)

## 2023-09-23 LAB — MAGNESIUM: Magnesium: 2.4 mg/dL (ref 1.7–2.4)

## 2023-09-23 MED ORDER — PREDNISONE 20 MG PO TABS
40.0000 mg | ORAL_TABLET | Freq: Every day | ORAL | 0 refills | Status: AC
  Filled 2023-09-23: qty 6, 3d supply, fill #0

## 2023-09-23 MED ORDER — DOXYCYCLINE HYCLATE 100 MG PO TABS
100.0000 mg | ORAL_TABLET | Freq: Two times a day (BID) | ORAL | 0 refills | Status: AC
  Filled 2023-09-23: qty 6, 3d supply, fill #0

## 2023-09-23 MED ORDER — LOSARTAN POTASSIUM 50 MG PO TABS
50.0000 mg | ORAL_TABLET | Freq: Every day | ORAL | 0 refills | Status: AC
  Filled 2023-09-23: qty 30, 30d supply, fill #0

## 2023-09-23 NOTE — Discharge Summary (Signed)
 Physician Discharge Summary   Patient: Zachary Sweeney MRN: [id number] DOB: 11-04-1945  Admit date:     09/20/2023  Discharge date: 09/23/23  Discharge Physician: Donold Marotto   PCP: Ernie Yancy Roof, MD   Recommendations at discharge:      Discharge Diagnoses: Principal Problem:   Acute respiratory failure with hypoxia (HCC) Active Problems:   Essential hypertension   Erythrocytosis   Emphysema of lung (HCC)   History of DVT of lower extremity   Prolonged QT interval   Class 2 obesity   Obstructive sleep apnea syndrome   Hypophosphatemia   CAP (community acquired pneumonia)  Resolved Problems:   * No resolved hospital problems. *  Hospital Course:  Zachary Sweeney is a 78 y.o. male with medical history significant of GERD, osteoarthritis, colon polyps, lower extremity DVT, lung emphysema, erythrocytosis, hypertension, sleep apnea who presented to the emergency department via EMS with tremors and dyspnea that began 45 minutes PTA.  He also endorsed lower back pain.  He has been having runny nose for the past 2 days, but no sore throat or productive cough. He denied fever, night sweats wheezing or hemoptysis.  No chest pain, palpitations, diaphoresis, PND, orthopnea, but has pitting edema of the lower extremities.  No abdominal pain, nausea, emesis, diarrhea, constipation, melena or hematochezia.  No flank pain, dysuria, frequency or hematuria.  No polyuria, polydipsia, polyphagia or blurred vision.    Lab work: Urinalysis was clear with greater than 500 glucose but no other findings.  CBC showed a white count of 15.1, hemoglobin 17.9 g/dL platelets 847.  BMP, lipase, LFTs in the first troponin level were normal.   Imaging: 2 view chest radiograph showing mild cardiomegaly.  CTA chest/abdomen/pelvis with no evidence of acute arctic syndrome.  No pulmonary embolism.  Mild to moderate centrilobular and paraseptal emphysema.  Irregular reticulation in the posterior periphery  of the lower lobes bilaterally.  Patchy, streaky opacities in the lateral periphery of the right middle lobe.  Scattered colonic diverticula without evidence of diverticulitis.   ED course: Initial vital signs were temperature 100.2 F, pulse 110, respiration 28, BP 143/87 mmHg O2 sat 95% on room air.  The patient received morphine  4 mg IVP Zofran  4 mg IVP, cefepime  2 g IVPB and azithromycin  500 mg IVPB.     Assessment and Plan:  Acute respiratory failure with hypoxia (HCC) CAP (community acquired pneumonia) Emphysema of lung (HCC) Patient has been weaned off oxygen and at rest his pulse oximetry over 92% Patient was ambulated in the hall prior to discharge and on room air with exertion pulse oximetry was 92%.  He remains short of breath which is his baseline but improved from admission Continue systemic and inhaled steroids Continue scheduled and as needed bronchodilators. Strep Pneumoniae is negative Will discharge on a course of doxycycline  to complete antibiotic therapy     Obstructive sleep apnea syndrome Continue CPAP at bedtime.     Hypophosphatemia   Supplemented     Essential hypertension Continue diltiazem  300 mg every day. Continue losartan  50 mg p.o. daily.     Erythrocytosis In the setting of emphysema/OSA. Follow hematocrit and hemoglobin. Stable     History of DVT of lower extremity Continue apixaban  2.5 mg p.o. twice daily.     Prolonged QT interval Avoid QT prolonging meds as possible. KCl supplementation Magnesium  sulfate 2 g IVPB 1 Keep electrolytes optimized. Check EKG in the morning.     Class 2 obesity Current BMI 38.92 kg/m. Would  benefit from lifestyle modifications.          Consultants: None Procedures performed: None Disposition: Home Diet recommendation:  Discharge Diet Orders (From admission, onward)     Start     Ordered   09/23/23 0000  Diet - low sodium heart healthy        09/23/23 0951           Cardiac  diet DISCHARGE MEDICATION: Allergies as of 09/23/2023   No Known Allergies      Medication List     TAKE these medications    acetaminophen  500 MG tablet Commonly known as: TYLENOL  Take 1,000 mg by mouth every 6 (six) hours as needed for fever.   albuterol  108 (90 Base) MCG/ACT inhaler Commonly known as: VENTOLIN  HFA 2 puffs every 4 (four) hours as needed for shortness of breath or wheezing.   Anoro Ellipta  62.5-25 MCG/INH Aepb Generic drug: umeclidinium-vilanterol Inhale 1 puff into the lungs daily.   apixaban  2.5 MG Tabs tablet Commonly known as: Eliquis  Take 1 tablet (2.5 mg total) by mouth 2 (two) times daily.   clobetasol cream 0.05 % Commonly known as: TEMOVATE   diltiazem  300 MG 24 hr capsule Commonly known as: CARDIZEM  CD Take 1 capsule (300 mg total) by mouth daily.   doxycycline  100 MG tablet Commonly known as: VIBRA -TABS Take 1 tablet (100 mg total) by mouth 2 (two) times daily for 3 days.   fluocinonide 0.05 % external solution Commonly known as: LIDEX Apply 1 application topically daily.   Jardiance 10 MG Tabs tablet Generic drug: empagliflozin Take 10 mg by mouth daily.   ketoconazole 2 % cream Commonly known as: NIZORAL Apply topically daily.   losartan  50 MG tablet Commonly known as: COZAAR  Take 1 tablet (50 mg total) by mouth daily. What changed: Another medication with the same name was removed. Continue taking this medication, and follow the directions you see here.   omeprazole  20 MG capsule Commonly known as: PRILOSEC Take 20 mg by mouth daily.   predniSONE  20 MG tablet Commonly known as: DELTASONE  Take 2 tablets (40 mg total) by mouth daily with breakfast for 3 days.   Viagra 50 MG tablet Generic drug: sildenafil SMARTSIG:1 Tablet(s) By Mouth        Follow-up Information     Revelo, Yancy Roof, MD Follow up on 10/01/2023.   Specialty: Family Medicine Why: Tuesday, August 12 at 9:20 a.m. -   *Please bring copy of  insurance card, photo ID, and proof of address* Contact information: [address] [place] [address] [place] [address] [phone]                Discharge Exam: Fredricka Weights   09/20/23 1225  Weight: 133.8 kg   General: He is awake. He is not in acute distress.    Appearance: He is obese. He is ill-appearing.     Interventions: Nasal cannula in place.  HENT:     Head: Normocephalic.     Nose: No rhinorrhea.     Mouth/Throat:     Mouth: Mucous membranes are moist.  Eyes:     General: No scleral icterus.    Pupils: Pupils are equal, round, and reactive to light.  Neck:     Vascular: No JVD.  Cardiovascular:     Rate and Rhythm: Normal rate and regular rhythm.     Heart sounds: S1 normal and S2 normal.  Pulmonary:     BLAE Abdominal:     General: Abdomen  is protuberant. Bowel sounds are normal. There is no distension.     Palpations: Abdomen is soft.     Tenderness: There is no abdominal tenderness. There is no right CVA tenderness or left CVA tenderness.  Musculoskeletal:     Cervical back: Neck supple.     Right lower leg: No pedal edema      Left lower leg: No pedal edema  Skin:    General: Skin is warm and dry.  Neurological:     General: No focal deficit present.     Mental Status: He is alert and oriented to person, place, and time.  Psychiatric:        Mood and Affect: Mood normal.        Behavior: Behavior normal. Behavior is cooperative.          Condition at discharge: stable  The results of significant diagnostics from this hospitalization (including imaging, microbiology, ancillary and laboratory) are listed below for reference.   Imaging Studies: CT Angio Chest/Abd/Pel for Dissection W and/or Wo Contrast Result Date: 09/20/2023 EXAM: CTA CHEST, ABDOMEN AND PELVIS WITHOUT AND WITH CONTRAST 09/20/2023 02:28:39 PM TECHNIQUE: CTA of the chest was performed without and with the administration of intravenous contrast. CTA of the abdomen and pelvis was  performed without and with the administration of intravenous contrast. Multiplanar reformatted images are provided for review. MIP images are provided for review. Automated exposure control, iterative reconstruction, and/or weight based adjustment of the mA/kV was utilized to reduce the radiation dose to as low as reasonably achievable. COMPARISON: CT of the abdomen and pelvis dated 04/25/2021. CLINICAL HISTORY: Acute aortic syndrome (AAS) suspected. Pt to ED via [facility] from home for c/o tremors that began about 45 minutes ago. Pt also c/o lower back pain. Pt has hx HTN, COPD. Pt A\T\O. FINDINGS: VASCULATURE: AORTA: The ascending thoracic aorta is mildly ectatic measuring approximately 3.8 x 3.7 cm in cross-sectional diameter. No dissection. PULMONARY ARTERIES: No pulmonary embolism with the limits of this exam. GREAT VESSELS OF AORTIC ARCH: There is common origin of the brachiocephalic artery and the left common carotid artery. No dissection. No arterial occlusion or significant stenosis. CELIAC TRUNK: No acute finding. No occlusion or significant stenosis. SUPERIOR MESENTERIC ARTERY: No acute finding. No occlusion or significant stenosis. INFERIOR MESENTERIC ARTERY: No acute finding. No occlusion or significant stenosis. RENAL ARTERIES: No acute finding. No occlusion or significant stenosis. ILIAC ARTERIES: There is mild-to-moderate calcific plaque within the iliac arteries. No occlusion or significant stenosis. CHEST: MEDIASTINUM: No mediastinal lymphadenopathy. The heart and pericardium demonstrate no acute abnormality. LUNGS AND PLEURA: There is mild-to-moderate central lobular and paraseptal emphysema present. There is irregular reticulation present in the posterior periphery of the lower lobes bilaterally. There are a few patchy, streaky opacities present in the lateral periphery of the right middle lobe. No evidence of pleural effusion or pneumothorax. THORACIC BONES AND SOFT TISSUES: No acute bone or soft  tissue abnormality. ABDOMEN AND PELVIS: LIVER: The liver is unremarkable. GALLBLADDER AND BILE DUCTS: Gallbladder is unremarkable. No biliary ductal dilatation. SPLEEN: The spleen is unremarkable. PANCREAS: The pancreas is unremarkable. ADRENAL GLANDS: Bilateral adrenal glands demonstrate no acute abnormality. KIDNEYS, URETERS AND BLADDER: No stones in the kidneys or ureters. No hydronephrosis. No perinephric or periureteral stranding. Urinary bladder is unremarkable. GI AND BOWEL: Stomach and duodenal sweep demonstrate no acute abnormality. There is no bowel obstruction. No abnormal bowel wall thickening or distension. There are scattered colonic diverticula present, but no evidence of diverticulitis. REPRODUCTIVE: Reproductive  organs are unremarkable. PERITONEUM AND RETROPERITONEUM: No ascites or free air. LYMPH NODES: No lymphadenopathy. ABDOMINAL BONES AND SOFT TISSUES: There is mild diffuse degenerative disc disease present throughout the thoracolumbar spine. There is slight degenerative anterolisthesis at L4-5. No acute soft tissue abnormality. THYROID: There are 2 circumscribed low density lesions within the left lobe of the thyroid, with the larger lesion measuring 13 mm in diameter. IMPRESSION: 1. No evidence of acute aortic syndrome. 2. Mild-to-moderate central lobular and paraseptal emphysema. Irregular reticulation in the posterior periphery of the lower lobes bilaterally. Patchy, streaky opacities in the lateral periphery of the right middle lobe. 3. Scattered colonic diverticula without evidence of diverticulitis. Electronically signed by: evalene coho 09/20/2023 02:52 PM EDT RP Workstation: [id number]   CT Head Wo Contrast Result Date: 09/20/2023 EXAM: CT HEAD WITHOUT 09/20/2023 02:28:39 PM TECHNIQUE: CT of the head was performed without the administration of intravenous contrast. Automated exposure control, iterative reconstruction, and/or weight based adjustment of the mA/kV was utilized to  reduce the radiation dose to as low as reasonably achievable. COMPARISON: None available. CLINICAL HISTORY: Headache, new onset (Age >= 51y). Pt to ED via [facility] from home for c/o tremors that began about 45 minutes ago. Pt also c/o lower back pain. Pt has hx HTN, COPD. Pt A\T\O. FINDINGS: BRAIN AND VENTRICLES: No acute intracranial hemorrhage. No mass effect or midline shift. No extra-axial fluid collection. Gray-white differentiation is maintained. No hydrocephalus. There is a cavum septum pellucidum et vergae. There are mild vascular calcifications. There is age commensurate cerebral volume loss. ORBITS: No acute abnormality. SINUSES AND MASTOIDS: No acute abnormality. SOFT TISSUES AND SKULL: No acute skull fracture. No acute soft tissue abnormality. IMPRESSION: 1. No acute intracranial abnormality related to the clinical history of new onset headache and tremors. Electronically signed by: evalene coho 09/20/2023 02:42 PM EDT RP Workstation: [id number]   Sleep Study Documents Result Date: 09/20/2023 Ordered by an unspecified provider.  DG Chest 2 View Result Date: 09/20/2023 CLINICAL DATA:  Shortness of breath. EXAM: CHEST - 2 VIEW COMPARISON:  04/28/2021. FINDINGS: Stable mild cardiomegaly. No overt pulmonary edema, focal consolidation, pleural effusion, or pneumothorax. No acute osseous abnormality. IMPRESSION: 1. No acute cardiopulmonary findings. 2. Mild cardiomegaly. Electronically Signed   By: Harrietta Sherry M.D.   On: 09/20/2023 13:12   Sleep Study Documents Result Date: 09/17/2023 Ordered by an unspecified provider.   Microbiology: Results for orders placed or performed during the hospital encounter of 09/20/23  Blood culture (routine x 2)     Status: None (Preliminary result)   Collection Time: 09/20/23  4:01 PM   Specimen: BLOOD  Result Value Ref Range Status   Specimen Description BLOOD LEFT ANTECUBITAL  Final   Special Requests   Final    BOTTLES DRAWN AEROBIC AND ANAEROBIC  Blood Culture adequate volume   Culture   Final    NO GROWTH 2 DAYS Performed at [facility], [address], [place], [place] [address]    Report Status PENDING  Incomplete  Blood culture (routine x 2)     Status: None (Preliminary result)   Collection Time: 09/20/23  4:50 PM   Specimen: BLOOD LEFT ARM  Result Value Ref Range Status   Specimen Description   Final    BLOOD LEFT ARM Performed at [facility], [address], [place], [place] [address]    Special Requests   Final    BOTTLES DRAWN AEROBIC AND ANAEROBIC Blood Culture adequate volume   Culture  Final    NO GROWTH 2 DAYS Performed at [facility], [address] [place] [place], [place] [address]    Report Status PENDING  Incomplete    Labs: CBC: Recent Labs  Lab 09/20/23 1227 09/21/23 0504 09/22/23 0413  WBC 15.1* 16.3* 9.0  HGB 17.9* 16.3 17.0  HCT 52.5* 47.9 50.1  MCV 88.1 87.7 87.4  PLT 152 136* 142*   Basic Metabolic Panel: Recent Labs  Lab 09/20/23 1227 09/20/23 1530 09/21/23 0504 09/22/23 0413 09/22/23 1348 09/23/23 0601  NA 137  --  134* 134*  --  135  K 3.6  --  4.4 4.6  --  3.9  CL 102  --  103 107  --  104  CO2 26  --  21* 20*  --  24  GLUCOSE 99  --  102* 130*  --  111*  BUN 10  --  12 17  --  19  CREATININE 1.23  --  1.15 1.23  --  0.99  CALCIUM 9.5  --  9.1 9.6  --  9.3  MG  --  1.8  --  2.3  --  2.4  PHOS  --  1.1*  --  1.0* 2.8 2.9   Liver Function Tests: Recent Labs  Lab 09/20/23 1227 09/21/23 0504  AST 27 19  ALT 21 16  ALKPHOS 48 36*  BILITOT 1.0 1.5*  PROT 7.3 7.1  ALBUMIN  3.6 3.5   CBG: No results for input(s): GLUCAP in the last 168 hours.  Discharge time spent: greater than 30 minutes.  Signed: Aimee Somerset, MD [facility] 09/23/2023

## 2023-09-23 NOTE — Progress Notes (Signed)
 PHARMACY CONSULT NOTE - ELECTROLYTES  Pharmacy Consult for Electrolyte Monitoring and Replacement   Recent Labs: Height: 6' 1 (185.4 cm) Weight: 133.8 kg (295 lb) IBW/kg (Calculated) : 79.9 Estimated Creatinine Clearance: 91.1 mL/min (by C-G formula based on SCr of 0.99 mg/dL). Potassium (mmol/L)  Date Value  09/23/2023 3.9   Magnesium  (mg/dL)  Date Value  91/95/7974 2.4   Calcium (mg/dL)  Date Value  91/95/7974 9.3   Albumin  (g/dL)  Date Value  91/97/7974 3.5  04/15/2018 4.2   Phosphorus (mg/dL)  Date Value  91/95/7974 2.9   Sodium (mmol/L)  Date Value  09/23/2023 135  04/15/2018 139    Assessment  Zachary Sweeney is a 78 y.o. male presenting with dyspnea and tremors found to have acute respiratory failure. PMH significant for GERD, osteoarthritis, colon polyps, lower extremity DVT, lung emphysema, erythrocytosis, hypertension, sleep apnea . Pharmacy has been consulted to monitor and replace electrolytes.  Diet: heart healthy MIVF: N/A Pertinent medications: N/A  Goal of Therapy: Electrolytes WNL  Plan:  No electrolyte replacement indicated for today Check BMP, Mg, Phos with AM labs  Thank you for allowing pharmacy to be a part of this patient's care.  Zachary Sweeney, PharmD Clinical Pharmacist 09/23/2023 7:46 AM

## 2023-09-23 NOTE — Plan of Care (Signed)
  Problem: Elimination: Goal: Will not experience complications related to urinary retention Outcome: Progressing   Problem: Pain Managment: Goal: General experience of comfort will improve and/or be controlled Outcome: Progressing   Problem: Respiratory: Goal: Ability to maintain adequate ventilation will improve Outcome: Progressing

## 2023-09-25 LAB — CULTURE, BLOOD (ROUTINE X 2)
Culture: NO GROWTH
Culture: NO GROWTH
Special Requests: ADEQUATE
Special Requests: ADEQUATE

## 2023-10-23 ENCOUNTER — Inpatient Hospital Stay: Attending: Oncology

## 2023-10-23 ENCOUNTER — Encounter: Payer: Self-pay | Admitting: Oncology

## 2023-10-23 ENCOUNTER — Inpatient Hospital Stay (HOSPITAL_BASED_OUTPATIENT_CLINIC_OR_DEPARTMENT_OTHER): Admitting: Oncology

## 2023-10-23 VITALS — BP 119/74 | HR 69 | Temp 97.7°F | Resp 16 | Wt 287.0 lb

## 2023-10-23 DIAGNOSIS — Z8042 Family history of malignant neoplasm of prostate: Secondary | ICD-10-CM | POA: Insufficient documentation

## 2023-10-23 DIAGNOSIS — Z860100 Personal history of colon polyps, unspecified: Secondary | ICD-10-CM | POA: Insufficient documentation

## 2023-10-23 DIAGNOSIS — Z801 Family history of malignant neoplasm of trachea, bronchus and lung: Secondary | ICD-10-CM | POA: Insufficient documentation

## 2023-10-23 DIAGNOSIS — K429 Umbilical hernia without obstruction or gangrene: Secondary | ICD-10-CM | POA: Diagnosis not present

## 2023-10-23 DIAGNOSIS — D751 Secondary polycythemia: Secondary | ICD-10-CM | POA: Insufficient documentation

## 2023-10-23 DIAGNOSIS — G8929 Other chronic pain: Secondary | ICD-10-CM | POA: Insufficient documentation

## 2023-10-23 DIAGNOSIS — M7989 Other specified soft tissue disorders: Secondary | ICD-10-CM | POA: Insufficient documentation

## 2023-10-23 DIAGNOSIS — Z79899 Other long term (current) drug therapy: Secondary | ICD-10-CM | POA: Insufficient documentation

## 2023-10-23 DIAGNOSIS — R600 Localized edema: Secondary | ICD-10-CM | POA: Diagnosis not present

## 2023-10-23 DIAGNOSIS — Z8249 Family history of ischemic heart disease and other diseases of the circulatory system: Secondary | ICD-10-CM | POA: Insufficient documentation

## 2023-10-23 DIAGNOSIS — N200 Calculus of kidney: Secondary | ICD-10-CM | POA: Diagnosis not present

## 2023-10-23 DIAGNOSIS — G4730 Sleep apnea, unspecified: Secondary | ICD-10-CM | POA: Diagnosis not present

## 2023-10-23 DIAGNOSIS — Z86718 Personal history of other venous thrombosis and embolism: Secondary | ICD-10-CM

## 2023-10-23 DIAGNOSIS — M79671 Pain in right foot: Secondary | ICD-10-CM | POA: Diagnosis not present

## 2023-10-23 DIAGNOSIS — K439 Ventral hernia without obstruction or gangrene: Secondary | ICD-10-CM | POA: Insufficient documentation

## 2023-10-23 DIAGNOSIS — I700 Atherosclerosis of aorta: Secondary | ICD-10-CM | POA: Insufficient documentation

## 2023-10-23 DIAGNOSIS — Z87891 Personal history of nicotine dependence: Secondary | ICD-10-CM | POA: Insufficient documentation

## 2023-10-23 DIAGNOSIS — K5730 Diverticulosis of large intestine without perforation or abscess without bleeding: Secondary | ICD-10-CM | POA: Insufficient documentation

## 2023-10-23 DIAGNOSIS — Z7901 Long term (current) use of anticoagulants: Secondary | ICD-10-CM | POA: Diagnosis not present

## 2023-10-23 DIAGNOSIS — K635 Polyp of colon: Secondary | ICD-10-CM | POA: Diagnosis not present

## 2023-10-23 DIAGNOSIS — I872 Venous insufficiency (chronic) (peripheral): Secondary | ICD-10-CM | POA: Diagnosis not present

## 2023-10-23 LAB — CBC WITH DIFFERENTIAL (CANCER CENTER ONLY)
Abs Immature Granulocytes: 0.02 K/uL (ref 0.00–0.07)
Basophils Absolute: 0 K/uL (ref 0.0–0.1)
Basophils Relative: 1 %
Eosinophils Absolute: 0.1 K/uL (ref 0.0–0.5)
Eosinophils Relative: 2 %
HCT: 52.3 % — ABNORMAL HIGH (ref 39.0–52.0)
Hemoglobin: 17.8 g/dL — ABNORMAL HIGH (ref 13.0–17.0)
Immature Granulocytes: 0 %
Lymphocytes Relative: 29 %
Lymphs Abs: 1.8 K/uL (ref 0.7–4.0)
MCH: 29.8 pg (ref 26.0–34.0)
MCHC: 34 g/dL (ref 30.0–36.0)
MCV: 87.6 fL (ref 80.0–100.0)
Monocytes Absolute: 0.5 K/uL (ref 0.1–1.0)
Monocytes Relative: 8 %
Neutro Abs: 3.8 K/uL (ref 1.7–7.7)
Neutrophils Relative %: 60 %
Platelet Count: 156 K/uL (ref 150–400)
RBC: 5.97 MIL/uL — ABNORMAL HIGH (ref 4.22–5.81)
RDW: 13.6 % (ref 11.5–15.5)
WBC Count: 6.3 K/uL (ref 4.0–10.5)
nRBC: 0 % (ref 0.0–0.2)

## 2023-10-23 NOTE — Progress Notes (Signed)
 Hematology/Oncology Progress note [phone] [phone]      Patient Care Team: Odell Chard, Edra GRADE, MD as PCP - General (Family Medicine) End, Lonni, MD as PCP - Cardiology (Cardiology) Babara Call, MD as Consulting Physician (Oncology)   ASSESSMENT & PLAN:   History of DVT of lower extremity #History of recurrent DVT. Continue Eliquis  2.5 mg twice daily for maintenance.  He tolerates well.  Prescription refilled. Chronic lower extremity edema secondary to venous insufficiency.   Continue compression stocking.  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 JAK2 V617F mutation negative, with reflex to other mutations CALR, MPL, JAK 2 Ex 12-15 mutations negative. Negative BCR ABL1 FISH, less likely primary erythrocytosis.  He reports increased compliance with CPAP, the setting was recently changed No need for phlebotomy at this point.   Orders Placed This Encounter  Procedures   CMP (Cancer Center only)    Standing Status:   Future    Expected Date:   02/22/2024    Expiration Date:   05/22/2024   CBC with Differential (Cancer Center Only)    Standing Status:   Future    Expected Date:   02/22/2024    Expiration Date:   05/22/2024   Follow up in 4 months.   All questions were answered. The patient knows to call the clinic with any problems, questions or concerns.  Call Babara, MD, PhD [facility] Hematology Oncology 10/23/2023   CHIEF COMPLAINTS/REASON FOR VISIT:  Follow-up for DVT  HISTORY OF PRESENTING ILLNESS:  Zachary Sweeney is a  78 y.o.  male presents for DVT, chronic anticoagulation.  Patient recently established care with gastroenterology for consultation and management of chronic history of diffuse abdominal pain as well as epigastric pain.  Not related to meals, exacerbated when he bends down.  Not associated with nausea or vomiting. Denies any weight loss, fever, chills, night sweats.  Appetite is fair. Denies  hematochezia, hematuria, hematemesis, epistaxis, black tarry stool or easy bruising.  Endoscopy procedures were recommended for further evaluation.  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  Patient was referred by gastroenterology to me for further evaluation and peri-procedure anticoagulation recommendation. Patient also was found to have an abdominal wall hernia which caused this pain with bending down.  He was also referred to see surgery Dr. Desiderio for further evaluation.  06/25/2017, patient had US  venous bilateral lower extremity which showed a focal area of partial compressibility popliteal Vein on the right, compatible with a partially occlusive DVT.  The borders are smooth and this has a subacute appearance. Left lower extremity no DVT.   Patient recalls that he had right foot pain and swelling, as the initial presentation which prompted ultrasound evaluation. Patient was started on Eliquis  after ED discussed his case with vascular surgery Dr. Marea. Patient was taken off Eliquis  in November 2019. Presented to [facility] emergency room on 03/30/2018 complaining right lower extremity pain and swelling again which is similar to when he had a blood clot.  Patient was concerned that he could have developed blood clots again so he restarted taking Eliquis  for the past week prior to presenting to the emergency room.  He mentions to a friend at the church and they urged him to go to emergency room. In the ED, ultrasound showed nonocclusive right lower extremity DVT. 04/10/2018, he had CT abdomen pelvis done for evaluation of chronic abdominal pain.  CT showed no acute findings in the abdomen and pelvis.  Nonobstructive punctate stones  over the lower pole collecting system of the right kidney.  Small umbilical hernia containing only peritoneal fat.  Aortic atherosclerosis.  Patient reports feeling well today.  Right lower extremity pain and swelling has improved.  He is taking  Eliquis  5 mg twice daily since February 2020.  Tolerates well.  No bleeding events.  #  GI work-up done on 07/24/2018. Colonoscopy showed colon polyps which were resected and retrieved.  Diverticulosis.  Biopsy showed tubular adenoma.  Recommend repeat colonoscopy in 3 years negative for high-grade dysplasia and malignancy. EGD showed salmon-colored mucosa suggestive of Barrett's esophagus.  Biopsied.  Per GI note, biopsy showed evidence of acid reflux.  Patient was started on Pepcid .  #  07/10/2018 US  venous lower extremity unilateral right findings compatible with resolving isolated right popliteal DVT. Recommend patient to be switched to Eliquis  2.5 mg twice daily for long-term anticoagulation maintenance.  # sleep Apnea  05/04/2022, patient had a colonoscopy, patient was found to have multiple colon polyps, pathology were negative for high-grade dysplastic malignancy.  Hyper plastic polyp, tubular adenoma and sessile serrated polyps. Patient denies any family history of colon cancer.  Maternal uncle was diagnosed with prostate cancer.  INTERVAL HISTORY Zachary Sweeney is a 78 y.o. male who has above history reviewed by me today presents for follow up visit for DVT and erythrocytosis.  Patient was last seen 1 year ago and presents to establish care. Problems and complaints are listed below: He takes eliquis  2.5mg  BID.  Patient denies any bleeding events Continues to have intermittent right lower extremity swelling which is chronic. Sleep apnea, he uses CPAP, on most days.     Review of Systems  Constitutional:  Negative for appetite change, chills, fatigue, fever and unexpected weight change.  HENT:   Negative for hearing loss and voice change.   Eyes:  Negative for eye problems and icterus.  Respiratory:  Positive for shortness of breath. Negative for chest tightness and cough.   Cardiovascular:  Positive for leg swelling. Negative for chest pain.  Gastrointestinal:  Negative for  abdominal distention, abdominal pain, nausea and vomiting.  Endocrine: Negative for hot flashes.  Genitourinary:  Negative for difficulty urinating, dysuria and frequency.   Musculoskeletal:  Negative for arthralgias.  Skin:  Negative for itching and rash.  Neurological:  Negative for light-headedness and numbness.  Hematological:  Negative for adenopathy. Does not bruise/bleed easily.  Psychiatric/Behavioral:  Negative for confusion.     MEDICAL HISTORY:  Past Medical History:  Diagnosis Date   Acid reflux    Arthritis    Back injury    Colon polyp    DVT (deep venous thrombosis) (HCC)    a. 06/2017 LE U/S: Right popliteal vein DVT.   Dyspnea on exertion    a. 06/2017 CTA Chest: No PE. No significant coronary Ca2+, centrilobular and paraseptal emphysema;  b.  07/2017 Echo: EF 60-65%, no rwma, Gr1 DD, mildly dil LA. Nl RV fxn; c. 07/2017 MV: Small, severe defect involving apical inf and apical segments - only on rest images consistent w/ artifact. No ischemia or scar.   Emphysema of lung (HCC)    a. 06/2017 CTA Chest: Centrilobular and paraseptal emphysema, with mild geographic ground-glass opacity likely representing small airway dzs.   Erythrocytosis 04/17/2019   Hypertension    Sleep apnea    Torn ligament     SURGICAL HISTORY: Past Surgical History:  Procedure Laterality Date   COLONOSCOPY  2008   COLONOSCOPY N/A 05/04/2022   Procedure: COLONOSCOPY;  Surgeon: Onita Elspeth Sharper, DO;  Location: [facility] ENDOSCOPY;  Service: Gastroenterology;  Laterality: N/A;   COLONOSCOPY WITH PROPOFOL  N/A 02/27/2017   Procedure: COLONOSCOPY WITH PROPOFOL ;  Surgeon: Dessa Reyes ORN, MD;  Location: [facility] ENDOSCOPY;  Service: Endoscopy;  Laterality: N/A;   COLONOSCOPY WITH PROPOFOL  N/A 07/24/2018   Procedure: COLONOSCOPY WITH PROPOFOL ;  Surgeon: Janalyn Keene NOVAK, MD;  Location: [facility] ENDOSCOPY;  Service: Endoscopy;  Laterality: N/A;   ESOPHAGOGASTRODUODENOSCOPY (EGD) WITH PROPOFOL  N/A 02/27/2017    Procedure: ESOPHAGOGASTRODUODENOSCOPY (EGD) WITH PROPOFOL ;  Surgeon: Dessa Reyes ORN, MD;  Location: [facility] ENDOSCOPY;  Service: Endoscopy;  Laterality: N/A;   ESOPHAGOGASTRODUODENOSCOPY (EGD) WITH PROPOFOL  N/A 07/24/2018   Procedure: ESOPHAGOGASTRODUODENOSCOPY (EGD) WITH PROPOFOL ;  Surgeon: Janalyn Keene NOVAK, MD;  Location: [facility] ENDOSCOPY;  Service: Endoscopy;  Laterality: N/A;   HERNIA REPAIR Right    inguinal   INSERTION OF MESH  02/01/2021   Procedure: INSERTION OF MESH;  Surgeon: Rodolph Romano, MD;  Location: [facility] ORS;  Service: General;;   KNEE SURGERY Left    NECK SURGERY     ROTATOR CUFF REPAIR Right    SHOULDER ARTHROSCOPY WITH SUBACROMIAL DECOMPRESSION, ROTATOR CUFF REPAIR AND BICEP TENDON REPAIR Left 07/25/2021   Procedure: LEFT SHOULDER ARTHROSCOPY WITH DEBRIDEMENT, DECOMPRESSION, DISTAL CLAVICLE EXCISION, BICEPS TENODESIS;  Surgeon: Edie Norleen PARAS, MD;  Location: [facility] ORS;  Service: Orthopedics;  Laterality: Left;   UMBILICAL HERNIA REPAIR N/A 04/22/2019   Procedure: HERNIA REPAIR UMBILICAL ADULT;  Surgeon: Lane Shope, MD;  Location: [facility] ORS;  Service: General;  Laterality: N/A;    SOCIAL HISTORY: Social History   Socioeconomic History   Marital status: Single    Spouse name: Not on file   Number of children: Not on file   Years of education: Not on file   Highest education level: Not on file  Occupational History   Occupation: retired  Tobacco Use   Smoking status: Former    Current packs/day: 0.00    Average packs/day: 0.3 packs/day for 30.0 years (7.5 ttl pk-yrs)    Types: Cigarettes    Start date: 02/20/1964    Quit date: 02/19/1994    Years since quitting: 29.6   Smokeless tobacco: Never  Vaping Use   Vaping status: Never Used  Substance and Sexual Activity   Alcohol use: No   Drug use: No   Sexual activity: Not on file  Other Topics Concern   Not on file  Social History Narrative   Lives alone   Social Drivers of Health   Financial Resource  Strain: Not on file  Food Insecurity: No Food Insecurity (09/20/2023)   Hunger Vital Sign    Worried About Running Out of Food in the Last Year: Never true    Ran Out of Food in the Last Year: Never true  Transportation Needs: No Transportation Needs (09/20/2023)   PRAPARE - Administrator, Civil Service (Medical): No    Lack of Transportation (Non-Medical): No  Physical Activity: Not on file  Stress: Not on file  Social Connections: Unknown (09/20/2023)   Social Connection and Isolation Panel    Frequency of Communication with Friends and Family: More than three times a week    Frequency of Social Gatherings with Friends and Family: More than three times a week    Attends Religious Services: More than 4 times per year    Active Member of [employer] or Organizations: Yes    Attends Banker Meetings: More than 4 times per year  Marital Status: Patient declined  Intimate Partner Violence: Not At Risk (09/20/2023)   Humiliation, Afraid, Rape, and Kick questionnaire    Fear of Current or Ex-Partner: No    Emotionally Abused: No    Physically Abused: No    Sexually Abused: No    FAMILY HISTORY: Family History  Problem Relation Age of Onset   Lung cancer Mother    Heart disease Father    Prostate cancer Maternal Uncle     ALLERGIES:  has no known allergies.  MEDICATIONS:  Current Outpatient Medications  Medication Sig Dispense Refill   acetaminophen  (TYLENOL ) 500 MG tablet Take 1,000 mg by mouth every 6 (six) hours as needed for fever.     albuterol  (VENTOLIN  HFA) 108 (90 Base) MCG/ACT inhaler 2 puffs every 4 (four) hours as needed for shortness of breath or wheezing.     apixaban  (ELIQUIS ) 2.5 MG TABS tablet Take 1 tablet (2.5 mg total) by mouth 2 (two) times daily. 200 tablet 1   clobetasol cream (TEMOVATE) 0.05 %      diltiazem  (CARDIZEM  CD) 300 MG 24 hr capsule Take 1 capsule (300 mg total) by mouth daily. 90 capsule 3   fluocinonide (LIDEX) 0.05 % external  solution Apply 1 application topically daily.     JARDIANCE 10 MG TABS tablet Take 10 mg by mouth daily.     ketoconazole (NIZORAL) 2 % cream Apply topically daily.     losartan  (COZAAR ) 50 MG tablet Take 1 tablet (50 mg total) by mouth daily. 30 tablet 0   omeprazole  (PRILOSEC) 20 MG capsule Take 20 mg by mouth daily.     umeclidinium-vilanterol (ANORO ELLIPTA ) 62.5-25 MCG/INH AEPB Inhale 1 puff into the lungs daily.     VIAGRA 50 MG tablet SMARTSIG:1 Tablet(s) By Mouth     No current facility-administered medications for this visit.     PHYSICAL EXAMINATION:  Vitals:   10/23/23 1414  BP: 119/74  Pulse: 69  Resp: 16  Temp: 97.7 F (36.5 C)  SpO2: 96%   Filed Weights   10/23/23 1414  Weight: 287 lb (130.2 kg)    Physical Exam Constitutional:      General: He is not in acute distress.    Appearance: He is obese.  HENT:     Head: Normocephalic and atraumatic.  Eyes:     General: No scleral icterus.    Pupils: Pupils are equal, round, and reactive to light.  Cardiovascular:     Rate and Rhythm: Normal rate and regular rhythm.     Heart sounds: Normal heart sounds.  Pulmonary:     Effort: Pulmonary effort is normal. No respiratory distress.     Breath sounds: No wheezing.  Abdominal:     General: Bowel sounds are normal. There is no distension.     Palpations: Abdomen is soft. There is no mass.     Tenderness: There is no abdominal tenderness.  Musculoskeletal:        General: No deformity. Normal range of motion.     Cervical back: Normal range of motion and neck supple.     Comments: Trace bilateral lower extremity edema.   Skin:    General: Skin is warm and dry.     Findings: No erythema or rash.  Neurological:     Mental Status: He is alert and oriented to person, place, and time. Mental status is at baseline.     Cranial Nerves: No cranial nerve deficit.     Coordination: Coordination normal.  Psychiatric:        Mood and Affect: Mood normal.         Behavior: Behavior normal.        Thought Content: Thought content normal.     RADIOGRAPHIC STUDIES: I have personally reviewed the radiological images as listed and agreed with the findings in the report. CT Angio Chest/Abd/Pel for Dissection W and/or Wo Contrast Result Date: 09/20/2023 EXAM: CTA CHEST, ABDOMEN AND PELVIS WITHOUT AND WITH CONTRAST 09/20/2023 02:28:39 PM TECHNIQUE: CTA of the chest was performed without and with the administration of intravenous contrast. CTA of the abdomen and pelvis was performed without and with the administration of intravenous contrast. Multiplanar reformatted images are provided for review. MIP images are provided for review. Automated exposure control, iterative reconstruction, and/or weight based adjustment of the mA/kV was utilized to reduce the radiation dose to as low as reasonably achievable. COMPARISON: CT of the abdomen and pelvis dated 04/25/2021. CLINICAL HISTORY: Acute aortic syndrome (AAS) suspected. Pt to ED via [facility] from home for c/o tremors that began about 45 minutes ago. Pt also c/o lower back pain. Pt has hx HTN, COPD. Pt A\T\O. FINDINGS: VASCULATURE: AORTA: The ascending thoracic aorta is mildly ectatic measuring approximately 3.8 x 3.7 cm in cross-sectional diameter. No dissection. PULMONARY ARTERIES: No pulmonary embolism with the limits of this exam. GREAT VESSELS OF AORTIC ARCH: There is common origin of the brachiocephalic artery and the left common carotid artery. No dissection. No arterial occlusion or significant stenosis. CELIAC TRUNK: No acute finding. No occlusion or significant stenosis. SUPERIOR MESENTERIC ARTERY: No acute finding. No occlusion or significant stenosis. INFERIOR MESENTERIC ARTERY: No acute finding. No occlusion or significant stenosis. RENAL ARTERIES: No acute finding. No occlusion or significant stenosis. ILIAC ARTERIES: There is mild-to-moderate calcific plaque within the iliac arteries. No occlusion or significant  stenosis. CHEST: MEDIASTINUM: No mediastinal lymphadenopathy. The heart and pericardium demonstrate no acute abnormality. LUNGS AND PLEURA: There is mild-to-moderate central lobular and paraseptal emphysema present. There is irregular reticulation present in the posterior periphery of the lower lobes bilaterally. There are a few patchy, streaky opacities present in the lateral periphery of the right middle lobe. No evidence of pleural effusion or pneumothorax. THORACIC BONES AND SOFT TISSUES: No acute bone or soft tissue abnormality. ABDOMEN AND PELVIS: LIVER: The liver is unremarkable. GALLBLADDER AND BILE DUCTS: Gallbladder is unremarkable. No biliary ductal dilatation. SPLEEN: The spleen is unremarkable. PANCREAS: The pancreas is unremarkable. ADRENAL GLANDS: Bilateral adrenal glands demonstrate no acute abnormality. KIDNEYS, URETERS AND BLADDER: No stones in the kidneys or ureters. No hydronephrosis. No perinephric or periureteral stranding. Urinary bladder is unremarkable. GI AND BOWEL: Stomach and duodenal sweep demonstrate no acute abnormality. There is no bowel obstruction. No abnormal bowel wall thickening or distension. There are scattered colonic diverticula present, but no evidence of diverticulitis. REPRODUCTIVE: Reproductive organs are unremarkable. PERITONEUM AND RETROPERITONEUM: No ascites or free air. LYMPH NODES: No lymphadenopathy. ABDOMINAL BONES AND SOFT TISSUES: There is mild diffuse degenerative disc disease present throughout the thoracolumbar spine. There is slight degenerative anterolisthesis at L4-5. No acute soft tissue abnormality. THYROID: There are 2 circumscribed low density lesions within the left lobe of the thyroid, with the larger lesion measuring 13 mm in diameter. IMPRESSION: 1. No evidence of acute aortic syndrome. 2. Mild-to-moderate central lobular and paraseptal emphysema. Irregular reticulation in the posterior periphery of the lower lobes bilaterally. Patchy, streaky  opacities in the lateral periphery of the right middle lobe. 3. Scattered colonic diverticula without evidence  of diverticulitis. Electronically signed by: evalene coho 09/20/2023 02:52 PM EDT RP Workstation: [id number]   CT Head Wo Contrast Result Date: 09/20/2023 EXAM: CT HEAD WITHOUT 09/20/2023 02:28:39 PM TECHNIQUE: CT of the head was performed without the administration of intravenous contrast. Automated exposure control, iterative reconstruction, and/or weight based adjustment of the mA/kV was utilized to reduce the radiation dose to as low as reasonably achievable. COMPARISON: None available. CLINICAL HISTORY: Headache, new onset (Age >= 51y). Pt to ED via [facility] from home for c/o tremors that began about 45 minutes ago. Pt also c/o lower back pain. Pt has hx HTN, COPD. Pt A\T\O. FINDINGS: BRAIN AND VENTRICLES: No acute intracranial hemorrhage. No mass effect or midline shift. No extra-axial fluid collection. Gray-white differentiation is maintained. No hydrocephalus. There is a cavum septum pellucidum et vergae. There are mild vascular calcifications. There is age commensurate cerebral volume loss. ORBITS: No acute abnormality. SINUSES AND MASTOIDS: No acute abnormality. SOFT TISSUES AND SKULL: No acute skull fracture. No acute soft tissue abnormality. IMPRESSION: 1. No acute intracranial abnormality related to the clinical history of new onset headache and tremors. Electronically signed by: evalene coho 09/20/2023 02:42 PM EDT RP Workstation: [id number]   Sleep Study Documents Result Date: 09/20/2023 Ordered by an unspecified provider.  DG Chest 2 View Result Date: 09/20/2023 CLINICAL DATA:  Shortness of breath. EXAM: CHEST - 2 VIEW COMPARISON:  04/28/2021. FINDINGS: Stable mild cardiomegaly. No overt pulmonary edema, focal consolidation, pleural effusion, or pneumothorax. No acute osseous abnormality. IMPRESSION: 1. No acute cardiopulmonary findings. 2. Mild cardiomegaly. Electronically Signed    By: Harrietta Sherry M.D.   On: 09/20/2023 13:12   Sleep Study Documents Result Date: 09/17/2023 Ordered by an unspecified provider.      LABORATORY DATA:  I have reviewed the data as listed    Latest Ref Rng & Units 10/23/2023    1:32 PM 09/22/2023    4:13 AM 09/21/2023    5:04 AM  CBC  WBC 4.0 - 10.5 K/uL 6.3  9.0  16.3   Hemoglobin 13.0 - 17.0 g/dL 82.1  82.9  83.6   Hematocrit 39.0 - 52.0 % 52.3  50.1  47.9   Platelets 150 - 400 K/uL 156  142  136       Latest Ref Rng & Units 09/23/2023    6:01 AM 09/22/2023    4:13 AM 09/21/2023    5:04 AM  CMP  Glucose 70 - 99 mg/dL 888  869  897   BUN 8 - 23 mg/dL 19  17  12    Creatinine 0.61 - 1.24 mg/dL 9.00  8.76  8.84   Sodium 135 - 145 mmol/L 135  134  134   Potassium 3.5 - 5.1 mmol/L 3.9  4.6  4.4   Chloride 98 - 111 mmol/L 104  107  103   CO2 22 - 32 mmol/L 24  20  21    Calcium 8.9 - 10.3 mg/dL 9.3  9.6  9.1   Total Protein 6.5 - 8.1 g/dL   7.1   Total Bilirubin 0.0 - 1.2 mg/dL   1.5   Alkaline Phos 38 - 126 U/L   36   AST 15 - 41 U/L   19   ALT 0 - 44 U/L   16

## 2023-10-23 NOTE — Assessment & Plan Note (Signed)
 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 JAK2 V617F mutation negative, with reflex to other mutations CALR, MPL, JAK 2 Ex 12-15 mutations negative. Negative BCR ABL1 FISH, less likely primary erythrocytosis.  He reports increased compliance with CPAP, the setting was recently changed No need for phlebotomy at this point.

## 2023-10-23 NOTE — Assessment & Plan Note (Signed)
#  History of recurrent DVT. Continue Eliquis 2.5 mg twice daily for maintenance.  He tolerates well.  Prescription refilled. Chronic lower extremity edema secondary to venous insufficiency.  Continue compression stocking. 

## 2024-01-24 ENCOUNTER — Encounter: Payer: Self-pay | Admitting: General Surgery

## 2024-02-26 ENCOUNTER — Inpatient Hospital Stay: Admitting: Oncology

## 2024-02-26 ENCOUNTER — Encounter: Payer: Self-pay | Admitting: Oncology

## 2024-02-26 ENCOUNTER — Inpatient Hospital Stay: Attending: Family Medicine
